# Patient Record
Sex: Female | Born: 1965 | Race: White | Hispanic: Yes | Marital: Single | Smoking: Former smoker
Health system: Southern US, Community
[De-identification: ages and names within clinical notes are randomized; demographics above are authoritative.]

## PROBLEM LIST (undated history)

## (undated) DIAGNOSIS — I10 Essential (primary) hypertension: Secondary | ICD-10-CM

## (undated) DIAGNOSIS — E7800 Pure hypercholesterolemia, unspecified: Secondary | ICD-10-CM

## (undated) DIAGNOSIS — O039 Complete or unspecified spontaneous abortion without complication: Secondary | ICD-10-CM

## (undated) DIAGNOSIS — L0291 Cutaneous abscess, unspecified: Secondary | ICD-10-CM

## (undated) DIAGNOSIS — E119 Type 2 diabetes mellitus without complications: Secondary | ICD-10-CM

## (undated) DIAGNOSIS — Z9109 Other allergy status, other than to drugs and biological substances: Secondary | ICD-10-CM

## (undated) HISTORY — PX: BREAST BIOPSY: SHX20

## (undated) HISTORY — PX: ABSCESS DRAINAGE: SHX1119

## (undated) HISTORY — DX: Complete or unspecified spontaneous abortion without complication: O03.9

---

## 2001-04-28 ENCOUNTER — Encounter: Admission: RE | Admit: 2001-04-28 | Discharge: 2001-04-28 | Payer: Self-pay | Admitting: Obstetrics & Gynecology

## 2003-07-24 ENCOUNTER — Encounter (INDEPENDENT_AMBULATORY_CARE_PROVIDER_SITE_OTHER): Payer: Self-pay | Admitting: Specialist

## 2003-07-24 ENCOUNTER — Encounter: Admission: RE | Admit: 2003-07-24 | Discharge: 2003-07-24 | Payer: Self-pay | Admitting: Obstetrics and Gynecology

## 2003-07-24 ENCOUNTER — Other Ambulatory Visit: Admission: RE | Admit: 2003-07-24 | Discharge: 2003-07-24 | Payer: Self-pay | Admitting: Obstetrics & Gynecology

## 2003-11-14 ENCOUNTER — Emergency Department (HOSPITAL_COMMUNITY): Admission: EM | Admit: 2003-11-14 | Discharge: 2003-11-14 | Payer: Self-pay | Admitting: Emergency Medicine

## 2003-11-27 ENCOUNTER — Emergency Department (HOSPITAL_COMMUNITY): Admission: EM | Admit: 2003-11-27 | Discharge: 2003-11-27 | Payer: Self-pay | Admitting: Emergency Medicine

## 2004-08-11 ENCOUNTER — Ambulatory Visit: Payer: Self-pay | Admitting: Internal Medicine

## 2004-08-12 ENCOUNTER — Ambulatory Visit: Payer: Self-pay | Admitting: *Deleted

## 2004-12-08 ENCOUNTER — Ambulatory Visit: Payer: Self-pay | Admitting: Internal Medicine

## 2004-12-17 ENCOUNTER — Ambulatory Visit: Payer: Self-pay | Admitting: Family Medicine

## 2005-01-27 ENCOUNTER — Ambulatory Visit (HOSPITAL_COMMUNITY): Admission: RE | Admit: 2005-01-27 | Discharge: 2005-01-27 | Payer: Self-pay | Admitting: General Surgery

## 2005-01-27 ENCOUNTER — Encounter (INDEPENDENT_AMBULATORY_CARE_PROVIDER_SITE_OTHER): Payer: Self-pay | Admitting: *Deleted

## 2005-02-10 ENCOUNTER — Ambulatory Visit: Payer: Self-pay | Admitting: Internal Medicine

## 2005-05-04 ENCOUNTER — Encounter: Admission: RE | Admit: 2005-05-04 | Discharge: 2005-05-04 | Payer: Self-pay | Admitting: General Surgery

## 2005-12-07 ENCOUNTER — Ambulatory Visit: Payer: Self-pay | Admitting: Family Medicine

## 2005-12-21 ENCOUNTER — Ambulatory Visit: Payer: Self-pay | Admitting: Family Medicine

## 2006-03-09 ENCOUNTER — Ambulatory Visit: Payer: Self-pay | Admitting: Internal Medicine

## 2006-05-11 ENCOUNTER — Ambulatory Visit: Payer: Self-pay | Admitting: Internal Medicine

## 2006-05-12 ENCOUNTER — Ambulatory Visit (HOSPITAL_COMMUNITY): Admission: RE | Admit: 2006-05-12 | Discharge: 2006-05-12 | Payer: Self-pay | Admitting: Internal Medicine

## 2006-05-18 ENCOUNTER — Ambulatory Visit: Payer: Self-pay | Admitting: Internal Medicine

## 2006-06-17 ENCOUNTER — Ambulatory Visit: Payer: Self-pay | Admitting: Obstetrics & Gynecology

## 2006-08-22 ENCOUNTER — Emergency Department (HOSPITAL_COMMUNITY): Admission: EM | Admit: 2006-08-22 | Discharge: 2006-08-22 | Payer: Self-pay | Admitting: *Deleted

## 2006-10-01 ENCOUNTER — Ambulatory Visit: Payer: Self-pay | Admitting: Internal Medicine

## 2006-10-29 ENCOUNTER — Ambulatory Visit: Payer: Self-pay | Admitting: Internal Medicine

## 2006-11-05 ENCOUNTER — Ambulatory Visit: Payer: Self-pay | Admitting: Gynecology

## 2006-11-25 ENCOUNTER — Ambulatory Visit: Payer: Self-pay | Admitting: Internal Medicine

## 2007-01-31 ENCOUNTER — Ambulatory Visit: Payer: Self-pay | Admitting: Internal Medicine

## 2007-02-02 ENCOUNTER — Ambulatory Visit: Payer: Self-pay | Admitting: Obstetrics and Gynecology

## 2007-02-23 ENCOUNTER — Encounter (INDEPENDENT_AMBULATORY_CARE_PROVIDER_SITE_OTHER): Payer: Self-pay | Admitting: *Deleted

## 2007-03-14 ENCOUNTER — Ambulatory Visit: Payer: Self-pay | Admitting: Internal Medicine

## 2007-03-15 ENCOUNTER — Ambulatory Visit (HOSPITAL_COMMUNITY): Admission: RE | Admit: 2007-03-15 | Discharge: 2007-03-15 | Payer: Self-pay | Admitting: Internal Medicine

## 2007-03-16 ENCOUNTER — Ambulatory Visit: Payer: Self-pay | Admitting: Internal Medicine

## 2007-04-18 ENCOUNTER — Inpatient Hospital Stay (HOSPITAL_COMMUNITY): Admission: AD | Admit: 2007-04-18 | Discharge: 2007-04-18 | Payer: Self-pay | Admitting: Obstetrics & Gynecology

## 2007-04-18 ENCOUNTER — Ambulatory Visit: Payer: Self-pay | Admitting: Internal Medicine

## 2007-05-17 ENCOUNTER — Ambulatory Visit (HOSPITAL_COMMUNITY): Admission: RE | Admit: 2007-05-17 | Discharge: 2007-05-17 | Payer: Self-pay | Admitting: Family Medicine

## 2007-06-08 ENCOUNTER — Ambulatory Visit: Payer: Self-pay | Admitting: Internal Medicine

## 2007-11-24 ENCOUNTER — Ambulatory Visit: Payer: Self-pay | Admitting: Internal Medicine

## 2008-02-08 ENCOUNTER — Ambulatory Visit: Payer: Self-pay | Admitting: Internal Medicine

## 2008-02-21 ENCOUNTER — Ambulatory Visit: Payer: Self-pay | Admitting: Internal Medicine

## 2008-06-29 ENCOUNTER — Ambulatory Visit (HOSPITAL_COMMUNITY): Admission: RE | Admit: 2008-06-29 | Discharge: 2008-06-29 | Payer: Self-pay | Admitting: Family Medicine

## 2008-07-04 ENCOUNTER — Encounter: Admission: RE | Admit: 2008-07-04 | Discharge: 2008-07-04 | Payer: Self-pay | Admitting: Family Medicine

## 2008-09-21 ENCOUNTER — Ambulatory Visit (HOSPITAL_COMMUNITY): Admission: RE | Admit: 2008-09-21 | Discharge: 2008-09-21 | Payer: Self-pay | Admitting: General Surgery

## 2008-09-21 ENCOUNTER — Encounter (INDEPENDENT_AMBULATORY_CARE_PROVIDER_SITE_OTHER): Payer: Self-pay | Admitting: General Surgery

## 2009-01-02 ENCOUNTER — Ambulatory Visit: Payer: Self-pay | Admitting: Internal Medicine

## 2009-01-04 ENCOUNTER — Ambulatory Visit: Payer: Self-pay | Admitting: Internal Medicine

## 2009-04-22 ENCOUNTER — Ambulatory Visit: Payer: Self-pay | Admitting: Internal Medicine

## 2009-05-21 ENCOUNTER — Emergency Department (HOSPITAL_COMMUNITY): Admission: EM | Admit: 2009-05-21 | Discharge: 2009-05-21 | Payer: Self-pay | Admitting: Emergency Medicine

## 2009-06-08 ENCOUNTER — Emergency Department (HOSPITAL_COMMUNITY): Admission: EM | Admit: 2009-06-08 | Discharge: 2009-06-08 | Payer: Self-pay | Admitting: Family Medicine

## 2009-06-21 ENCOUNTER — Inpatient Hospital Stay (HOSPITAL_COMMUNITY): Admission: AD | Admit: 2009-06-21 | Discharge: 2009-06-22 | Payer: Self-pay | Admitting: Obstetrics & Gynecology

## 2009-06-25 ENCOUNTER — Emergency Department (HOSPITAL_COMMUNITY): Admission: EM | Admit: 2009-06-25 | Discharge: 2009-06-25 | Payer: Self-pay | Admitting: Family Medicine

## 2009-07-02 ENCOUNTER — Inpatient Hospital Stay (HOSPITAL_COMMUNITY): Admission: AD | Admit: 2009-07-02 | Discharge: 2009-07-02 | Payer: Self-pay | Admitting: Obstetrics & Gynecology

## 2009-07-14 ENCOUNTER — Inpatient Hospital Stay (HOSPITAL_COMMUNITY): Admission: AD | Admit: 2009-07-14 | Discharge: 2009-07-14 | Payer: Self-pay | Admitting: Obstetrics and Gynecology

## 2009-07-14 ENCOUNTER — Ambulatory Visit: Payer: Self-pay | Admitting: Obstetrics and Gynecology

## 2009-07-25 ENCOUNTER — Ambulatory Visit: Payer: Self-pay | Admitting: Obstetrics and Gynecology

## 2009-07-25 LAB — CONVERTED CEMR LAB: hCG, Beta Chain, Quant, S: 2.1 milliintl units/mL

## 2009-08-06 ENCOUNTER — Ambulatory Visit: Payer: Self-pay | Admitting: Internal Medicine

## 2009-08-12 ENCOUNTER — Encounter: Admission: RE | Admit: 2009-08-12 | Discharge: 2009-08-12 | Payer: Self-pay | Admitting: Family Medicine

## 2009-09-03 ENCOUNTER — Emergency Department (HOSPITAL_COMMUNITY): Admission: EM | Admit: 2009-09-03 | Discharge: 2009-09-03 | Payer: Self-pay | Admitting: Family Medicine

## 2009-09-25 ENCOUNTER — Emergency Department (HOSPITAL_COMMUNITY): Admission: EM | Admit: 2009-09-25 | Discharge: 2009-09-25 | Payer: Self-pay | Admitting: Family Medicine

## 2009-12-08 ENCOUNTER — Ambulatory Visit: Payer: Self-pay | Admitting: Obstetrics and Gynecology

## 2009-12-08 ENCOUNTER — Inpatient Hospital Stay (HOSPITAL_COMMUNITY): Admission: AD | Admit: 2009-12-08 | Discharge: 2009-12-08 | Payer: Self-pay | Admitting: Family Medicine

## 2009-12-12 ENCOUNTER — Emergency Department (HOSPITAL_COMMUNITY): Admission: EM | Admit: 2009-12-12 | Discharge: 2009-12-12 | Payer: Self-pay | Admitting: Emergency Medicine

## 2009-12-26 ENCOUNTER — Encounter: Admission: RE | Admit: 2009-12-26 | Discharge: 2010-02-17 | Payer: Self-pay | Admitting: Orthopedic Surgery

## 2010-01-02 ENCOUNTER — Ambulatory Visit: Payer: Self-pay | Admitting: Internal Medicine

## 2010-03-18 ENCOUNTER — Ambulatory Visit: Payer: Self-pay | Admitting: Internal Medicine

## 2010-03-18 ENCOUNTER — Encounter (INDEPENDENT_AMBULATORY_CARE_PROVIDER_SITE_OTHER): Payer: Self-pay | Admitting: Internal Medicine

## 2010-03-18 LAB — CONVERTED CEMR LAB
ALT: 39 units/L — ABNORMAL HIGH (ref 0–35)
AST: 27 units/L (ref 0–37)
Albumin: 4.3 g/dL (ref 3.5–5.2)
Alkaline Phosphatase: 80 units/L (ref 39–117)
BUN: 10 mg/dL (ref 6–23)
Basophils Absolute: 0 10*3/uL (ref 0.0–0.1)
Basophils Relative: 0 % (ref 0–1)
CO2: 27 meq/L (ref 19–32)
Calcium: 9.3 mg/dL (ref 8.4–10.5)
Chloride: 102 meq/L (ref 96–112)
Creatinine, Ser: 0.57 mg/dL (ref 0.40–1.20)
Eosinophils Absolute: 0.2 10*3/uL (ref 0.0–0.7)
Eosinophils Relative: 3 % (ref 0–5)
Glucose, Bld: 115 mg/dL — ABNORMAL HIGH (ref 70–99)
HCT: 42.9 % (ref 36.0–46.0)
Hemoglobin: 13.6 g/dL (ref 12.0–15.0)
Lymphocytes Relative: 39 % (ref 12–46)
Lymphs Abs: 2.6 10*3/uL (ref 0.7–4.0)
MCHC: 31.7 g/dL (ref 30.0–36.0)
MCV: 91.9 fL (ref 78.0–100.0)
Monocytes Absolute: 0.5 10*3/uL (ref 0.1–1.0)
Monocytes Relative: 8 % (ref 3–12)
Neutro Abs: 3.3 10*3/uL (ref 1.7–7.7)
Neutrophils Relative %: 50 % (ref 43–77)
Platelets: 346 10*3/uL (ref 150–400)
Potassium: 4.3 meq/L (ref 3.5–5.3)
RBC: 4.67 M/uL (ref 3.87–5.11)
RDW: 13.5 % (ref 11.5–15.5)
Sodium: 141 meq/L (ref 135–145)
TSH: 1.865 microintl units/mL (ref 0.350–4.500)
Total Bilirubin: 0.3 mg/dL (ref 0.3–1.2)
Total Protein: 7.2 g/dL (ref 6.0–8.3)
WBC: 6.6 10*3/uL (ref 4.0–10.5)

## 2010-05-11 ENCOUNTER — Emergency Department (HOSPITAL_COMMUNITY)
Admission: EM | Admit: 2010-05-11 | Discharge: 2010-05-11 | Payer: Self-pay | Source: Home / Self Care | Admitting: Emergency Medicine

## 2010-05-19 ENCOUNTER — Emergency Department (HOSPITAL_COMMUNITY)
Admission: EM | Admit: 2010-05-19 | Discharge: 2010-05-19 | Payer: Self-pay | Source: Home / Self Care | Admitting: Emergency Medicine

## 2010-06-29 ENCOUNTER — Encounter: Payer: Self-pay | Admitting: Internal Medicine

## 2010-07-17 ENCOUNTER — Other Ambulatory Visit (HOSPITAL_COMMUNITY): Payer: Self-pay | Admitting: Family Medicine

## 2010-07-17 DIAGNOSIS — Z0000 Encounter for general adult medical examination without abnormal findings: Secondary | ICD-10-CM

## 2010-07-17 DIAGNOSIS — Z1231 Encounter for screening mammogram for malignant neoplasm of breast: Secondary | ICD-10-CM

## 2010-08-12 IMAGING — MG MM DIGITAL DIAGNOSTIC BILAT
4 series · 4 of 4 positions shown · non-contrast
Comparison: 06/29/2008

CLINICAL DATA: The patient underwent stereotactic core needle
biopsy at [facility].  She states that the results
were benign.

DIGITAL DIAGNOSTIC BILATERAL MAMMOGRAM WITH CAD

[R CC]
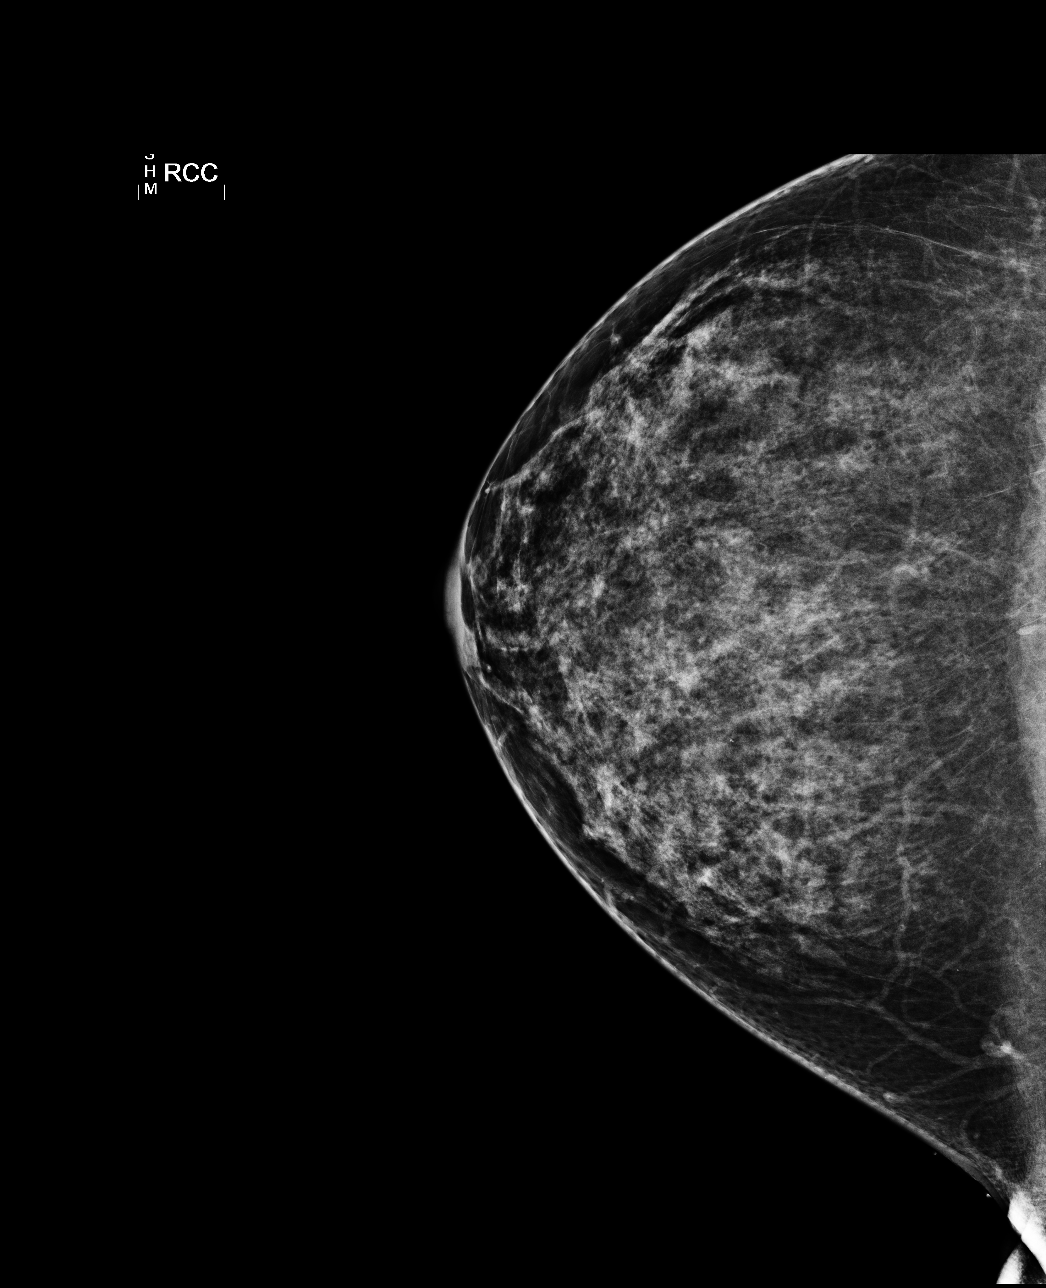

[L CC]
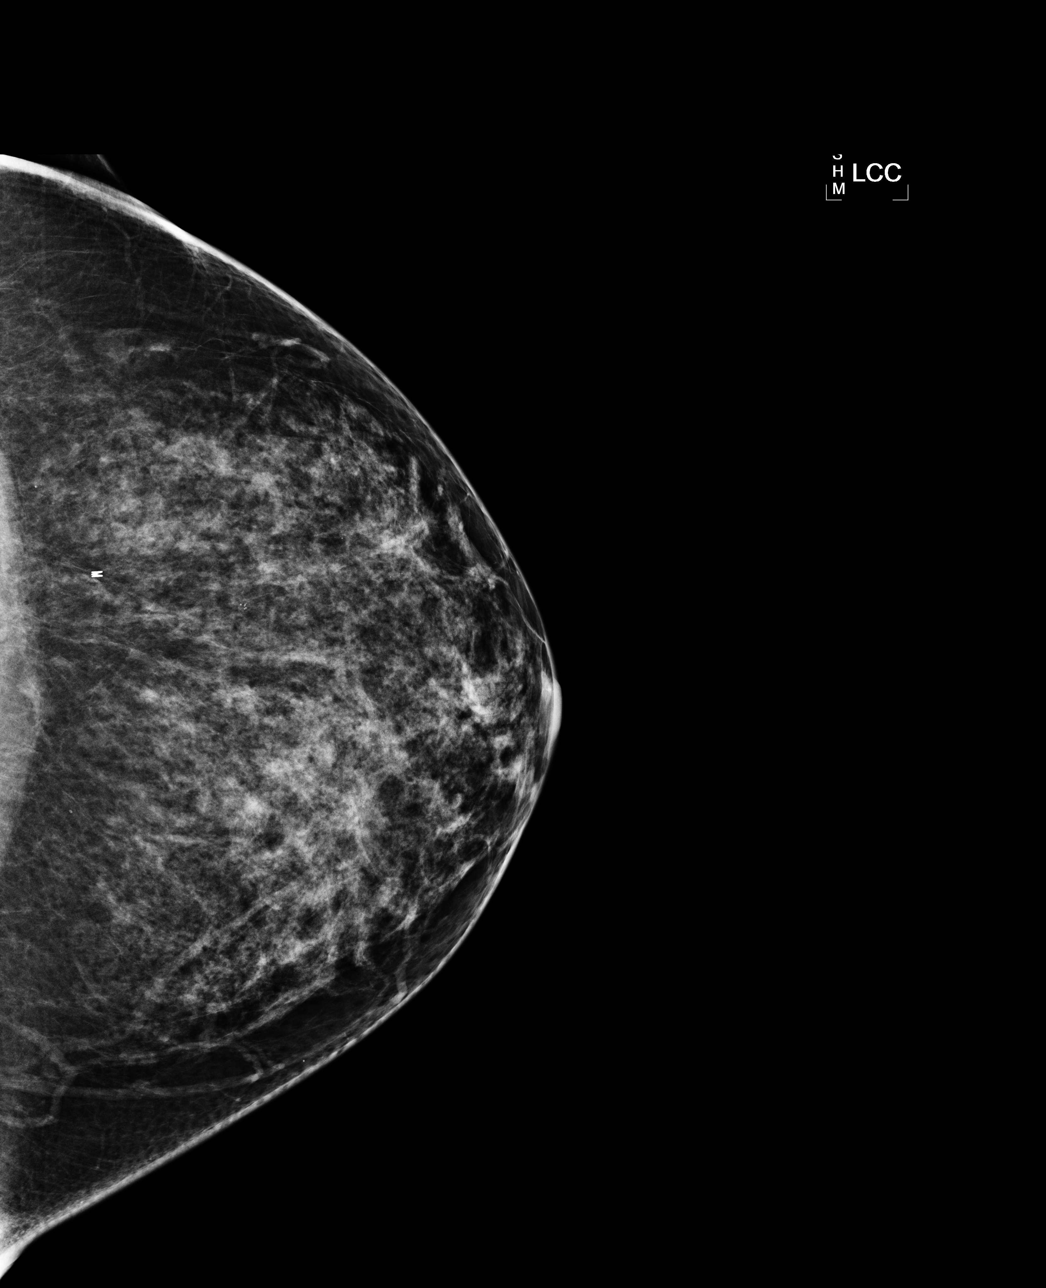

[L MLO]
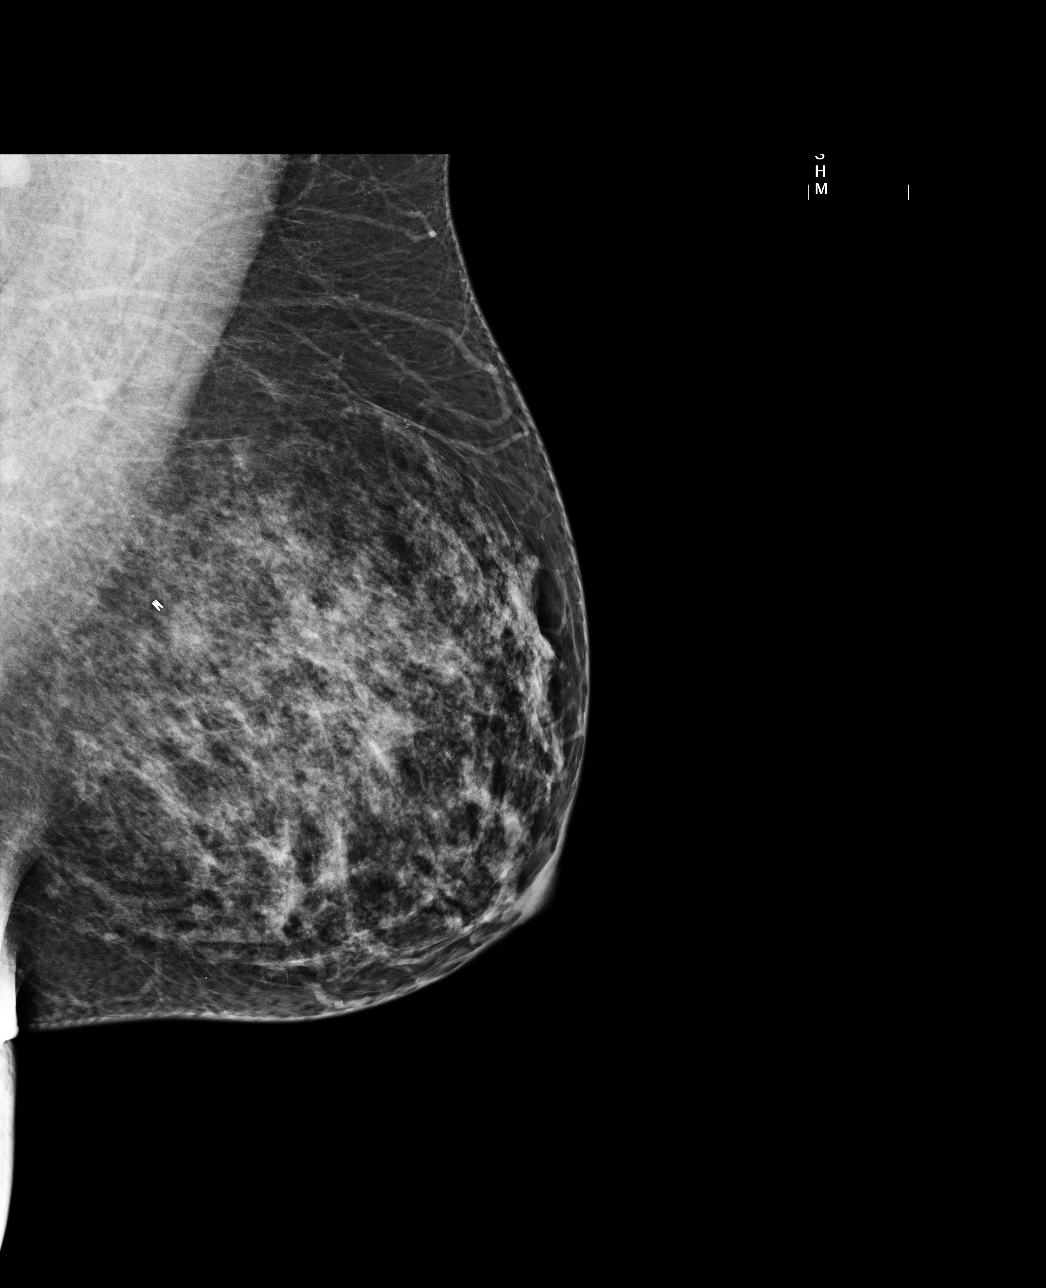

[R MLO]
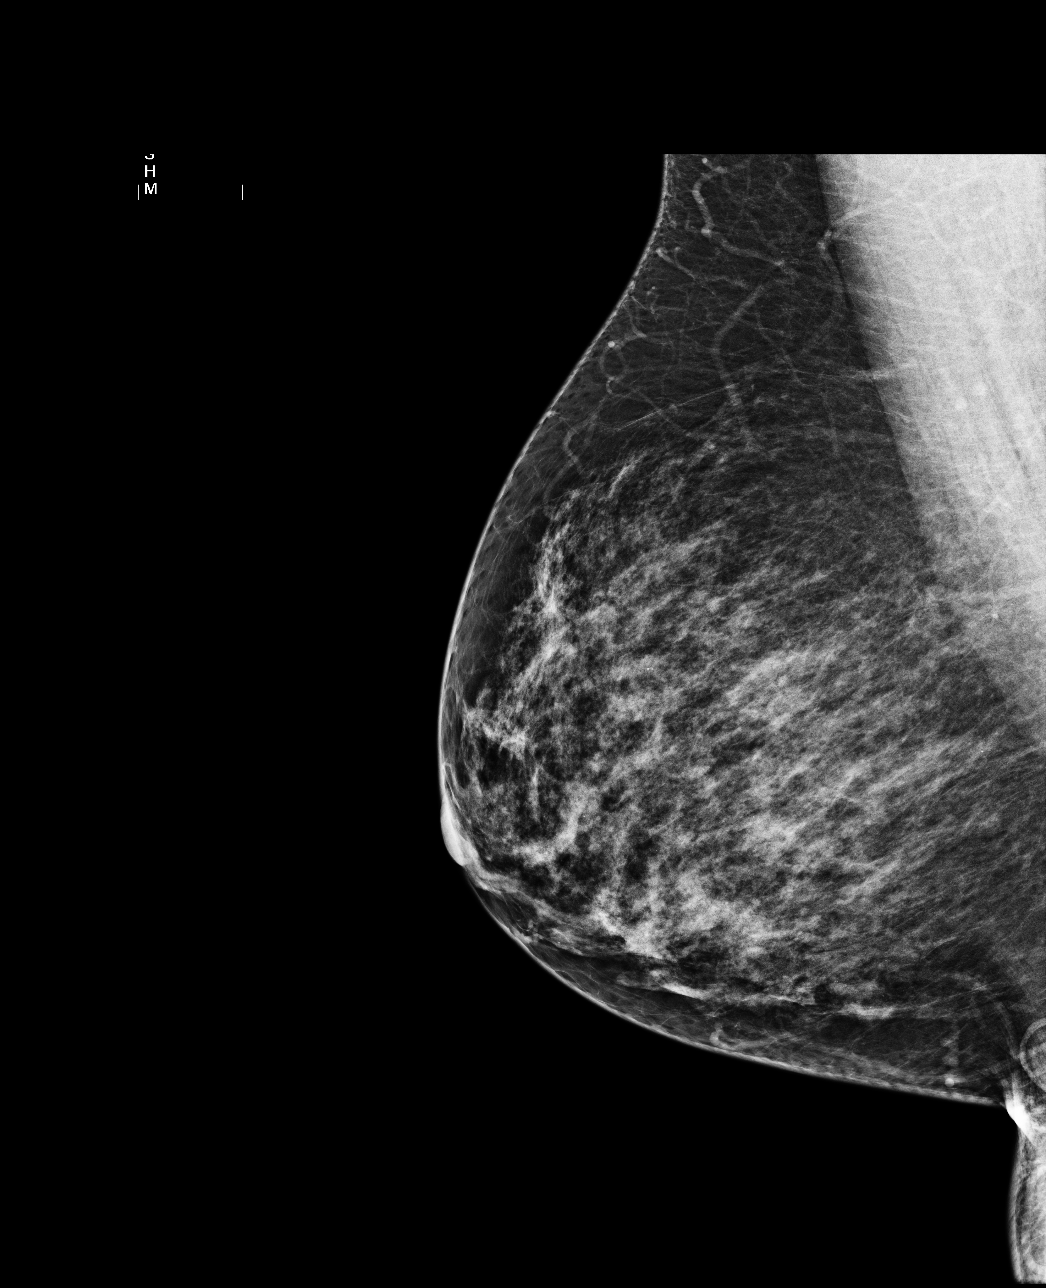

[4 of 4 positions shown; findings below may reference images not displayed]

FINDINGS: There are scattered fibroglandular densities.  There is
no dominant mass, architectural distortion or calcification to
suggest malignancy.  The clip marking the prior stereotactic biopsy
site in the outer portion of the right breast is seen.
Mammographic images were processed with CAD.
IMPRESSION: No mammographic evidence of malignancy.  Yearly screening
mammography is suggested.

BI-RADS CATEGORY 1:  Negative.

## 2010-08-18 ENCOUNTER — Ambulatory Visit (HOSPITAL_COMMUNITY): Admission: RE | Admit: 2010-08-18 | Payer: Self-pay | Source: Ambulatory Visit

## 2010-08-19 LAB — POCT PREGNANCY, URINE: Preg Test, Ur: NEGATIVE

## 2010-08-24 LAB — URINALYSIS, ROUTINE W REFLEX MICROSCOPIC
Bilirubin Urine: NEGATIVE
Glucose, UA: NEGATIVE mg/dL
Ketones, ur: 15 mg/dL — AB
Leukocytes, UA: NEGATIVE
Nitrite: NEGATIVE
Protein, ur: NEGATIVE mg/dL
Specific Gravity, Urine: >1.03 — ABNORMAL HIGH (ref 1.005–1.030)
Urobilinogen, UA: 0.2 mg/dL (ref 0.0–1.0)
pH: 5.5 (ref 5.0–8.0)

## 2010-08-24 LAB — COMPREHENSIVE METABOLIC PANEL
ALT: 25 U/L (ref 0–35)
AST: 22 U/L (ref 0–37)
Albumin: 3.8 g/dL (ref 3.5–5.2)
Alkaline Phosphatase: 69 U/L (ref 39–117)
BUN: 11 mg/dL (ref 6–23)
CO2: 25 mEq/L (ref 19–32)
Calcium: 9 mg/dL (ref 8.4–10.5)
Chloride: 102 mEq/L (ref 96–112)
Creatinine, Ser: 0.52 mg/dL (ref 0.4–1.2)
GFR calc Af Amer: >60 mL/min (ref >60)
GFR calc non Af Amer: >60 mL/min (ref >60)
Glucose, Bld: 122 mg/dL — ABNORMAL HIGH (ref 70–99)
Potassium: 3.4 mEq/L — ABNORMAL LOW (ref 3.5–5.1)
Sodium: 134 mEq/L — ABNORMAL LOW (ref 135–145)
Total Bilirubin: 0.4 mg/dL (ref 0.3–1.2)
Total Protein: 7 g/dL (ref 6.0–8.3)

## 2010-08-24 LAB — SAMPLE TO BLOOD BANK

## 2010-08-24 LAB — CBC
HCT: 39.6 % (ref 36.0–46.0)
HCT: 36.3 % (ref 36.0–46.0)
Hemoglobin: 13.4 g/dL (ref 12.0–15.0)
Hemoglobin: 12.1 g/dL (ref 12.0–15.0)
MCH: 30.1 pg (ref 26.0–34.0)
MCHC: 33.8 g/dL (ref 30.0–36.0)
MCHC: 33.4 g/dL (ref 30.0–36.0)
MCHC: 33.4 g/dL (ref 30.0–36.0)
MCV: 89.2 fL (ref 78.0–100.0)
MCV: 88.6 fL (ref 78.0–100.0)
Platelets: 303 10*3/uL (ref 150–400)
Platelets: 318 10*3/uL (ref 150–400)
RBC: 4.44 MIL/uL (ref 3.87–5.11)
RDW: 13.2 % (ref 11.5–15.5)
RDW: 12.6 % (ref 11.5–15.5)
RDW: 13 % (ref 11.5–15.5)
WBC: 8.7 10*3/uL (ref 4.0–10.5)

## 2010-08-24 LAB — BASIC METABOLIC PANEL
BUN: 9 mg/dL (ref 6–23)
Chloride: 101 mEq/L (ref 96–112)
Creatinine, Ser: 0.48 mg/dL (ref 0.4–1.2)
Glucose, Bld: 148 mg/dL — ABNORMAL HIGH (ref 70–99)

## 2010-08-24 LAB — SEDIMENTATION RATE: Sed Rate: 11 mm/hr (ref 0–22)

## 2010-08-24 LAB — WET PREP, GENITAL

## 2010-08-24 LAB — URINE MICROSCOPIC-ADD ON

## 2010-08-24 LAB — POCT PREGNANCY, URINE
Preg Test, Ur: NEGATIVE
Preg Test, Ur: POSITIVE

## 2010-08-24 LAB — GC/CHLAMYDIA PROBE AMP, GENITAL: GC Probe Amp, Genital: NEGATIVE

## 2010-08-26 LAB — POCT URINALYSIS DIP (DEVICE)
Glucose, UA: NEGATIVE mg/dL
Nitrite: NEGATIVE

## 2010-08-26 LAB — POCT PREGNANCY, URINE: Preg Test, Ur: NEGATIVE

## 2010-08-27 LAB — CBC
Platelets: 292 10*3/uL (ref 150–400)
RBC: 4.21 MIL/uL (ref 3.87–5.11)
WBC: 9.8 10*3/uL (ref 4.0–10.5)

## 2010-08-27 LAB — DIFFERENTIAL
Basophils Relative: 0 % (ref 0–1)
Lymphs Abs: 2.1 10*3/uL (ref 0.7–4.0)
Monocytes Relative: 6 % (ref 3–12)
Neutro Abs: 7 10*3/uL (ref 1.7–7.7)
Neutrophils Relative %: 71 % (ref 43–77)

## 2010-09-01 LAB — POCT I-STAT, CHEM 8
Chloride: 104 mEq/L (ref 96–112)
Creatinine, Ser: 0.4 mg/dL (ref 0.4–1.2)
Glucose, Bld: 107 mg/dL — ABNORMAL HIGH (ref 70–99)
HCT: 43 % (ref 36.0–46.0)
Hemoglobin: 14.6 g/dL (ref 12.0–15.0)
Potassium: 3.9 mEq/L (ref 3.5–5.1)
Sodium: 139 mEq/L (ref 135–145)

## 2010-09-17 LAB — CBC
HCT: 38.5 % (ref 36.0–46.0)
Hemoglobin: 13.3 g/dL (ref 12.0–15.0)
RBC: 4.42 MIL/uL (ref 3.87–5.11)
RDW: 13 % (ref 11.5–15.5)
WBC: 6.2 10*3/uL (ref 4.0–10.5)

## 2010-09-17 LAB — BASIC METABOLIC PANEL
Calcium: 9.2 mg/dL (ref 8.4–10.5)
GFR calc Af Amer: >60 mL/min (ref >60)
GFR calc non Af Amer: >60 mL/min (ref >60)
Glucose, Bld: 98 mg/dL (ref 70–99)
Potassium: 4 mEq/L (ref 3.5–5.1)
Sodium: 138 mEq/L (ref 135–145)

## 2010-10-02 ENCOUNTER — Other Ambulatory Visit (HOSPITAL_COMMUNITY): Payer: Self-pay | Admitting: Family Medicine

## 2010-10-02 DIAGNOSIS — Z1231 Encounter for screening mammogram for malignant neoplasm of breast: Secondary | ICD-10-CM

## 2010-10-06 ENCOUNTER — Ambulatory Visit (HOSPITAL_COMMUNITY)
Admission: RE | Admit: 2010-10-06 | Discharge: 2010-10-06 | Disposition: A | Discharge status: Home / Self Care | Payer: Self-pay | Source: Ambulatory Visit | Attending: Family Medicine | Admitting: Family Medicine

## 2010-10-06 DIAGNOSIS — Z1231 Encounter for screening mammogram for malignant neoplasm of breast: Secondary | ICD-10-CM | POA: Insufficient documentation

## 2010-10-21 NOTE — Op Note (Signed)
NAME:  Alexis Mooney, Alexis Mooney      ACCOUNT NO.:  [id number]   MEDICAL RECORD NO.:  [id number]          PATIENT TYPE:  AMB   LOCATION:  SDS                          FACILITY:  [facility]   PHYSICIAN:  Juanetta Gosling, MDDATE OF BIRTH:  09-29-1965   DATE OF PROCEDURE:  09/21/2008  DATE OF DISCHARGE:  09/21/2008                               OPERATIVE REPORT   PREOPERATIVE DIAGNOSIS:  Chronic left axillary hidradenitis.   POSTOPERATIVE DIAGNOSIS:  Chronic left axillary hidradenitis.   PROCEDURE:  Excision of left axillary hidradenitis.   SURGEON:  Troy Sine. Dwain Sarna, MD   ASSISTANT:  None.   ANESTHESIA:  General.   FINDINGS:  Chronic left axillary hidradenitis.   SPECIMENS:  Left axillary skin and subcutaneous tissue to pathology.   ESTIMATED BLOOD LOSS:  Minimal.   COMPLICATIONS:  None.   DRAINS:  Penrose drain to left axilla.   DISPOSITION:  To recovery room in stable condition.   SUPERVISING ANESTHESIOLOGIST:  Kaylyn Layer. Michelle Piper, MD   HISTORY OF PRESENT ILLNESS:  [name] is a 45 year old lady with  multiple episodes of hidradenitis in her left arm is now flaring every  week as worse with her periods.  She and I had several discussion about  treatments for hidradenitis and she has elected to undergo excision of  her chronic left axillary hidradenitis.  We also discussed her  postoperative course as well as her ability to work given that she is a  Designer, fashion/clothing, who works with her upper extremities.  We discussed the  risks, benefits, and indication of the operation prior to beginning.   PROCEDURE:  After informed consent was obtained, the patient was first  administered 1 g of intravenous vancomycin.  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  Sequential compression  devices were placed on lower extremities prior to operation.  She had  underwent general endotracheal  anesthesia without complication.  Her  left axilla was then prepped with ChloraPrep and appropriate time was  allowed to pass, this was then draped in the standard sterile surgical  fashion.  Surgical time-out was then performed.  An elliptical incision was then made in the area that her hidradenitis  was occurring.  This dissection was carried out down to the level of the  axillary fascia and the entire area of skin and subcutaneous tissue was  removed to normal-appearing tissue.  There were no other abnormal  appearing tissue present upon completion.  This area was irrigated  copiously.  Hemostasis was obtained with electrocautery.  I then closed  this area in several layers with 2-0 Vicryl sutures.  I placed a Penrose  drain in the deep space of this as well and brought this out through the  posterior portion of the incision.  The skin was then closed with 2-0  nylon sutures.  A dressing was placed over this.  She tolerated this  well, was extubated in the operating room and transferred to recovery  room in stable condition.      Juanetta Gosling, MD  Electronically Signed     MCW/MEDQ  D:  09/21/2008  T:  09/22/2008  Job:  [id number]

## 2010-10-21 NOTE — Group Therapy Note (Signed)
NAMEKARLIAH, KOWALCHUK NO.:  [id number]   MEDICAL RECORD NO.:  [id number]          PATIENT TYPE:  WOC   LOCATION:  [facility]                   FACILITY:  [facility]   PHYSICIAN:  Argentina Donovan, MD        DATE OF BIRTH:  [birth year]   DATE OF SERVICE:  02/02/2007                                  CLINIC NOTE   The patient is a 45 year old English speaking Hispanic female,  nulligravida who is concerned about getting pregnant.  She says she has  regular periods but was told in [place] and [place] and [place] and  [place] that she had hormone problems.  She is overweight at 228 lb at 6  feet, 4 inches and examination at this time was deferred.  We have told  her that there is a good chance that she is ovulating regularly and I  talked to her in detail about the temperature chart to see whether that  was correct.  She said she had had thyroid problems in the past.  We  gave her a slip to go to [facility] and get a TSH and instructed her  in the use of the temperature chart, to bring this back in 2 months and  we will go over it and see if there is any problem with that.  She asked  why she could not get pregnant if she is ovulating.  I said there are  many other reasons.  One thing is her partner, apparently, has never had  any children and so I told her that the next step would probably be to  get a sperm evaluation.   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           ______________________________  Argentina Donovan, MD     PR/MEDQ  D:  02/02/2007  T:  02/03/2007  Job:  [id number]

## 2010-10-24 NOTE — Group Therapy Note (Signed)
NAMEYESICA, Alexis Mooney NO.:  [id number]   MEDICAL RECORD NO.:  [id number]          PATIENT TYPE:  WOC   LOCATION:  [facility]                   FACILITY:  [facility]   PHYSICIAN:  Dorthula Perfect, MD     DATE OF BIRTH:  [birth year]   DATE OF SERVICE:                                  CLINIC NOTE   A 45 year old Hispanic female, nulligravida, here for evaluation of an  abscess in the left axillary area.  She has apparently been on birth  control pills for the past 15 years because of a history of irregular  menstrual periods.  Her last menstrual period started December 14 and  she did not resume the pill.  She states that she develops these areas  in the axillary area when she is not on birth control pills.  A year or  2 ago she had surgery in the right axilla to remove similar areas.  She  had a Pap smear 1 month ago.  She is undecided as to whether or not she  wants to become pregnant at this time, but does want to have another  child.  She understands that at age 53, her time is running out.   PHYSICAL EXAM:  Height 5 feet 4 inches, weight 219, blood pressure  113/79.  Examination of the right axillary area is negative.  She has a surgical  scar there.  In the left axillary area is a 2x1 cm very tender abscess  that is not flocculent at all.  Both breasts are normal.  A pelvic examination is not performed.   DIAGNOSES:  1. Abscess left axillary area.  2. History of irregular periods.   DISPOSITION:  1. Keflex 500 mg.  2. Warm wet compresses at least 3 times a day.  3. If this area is still a problem after 2 weeks goes by, she should      get back with a Development worker, international aid.  4. She will not resume the birth control pills.  If she does not      become pregnant in the next 4 to 6 months she is to return, and      will probably have to be stimulated to ovulate.           ______________________________  Dorthula Perfect, MD     ER/MEDQ  D:  06/17/2006  T:   06/17/2006  Job:  [id number]

## 2010-10-24 NOTE — Group Therapy Note (Signed)
Alexis Mooney, STOKES NO.:  [id number]   MEDICAL RECORD NO.:  [id number]                   PATIENT TYPE:  OUT   LOCATION:  [facility]                           FACILITY:  [facility]   PHYSICIAN:  Elsie Lincoln, MD                   DATE OF BIRTH:  1966/04/11   DATE OF SERVICE:  07/24/2003                                    CLINIC NOTE   REASON FOR VISIT:  A 45 year old G0 female referred from [facility] for  follow-up of a vulvar lesion.  The patient says almost every month before  her period she gets this red, swollen bump on her right inner labia majora.  It itches and burns and then goes away.  The patient was seen here in 2002  and was diagnosed with hypertrophy of the labia minora tissue.  The patient  is worried this is cancer; however, the patient was assured that cancer  usually does not wax and wane.  It sounds more infectious or autoimmune.   PAST MEDICAL HISTORY:  Denies.   PAST SURGICAL HISTORY:  Denies.   PAST GYNECOLOGICAL HISTORY:  G0.  No abnormal Pap smear.  On birth control  pills for irregular menses, questionable PCO.  No fibroid cysts or sexually  transmitted diseases.   ALLERGIES:  LATEX.   PHYSICAL EXAMINATION:  Labia majora on the right looks like a slight scar  from chronic infection, maybe a little bit papillomatosis.  No active  lesion.  No pain at the site, no erythema.  No other lesion noted on the  labia majora or labia minora.   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   1. Punch biopsy done of the right labia majora.  2. The patient to call when lesion appears and the patient understands to be     seen on Tuesday, Thursday, or Friday.  The receptionist is to please make     this appointment when the patient calls.  If the patient cannot get     appointment she can come to the [facility].  3. Return to clinic in 6 weeks.     Elsie Lincoln, MD    KL/MEDQ  D:  07/24/2003  T:  07/24/2003  Job:  [id number]

## 2010-10-24 NOTE — Op Note (Signed)
NAMECARLOYN, Alexis Mooney      ACCOUNT NO.:  [id number]   MEDICAL RECORD NO.:  [id number]          PATIENT TYPE:  OIB   LOCATION:  NA                           FACILITY:  [facility]   PHYSICIAN:  Cherylynn Ridges, M.D.    DATE OF BIRTH:  1965-09-02   DATE OF PROCEDURE:  01/27/2005  DATE OF DISCHARGE:                                 OPERATIVE REPORT   PREOPERATIVE DIAGNOSIS:  Right axillary hydradenitis.   POSTOPERATIVE DIAGNOSIS:  Right axillary hydradenitis.   PROCEDURE:  Excision of right axillary hydradenitis.   SURGEON:  Marta Lamas. Lindie Spruce, M.D.   ANESTHESIA:  General endotracheal.   ESTIMATED BLOOD LOSS:  Less than 20 cc.   COMPLICATIONS:  None.   CONDITION:  Stable.   SPECIMENS:  Right axillary tissue and skin.   INDICATIONS FOR PROCEDURE:  The patient is a 45 year old who has had  draining right axillary sinuses for several months who now is in remission  and comes in for an elective excision.   FINDINGS:  The patient had a small sinus tract posteriorly in the axilla  which did not go deep into the tissue, and there was minimal purulent  drainage.  She did receive preoperative antibiotics.  There was an anterior  area of the axilla which was reddened but not draining which was incised in  total without disruption of the cavity.   DESCRIPTION OF PROCEDURE:  The patient was taken to the operating room and  placed on the table in the supine position.  After an adequate general  anesthetic was administered, she was prepped and draped in the usual sterile  manner with a towel roll underneath the right shoulder, exposing the right  axilla.   We marked the area of excision using a marking pen, and then we subsequently  used a #10 blade to excise an oval incision measuring approximately 2.5 cm  wide x 13 to 14 cm long.  This was taken down into the subcutaneous tissue  with electrocautery being used primarily for hemostasis.  Also, a large vein  was ligated with 3-0 Vicryl  ties.  A suture ligature was also used in the  deep portion of the subcutaneous tissue without exposing the deep axillary  veins and arteries.   Once we excised down into the deep subcutaneous tissue, we irrigated with  saline and obtained adequate hemostasis, and then we closed in several  layers.  A deep subcutaneous tissue layer of 2-0 Vicryl was placed, and  then subsequently a more superficial subcutaneous tissue layer of 3-0 Vicryl  was placed.  The skin was then reapproximated using interrupted vertical  mattress stitches of 4-0 nylon.  Intervening Steri-Strips were applied, and  a sterile dressing was applied.  All counts were correct.      Cherylynn Ridges, M.D.  Electronically Signed     JOW/MEDQ  D:  01/27/2005  T:  01/27/2005  Job:  [id number]

## 2011-01-12 ENCOUNTER — Inpatient Hospital Stay (INDEPENDENT_AMBULATORY_CARE_PROVIDER_SITE_OTHER)
Admission: RE | Admit: 2011-01-12 | Discharge: 2011-01-12 | Disposition: A | Discharge status: Home / Self Care | Payer: Self-pay | Source: Ambulatory Visit | Attending: Emergency Medicine | Admitting: Emergency Medicine

## 2011-01-12 DIAGNOSIS — N939 Abnormal uterine and vaginal bleeding, unspecified: Secondary | ICD-10-CM

## 2011-01-12 DIAGNOSIS — L732 Hidradenitis suppurativa: Secondary | ICD-10-CM

## 2011-01-12 DIAGNOSIS — W19XXXA Unspecified fall, initial encounter: Secondary | ICD-10-CM

## 2011-03-17 LAB — WET PREP, GENITAL

## 2011-03-17 LAB — GC/CHLAMYDIA PROBE AMP, GENITAL: Chlamydia, DNA Probe: NEGATIVE

## 2011-07-12 ENCOUNTER — Emergency Department (HOSPITAL_COMMUNITY)
Admission: EM | Admit: 2011-07-12 | Discharge: 2011-07-12 | Disposition: A | Discharge status: Home / Self Care | Payer: Self-pay | Source: Home / Self Care | Attending: Family Medicine | Admitting: Family Medicine

## 2011-07-12 ENCOUNTER — Emergency Department (INDEPENDENT_AMBULATORY_CARE_PROVIDER_SITE_OTHER): Payer: Self-pay

## 2011-07-12 ENCOUNTER — Encounter (HOSPITAL_COMMUNITY): Payer: Self-pay | Admitting: *Deleted

## 2011-07-12 DIAGNOSIS — IMO0002 Reserved for concepts with insufficient information to code with codable children: Secondary | ICD-10-CM

## 2011-07-12 DIAGNOSIS — L02411 Cutaneous abscess of right axilla: Secondary | ICD-10-CM

## 2011-07-12 DIAGNOSIS — M1710 Unilateral primary osteoarthritis, unspecified knee: Secondary | ICD-10-CM

## 2011-07-12 HISTORY — DX: Cutaneous abscess, unspecified: L02.91

## 2011-07-12 HISTORY — DX: Other allergy status, other than to drugs and biological substances: Z91.09

## 2011-07-12 MED ORDER — SULFAMETHOXAZOLE-TRIMETHOPRIM 800-160 MG PO TABS: 1.0000 | ORAL_TABLET | Freq: Two times a day (BID) | ORAL | Status: AC

## 2011-07-12 MED ORDER — HYDROCODONE-ACETAMINOPHEN 5-325 MG PO TABS: ORAL_TABLET | ORAL | Status: AC

## 2011-07-12 NOTE — ED Notes (Signed)
Pt with c/o right knee pain onset 2 days - pt also with ? Abscess right axillary onset today

## 2011-07-12 NOTE — ED Provider Notes (Signed)
History     CSN: [id number]  Arrival date & time 07/12/11  1450   First MD Initiated Contact with Patient 07/12/11 1500      Chief Complaint  Patient presents with  . Knee Pain  . Abscess    (Consider location/radiation/quality/duration/timing/severity/associated sxs/prior treatment) Patient is a 46 y.o. female presenting with knee pain and abscess. The history is provided by the patient.  Knee Pain This is a new problem. The current episode started 2 days ago. The problem has not changed since onset.The symptoms are aggravated by walking and exertion. The symptoms are relieved by nothing.  Abscess  This is a new problem. The current episode started today. The problem has been unchanged. The abscess is present on the right arm. It is unknown what she was exposed to.    Past Medical History  Diagnosis Date  . Pollen allergies   . Abscess     Past Surgical History  Procedure Date  . Abscess drainage     History reviewed. No pertinent family history.  History  Substance Use Topics  . Smoking status: Never Smoker   . Smokeless tobacco: Not on file  . Alcohol Use: Yes    OB History    Grav Para Term Preterm Abortions TAB SAB Ect Mult Living                  Review of Systems  Constitutional: Negative.   HENT: Negative.   Eyes: Negative.   Respiratory: Negative.   Cardiovascular: Negative.   Gastrointestinal: Negative.   Genitourinary: Negative.   Musculoskeletal: Negative.   Skin: Negative.   Neurological: Negative.     Allergies  Review of patient's allergies indicates no known allergies.  Home Medications   Current Outpatient Rx  Name Route Sig Dispense Refill  . LORATADINE 10 MG PO TABS Oral Take 10 mg by mouth daily.    . TOPROL XL PO Oral Take by mouth.      BP 126/81  Pulse 89  Temp(Src) 98.1 F (36.7 C) (Oral)  Resp 18  SpO2 97%  LMP 07/05/2011  Physical Exam  Nursing note and vitals reviewed. Constitutional: She is oriented to  person, place, and time. She appears well-developed and well-nourished.  HENT:  Head: Normocephalic and atraumatic.  Eyes: EOM are normal.  Neck: Normal range of motion.  Pulmonary/Chest: Effort normal.  Musculoskeletal: Normal range of motion.       Right knee: She exhibits normal range of motion, no swelling, no effusion, no erythema and normal meniscus. tenderness found. Medial joint line and lateral joint line tenderness noted.       Knee: negative patellar grind, negative patellar facet tenderness, positve medial and lateral joint line tenderness, negative patellar tendon tenderness, negative Lachman's, negative McMurray's; no laxity or pain with varus or valgus stress   Neurological: She is alert and oriented to person, place, and time.  Skin: Skin is warm and dry. Lesion noted.     Psychiatric: Her behavior is normal.    ED Course  Procedures (including critical care time)  Labs Reviewed - No data to display No results found.   1. Arthritis of knee   2. Abscess of axilla, right       MDM  Xray reviewed by radiologist and myself; no acute findings; given rx for TMP-SMX for axillary abscess, advised warm compresses, return if no improvement; advised ibuprofen and/or acetaminophen for pain relief        Richardo Priest, MD 08/07/11 2130

## 2011-09-18 ENCOUNTER — Other Ambulatory Visit (HOSPITAL_COMMUNITY): Payer: Self-pay | Admitting: Physician Assistant

## 2011-09-18 DIAGNOSIS — Z1231 Encounter for screening mammogram for malignant neoplasm of breast: Secondary | ICD-10-CM

## 2011-10-19 ENCOUNTER — Ambulatory Visit (HOSPITAL_COMMUNITY)
Admission: RE | Admit: 2011-10-19 | Discharge: 2011-10-19 | Disposition: A | Discharge status: Home / Self Care | Payer: Self-pay | Source: Ambulatory Visit | Attending: Physician Assistant | Admitting: Physician Assistant

## 2011-10-19 DIAGNOSIS — Z1231 Encounter for screening mammogram for malignant neoplasm of breast: Secondary | ICD-10-CM

## 2012-08-19 ENCOUNTER — Other Ambulatory Visit (HOSPITAL_COMMUNITY): Payer: Self-pay | Admitting: Nurse Practitioner

## 2012-08-19 DIAGNOSIS — Z1231 Encounter for screening mammogram for malignant neoplasm of breast: Secondary | ICD-10-CM

## 2012-10-24 ENCOUNTER — Ambulatory Visit (HOSPITAL_COMMUNITY)
Admission: RE | Admit: 2012-10-24 | Discharge: 2012-10-24 | Disposition: A | Discharge status: Home / Self Care | Payer: Self-pay | Source: Ambulatory Visit | Attending: Nurse Practitioner | Admitting: Nurse Practitioner

## 2012-10-24 DIAGNOSIS — Z1231 Encounter for screening mammogram for malignant neoplasm of breast: Secondary | ICD-10-CM

## 2013-06-14 ENCOUNTER — Encounter (HOSPITAL_COMMUNITY): Payer: Self-pay | Admitting: Emergency Medicine

## 2013-06-14 ENCOUNTER — Emergency Department (HOSPITAL_COMMUNITY)
Admission: EM | Admit: 2013-06-14 | Discharge: 2013-06-14 | Disposition: A | Discharge status: Home / Self Care | Payer: No Typology Code available for payment source | Attending: Emergency Medicine | Admitting: Emergency Medicine

## 2013-06-14 DIAGNOSIS — S339XXA Sprain of unspecified parts of lumbar spine and pelvis, initial encounter: Secondary | ICD-10-CM | POA: Insufficient documentation

## 2013-06-14 DIAGNOSIS — S39012A Strain of muscle, fascia and tendon of lower back, initial encounter: Secondary | ICD-10-CM

## 2013-06-14 DIAGNOSIS — I10 Essential (primary) hypertension: Secondary | ICD-10-CM | POA: Insufficient documentation

## 2013-06-14 DIAGNOSIS — S335XXA Sprain of ligaments of lumbar spine, initial encounter: Principal | ICD-10-CM

## 2013-06-14 DIAGNOSIS — Z872 Personal history of diseases of the skin and subcutaneous tissue: Secondary | ICD-10-CM | POA: Insufficient documentation

## 2013-06-14 DIAGNOSIS — Y92410 Unspecified street and highway as the place of occurrence of the external cause: Secondary | ICD-10-CM | POA: Insufficient documentation

## 2013-06-14 DIAGNOSIS — Y9389 Activity, other specified: Secondary | ICD-10-CM | POA: Insufficient documentation

## 2013-06-14 DIAGNOSIS — Z79899 Other long term (current) drug therapy: Secondary | ICD-10-CM | POA: Insufficient documentation

## 2013-06-14 HISTORY — DX: Essential (primary) hypertension: I10

## 2013-06-14 MED ORDER — IBUPROFEN 200 MG PO TABS
600.0000 mg | ORAL_TABLET | Freq: Once | ORAL | Status: AC
  Administered 2013-06-14: 600 mg via ORAL
  Filled 2013-06-14: qty 3

## 2013-06-14 NOTE — Discharge Instructions (Signed)
°Emergency Department Resource Guide °1) Find a Doctor and Pay Out of Pocket °Although you won't have to find out who is covered by your insurance plan, it is a good idea to ask around and get recommendations. You will then need to call the office and see if the doctor you have chosen will accept you as a new patient and what types of options they offer for patients who are self-pay. Some doctors offer discounts or will set up payment plans for their patients who do not have insurance, but you will need to ask so you aren't surprised when you get to your appointment. ° °2) Contact Your Local Health Department °Not all health departments have doctors that can see patients for sick visits, but many do, so it is worth a call to see if yours does. If you don't know where your local health department is, you can check in your phone book. The CDC also has a tool to help you locate your state's health department, and many state websites also have listings of all of their local health departments. ° °3) Find a Walk-in Clinic °If your illness is not likely to be very severe or complicated, you may want to try a walk in clinic. These are popping up all over the country in pharmacies, drugstores, and shopping centers. They're usually staffed by nurse practitioners or physician assistants that have been trained to treat common illnesses and complaints. They're usually fairly quick and inexpensive. However, if you have serious medical issues or chronic medical problems, these are probably not your best option. ° °No Primary Care Doctor: °- Call [facility] at  832-8000 - they can help you locate a primary care doctor that  accepts your insurance, provides certain services, etc. °- Physician Referral Service- 1-800-533-3463 ° °Chronic Pain Problems: °Organization         Address  Phone   Notes  °[facility]  (336) 297-2271 Patients need to be referred by their primary care doctor.  ° °Medication  Assistance: °Organization         Address  Phone   Notes  °[place] [facility] [address] °[place], [place] [address] (336) 641-8030 --Must be a resident of [place] °-- Must have NO insurance coverage whatsoever (no Medicaid/ Medicare, etc.) °-- The pt. MUST have a primary care doctor that directs their care regularly and follows them in the community °  °[facility]  (866) 331-1348   °[employer]  (888) 892-1162   ° °Agencies that provide inexpensive medical care: °Organization         Address  Phone   Notes  °[facility]  (336) 832-8035   °[facility]    (336) 832-7272   °[facility] [address] °[place], [place] [address] (336) 832-4777   °[facility] [address], °[place] (336) 271-4999   °[facility]    (336) 373-0678   °[facility]    (336) 272-1050   °[facility] ° [address], [place] Phone:  (336) 832-4444, Fax:  (336) 832-4440 Hours of Operation:  9 am - 6 pm, M-F.  Also accepts Medicaid/Medicare and self-pay.  °[facility] ° [address],  Phone: (336) 832-3150, Fax: (336) 832-3151. Hours of Operation:  8:30 am - 5:30 pm, M-F.  Also accepts Medicaid and self-pay.  °[facility] [place] [address], [place] Phone: (336) 878-6027   °[facility] [address], [place], [place] (336)723-1848, Ext. 123 Mondays & Thursdays: 7-9 AM.  First 15 patients are seen on a first come, first serve basis. °  ° °Medicaid-accepting [place] Providers: ° °Organization         Address  Phone   Notes  °[facility] [address], [place] (336) 641-2100 Also accepts self-pay patients.  °[facility] [address], [place] ° (336) 856-9996   °[facility] [address], [place]  (336) 288-8857   °[facility] [address] [place] [address], [place] (336) 299-7000   °Veita Bland [address], [place]  ° (336) 373-1557 Only accepts [place] Access Medicaid patients after they have their name applied to their card.  ° °Self-Pay (no insurance) in [place]: ° °Organization         Address  Phone   Notes  °Sickle Cell Patients, [facility] [address], [place] (336) 832-1970   °[facility] [address], [place] (336) 832-4400   °[facility] [place] ° [address],  (336) 992-4800   °[facility]/Dr. Osei-Bonsu ° [address] [place] [address], [place] or [address], [place] (336) 841-8500 Phone number for both [place] and [place] locations is the same.  °[facility] [address], [place] (336) 299-0000   °[facility] [address] [place] [address], [place] or [address] (336) 852-7530 °(336) 878-2260   °[facility] [address], [place] (336) 350-1642, phone; (336) 294-5005, fax Sees patients 1st and 3rd Saturday of every month.  Must not qualify for public or private insurance (i.e. Medicaid, Medicare, [place] Health Choice, Veterans' Benefits) • Household income should be no more than 200% of the poverty level •The clinic cannot treat you if you are pregnant or think you are pregnant • Sexually transmitted diseases are not treated at the clinic.  ° ° °Dental Care: °Organization         Address  Phone  Notes  °[place] [facility] [address], [place] (336) 641-6152 Accepts children up to age 21 who are enrolled in Medicaid or [place] Health Choice; pregnant women with a Medicaid card; and children who have applied for Medicaid or [place] Health Choice, but were declined, whose parents can pay a reduced fee at time of service.  °[place]  [facility] [place]  [address], [place] (336) 641-7733 Accepts children up to age 21 who are enrolled in Medicaid or [place] Health Choice; pregnant women with a Medicaid card; and children who have applied for Medicaid or [place] Health Choice, but were declined, whose parents can pay a reduced fee at time of service.  °[facility] ° [address], [place] (336) 641-4533 Patients are seen by appointment only. Walk-ins are not accepted. [facility] will see patients 18 years of age and older. °Monday - Tuesday (8am-5pm) °Most Wednesdays (8:30-5pm) °$30 per visit, cash only  °[facility] ° [address], [place] (336) 641-4533 Patients are seen by appointment only. Walk-ins are not accepted. [facility] will see patients 18 years of age and older. °One   Wednesday Evening (Monthly: Volunteer Based).  $30 per visit, cash only  °[facility]  (919) 537-3737 for adults; Children under age 4, call Graduate Pediatric Dentistry at (919) [phone]. Children aged 4-14, please call (919) 537-3737 to request a pediatric application. ° Dental services are provided in all areas of dental care including fillings, crowns and bridges, complete and partial dentures, implants, gum treatment, root canals, and extractions. Preventive care is also provided. Treatment is provided to both adults and children. °Patients are selected via a lottery and there is often a waiting list. °  °[facility] [address], °[place] ° (336) 763-8833 [email or web address] °  °[facility] [address], [place], [place] (336)723-1848, Ext. 123 Second and Fourth Thursday of each month, opens at 6:30 AM; Clinic ends at 9 AM.  Patients are seen on a first-come first-served basis, and a limited number are seen during each clinic.  ° °[facility] ° [address], [place], [place] (336) 723-7904    Eligibility Requirements °You must have lived in [place], or [place] counties for at least the last three months. °  You cannot be eligible for state or federal sponsored healthcare insurance, including Veterans Administration, Medicaid, or Medicare. °  You generally cannot be eligible for healthcare insurance through your employer.  °  How to apply: °Eligibility screenings are held every Tuesday and Wednesday afternoon from 1:00 pm until 4:00 pm. You do not need an appointment for the interview!  °[facility] [address], [place], [place] 336-631-2330   °[facility] [facility]  336-342-8273   °[facility]  336-703-3100   °[facility]  336-570-6415   ° °Behavioral Health Resources in the Community: °Intensive Outpatient Programs °Organization         Address  Phone  Notes  °[facility] [address], [place], [place] 336-878-6098   °[facility] [address], [place], [place] 336-832-9800   °[facility] [address], [place], [place] ° 336-882-2125   °[facility] [address],  °[place], [place] 1-800-853-5163 or 336-641-4981   °Substance Abuse Resources °Organization         Address  Phone  Notes  °[facility]  336-882-2125   °[facility]  336-784-9470   °[facility]  336-285-9073   °[facility]  336-845-3988   °Residential & Outpatient Substance Abuse Program  1-800-659-3381   °Psychological Services °Organization         Address  Phone  Notes  °[facility]  336- 832-9600   °[facility]  336- 378-7881   °[facility] [address], [place] 1-800-853-5163 or 336-641-4981   ° °Mobile Crisis Teams °Organization         Address  Phone  Notes  °[facility], Mobile Crisis Care Unit  1-877-626-1772   °Assertive °[facility] ° [address]  [place], [place] 336-834-9664   °Sharon DeEsch [address] °[place] 336-554-5454   ° °Self-Help/Support Groups °Organization         Address  Phone             Notes  °[facility] [place] - variety of support groups  336- 373-1402 Call for more information  °Narcotics Anonymous (NA), [facility] [address], °[place]  2 meetings at this location  ° °  Residential Treatment Programs °Organization         Address  Phone  Notes  °[facility] [address],    °[place]  1-866-801-8205   °[facility] ° [name], [place], [place] 704-293-8524   °[facility] [address], [place] 336-845-3988 Admissions: 8am-3pm M-F  °[facility] [address]    °[place], [place] 336-841-1104   °[facility] [address], [place], [place] 336-379-7146   °[facility] [address]  °[place], [place] 336-285-9073   °[facility] Intensive Outpatient [address], [place], [place] 336-852-3033   °[facility] [address]  °[place], [place] 1-877-615-2722 or 336-784-9470   °[facility] [address] [place], [place] 336-227-7417 Accepts Medicaid  °[facility] [address]  °[place] 1-800-659-3381 Substance Abuse/Addiction Treatment  ° °[place] Behavioral Health Resources °Organization         Address  Phone  Notes  °[facility]  (888) 581-9988   °Julie Brannon, PhD [address] [place], [place]   (336) 349-5553 or (336) 951-0000   °[facility]   [address] °[place], [place] (336) 349-4454   °[facility] [address], [place], [place] (336) 342-8316 Insurance/Medicaid/sponsorship through [facility]  °[facility] [address]                                    [place], [place] (336) 342-8316 Therapy/tele-psych/case    °[facility] [address]  ° [place], [place] (336) 349-2233    °Dr. Arfeen  (336) 349-4544   °[facility] [place]  [employer] [place] [facility] 1) [address], [place] °2) [address], [place] °3)  [address], [place] (336) 349-3220 °(336) 342-7768 ° °(336) 342-8140   °[place] [facility] (336) 342-1394 or (336) 342-3537 (After Hours)    ° ° °
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
## 2013-07-06 ENCOUNTER — Encounter (HOSPITAL_COMMUNITY): Payer: Self-pay | Admitting: Emergency Medicine

## 2013-07-06 ENCOUNTER — Emergency Department (HOSPITAL_COMMUNITY)
Admission: EM | Admit: 2013-07-06 | Discharge: 2013-07-06 | Disposition: A | Discharge status: Home / Self Care | Payer: No Typology Code available for payment source | Attending: Emergency Medicine | Admitting: Emergency Medicine

## 2013-07-06 DIAGNOSIS — Z872 Personal history of diseases of the skin and subcutaneous tissue: Secondary | ICD-10-CM | POA: Insufficient documentation

## 2013-07-06 DIAGNOSIS — G8911 Acute pain due to trauma: Secondary | ICD-10-CM | POA: Insufficient documentation

## 2013-07-06 DIAGNOSIS — Z79899 Other long term (current) drug therapy: Secondary | ICD-10-CM | POA: Insufficient documentation

## 2013-07-06 DIAGNOSIS — I10 Essential (primary) hypertension: Secondary | ICD-10-CM | POA: Insufficient documentation

## 2013-07-06 DIAGNOSIS — M25569 Pain in unspecified knee: Secondary | ICD-10-CM | POA: Insufficient documentation

## 2013-07-06 NOTE — ED Notes (Signed)
MD Sheldon at bedside.  

## 2013-07-06 NOTE — ED Notes (Signed)
Pt advised to follow up with Chiropractor for follow up with MRI.  Pt had not test or labs performed during ED visit.  Pt wrongly sent to ED by Chiropractor.  Per DR. Bernette MayersSheldon pt should be flagged in the system for no charge visit.
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
## 2013-07-06 NOTE — Discharge Instructions (Signed)
Knee Pain Knee pain can be a result of an injury or other medical conditions. Treatment will depend on the cause of your pain. HOME CARE  Only take medicine as told by your doctor.  Keep a healthy weight. Being overweight can make the knee hurt more.  Stretch before exercising or playing sports.  If there is constant knee pain, change the way you exercise. Ask your doctor for advice.  Make sure shoes fit well. Choose the right shoe for the sport or activity.  Protect your knees. Wear kneepads if needed.  Rest when you are tired. GET HELP RIGHT AWAY IF:   Your knee pain does not stop.  Your knee pain does not get better.  Your knee joint feels hot to the touch.  You have a fever. MAKE SURE YOU:   Understand these instructions.  Will watch this condition.  Will get help right away if you are not doing well or get worse. Document Released: 08/21/2008 Document Revised: 08/17/2011 Document Reviewed: 08/21/2008 ExitCare Patient Information 2014 [employer].  

## 2013-07-19 ENCOUNTER — Other Ambulatory Visit: Payer: Self-pay | Admitting: *Deleted

## 2013-07-19 DIAGNOSIS — M25561 Pain in right knee: Secondary | ICD-10-CM

## 2013-07-24 ENCOUNTER — Ambulatory Visit
Admission: RE | Admit: 2013-07-24 | Discharge: 2013-07-24 | Disposition: A | Discharge status: Home / Self Care | Payer: No Typology Code available for payment source | Source: Ambulatory Visit | Attending: *Deleted | Admitting: *Deleted

## 2013-07-24 DIAGNOSIS — M25561 Pain in right knee: Secondary | ICD-10-CM

## 2013-11-08 ENCOUNTER — Other Ambulatory Visit (HOSPITAL_COMMUNITY): Payer: Self-pay | Admitting: Nurse Practitioner

## 2013-11-08 DIAGNOSIS — Z1231 Encounter for screening mammogram for malignant neoplasm of breast: Secondary | ICD-10-CM

## 2013-11-13 ENCOUNTER — Ambulatory Visit (HOSPITAL_COMMUNITY): Payer: No Typology Code available for payment source | Attending: Nurse Practitioner

## 2013-11-16 ENCOUNTER — Ambulatory Visit (HOSPITAL_COMMUNITY)
Admission: RE | Admit: 2013-11-16 | Discharge: 2013-11-16 | Disposition: A | Discharge status: Home / Self Care | Payer: No Typology Code available for payment source | Source: Ambulatory Visit | Attending: Nurse Practitioner | Admitting: Nurse Practitioner

## 2013-11-16 DIAGNOSIS — Z1231 Encounter for screening mammogram for malignant neoplasm of breast: Secondary | ICD-10-CM

## 2013-12-04 ENCOUNTER — Ambulatory Visit: Payer: No Typology Code available for payment source | Admitting: Physical Therapy

## 2013-12-12 ENCOUNTER — Ambulatory Visit: Payer: No Typology Code available for payment source | Attending: Orthopedic Surgery

## 2013-12-12 DIAGNOSIS — M25569 Pain in unspecified knee: Secondary | ICD-10-CM | POA: Insufficient documentation

## 2013-12-12 DIAGNOSIS — M25669 Stiffness of unspecified knee, not elsewhere classified: Secondary | ICD-10-CM | POA: Insufficient documentation

## 2013-12-21 ENCOUNTER — Ambulatory Visit: Payer: No Typology Code available for payment source

## 2013-12-21 DIAGNOSIS — M25569 Pain in unspecified knee: Secondary | ICD-10-CM | POA: Diagnosis not present

## 2013-12-25 ENCOUNTER — Encounter: Payer: No Typology Code available for payment source | Admitting: Physical Therapy

## 2013-12-26 ENCOUNTER — Ambulatory Visit: Payer: No Typology Code available for payment source

## 2013-12-26 ENCOUNTER — Encounter: Payer: No Typology Code available for payment source | Admitting: Physical Therapy

## 2014-01-02 ENCOUNTER — Encounter: Payer: No Typology Code available for payment source | Admitting: Physical Therapy

## 2014-01-04 ENCOUNTER — Ambulatory Visit: Payer: No Typology Code available for payment source | Admitting: Physical Therapy

## 2014-01-04 DIAGNOSIS — M25569 Pain in unspecified knee: Secondary | ICD-10-CM | POA: Diagnosis not present

## 2014-01-18 ENCOUNTER — Ambulatory Visit: Payer: No Typology Code available for payment source | Attending: Orthopedic Surgery

## 2014-01-18 DIAGNOSIS — M25669 Stiffness of unspecified knee, not elsewhere classified: Secondary | ICD-10-CM | POA: Diagnosis not present

## 2014-01-18 DIAGNOSIS — M25569 Pain in unspecified knee: Secondary | ICD-10-CM | POA: Insufficient documentation

## 2014-01-22 ENCOUNTER — Encounter: Payer: Self-pay | Admitting: Gynecology

## 2014-01-22 ENCOUNTER — Ambulatory Visit (INDEPENDENT_AMBULATORY_CARE_PROVIDER_SITE_OTHER): Payer: Self-pay | Admitting: Gynecology

## 2014-01-22 VITALS — BP 120/76 | Ht 61.0 in | Wt 243.0 lb

## 2014-01-22 DIAGNOSIS — Z3169 Encounter for other general counseling and advice on procreation: Secondary | ICD-10-CM

## 2014-01-22 DIAGNOSIS — I10 Essential (primary) hypertension: Secondary | ICD-10-CM | POA: Insufficient documentation

## 2014-01-22 DIAGNOSIS — N951 Menopausal and female climacteric states: Secondary | ICD-10-CM

## 2014-01-22 NOTE — Progress Notes (Signed)
   The patient is a 48 year old gravida 1 para 0 AB 1 who presented to the office today for consultation. She is a new patient. Patient is receiving her medical care as well as gynecological exams and Pap smear [facility]. Patient has been married for 5 years and 2 years ago had a first trimester miscarriage. Patient has not been using any form of hormone replacement therapy. She had stated that in her teens she did suffer from oligomenorrhea. She reports normal cycles every 30 days. Patient wanted to find out what her chances were to get pregnant and what her risk OR.  We went to a detail discussion on advanced maternal age if she were to conceive to include worsening of her hypertension, diabetes, premature delivery, preterm labor as well as fetal anomalies such as Down's syndrome.  After our discussion the patient has decided that she would use condoms during the time that she would be ovulating to minimize the above-mentioned risk. She will continue to follow up at the health department and we are available if she needs to see us for any of her gynecological needs. Once again we had discussed that fertility significantly begins decrease at 6935 and by 48 years of age her chances of conception is less than 10%.

## 2014-01-25 ENCOUNTER — Ambulatory Visit: Payer: No Typology Code available for payment source

## 2014-01-25 DIAGNOSIS — M25569 Pain in unspecified knee: Secondary | ICD-10-CM | POA: Diagnosis not present

## 2014-02-01 ENCOUNTER — Ambulatory Visit: Payer: No Typology Code available for payment source | Admitting: Physical Therapy

## 2014-02-01 DIAGNOSIS — M25569 Pain in unspecified knee: Secondary | ICD-10-CM | POA: Diagnosis not present

## 2014-04-09 ENCOUNTER — Encounter: Payer: Self-pay | Admitting: Gynecology

## 2014-09-24 ENCOUNTER — Ambulatory Visit: Payer: Self-pay | Attending: Family Medicine | Admitting: Family Medicine

## 2014-09-24 ENCOUNTER — Encounter: Payer: Self-pay | Admitting: Family Medicine

## 2014-09-24 VITALS — BP 122/81 | HR 88 | Temp 97.9°F | Resp 18 | Ht 63.0 in | Wt 240.6 lb

## 2014-09-24 DIAGNOSIS — I10 Essential (primary) hypertension: Secondary | ICD-10-CM | POA: Insufficient documentation

## 2014-09-24 DIAGNOSIS — E039 Hypothyroidism, unspecified: Secondary | ICD-10-CM | POA: Insufficient documentation

## 2014-09-24 DIAGNOSIS — Z87891 Personal history of nicotine dependence: Secondary | ICD-10-CM | POA: Insufficient documentation

## 2014-09-24 DIAGNOSIS — E030 Congenital hypothyroidism with diffuse goiter: Secondary | ICD-10-CM

## 2014-09-24 DIAGNOSIS — E669 Obesity, unspecified: Secondary | ICD-10-CM | POA: Insufficient documentation

## 2014-09-24 MED ORDER — LABETALOL HCL 100 MG PO TABS: 100.0000 mg | ORAL_TABLET | Freq: Two times a day (BID) | ORAL | Status: DC

## 2014-09-24 NOTE — Progress Notes (Signed)
Patient [facility], had high blood pressure of 146/103 with a pulse of 120. Patient explains she was out of medication for blood pressure. Paient was taking lisinopril 5mg , ran abut a week ago, was not taking med daily because she didn't have money to see doctor for refills. At health fair patient was given labetalol 100mg , 1 tab BID. Patient reports having hypothyroidism. Patient worried about high cholesterol.

## 2014-09-24 NOTE — Progress Notes (Signed)
Subjective:    Patient ID: Alexis Mooney, female    DOB: 12/10/1965, 49 y.o.   MRN: [id number]  HPI  This is a 49 year old female patient who comes in to establish care after [facility] and found to have elevated blood pressure of 146/103 and a pulse of 120. She was given Labetaolol 100 mg twice daily; she endorses a previous history of hypertension and had been on lisinopril previously until she ran out. She gives me a questionable history of hypothyroidism and states she has been on medications while back in her country from the age of 49 but ever since she came to the [place] no physician has placed him on any thyroid medications and she would like to have that checked.  [facility].   Past Medical History  Diagnosis Date  . Pollen allergies   . Abscess   . Hypertension   . Miscarriage     Past Surgical History  Procedure Laterality Date  . Abscess drainage      History   Social History  . Marital Status: Single    Spouse Name: N/A  . Number of Children: N/A  . Years of Education: N/A   Occupational History  . Not on file.   Social History Main Topics  . Smoking status: Former Smoker    Quit date: 09/24/1994  . Smokeless tobacco: Never Used  . Alcohol Use: Yes     Comment: rarely  . Drug Use: No  . Sexual Activity: Yes    Birth Control/ Protection: None   Other Topics Concern  . Not on file   Social History Narrative    Allergies  Allergen Reactions  . Ampicillin Hives    No current outpatient prescriptions on file prior to visit.   No current facility-administered medications on file prior to visit.     Review of Systems  Constitutional: Negative for activity change, appetite change and fatigue.  HENT: Negative for congestion, sinus pressure and sore throat.   Eyes: Negative for visual disturbance.  Respiratory: Negative for cough, chest tightness, shortness of  breath and wheezing.   Cardiovascular: Negative for chest pain and palpitations.  Gastrointestinal: Negative for abdominal pain, constipation and abdominal distention.  Endocrine: Negative for polydipsia.  Genitourinary: Negative for dysuria and frequency.  Musculoskeletal: Negative for back pain and arthralgias.  Skin: Negative for rash.  Neurological: Negative for tremors, light-headedness and numbness.  Hematological: Does not bruise/bleed easily.  Psychiatric/Behavioral: Negative for behavioral problems and agitation.         Objective: Filed Vitals:   09/24/14 1204  BP: 122/81  Pulse: 88  Temp: 97.9 F (36.6 C)  Resp: 18      Physical Exam  Constitutional: She is oriented to person, place, and time. She appears well-developed and well-nourished. No distress.  Obese  HENT:  Head: Normocephalic.  Right Ear: External ear normal.  Left Ear: External ear normal.  Nose: Nose normal.  Mouth/Throat: Oropharynx is clear and moist.  Eyes: Conjunctivae and EOM are normal. Pupils are equal, round, and reactive to light.  Neck: Normal range of motion. No JVD present.  Cardiovascular: Normal rate, regular rhythm, normal heart sounds and intact distal pulses.  Exam reveals no gallop.   No murmur heard. Pulmonary/Chest: Effort normal and breath sounds normal. No respiratory distress. She has no wheezes. She has no rales. She exhibits no tenderness.  Abdominal: Soft. Bowel sounds  are normal. She exhibits no distension and no mass. There is no tenderness.  Musculoskeletal: Normal range of motion. She exhibits no edema or tenderness.  Neurological: She is alert and oriented to person, place, and time. She has normal reflexes.  Skin: Skin is warm and dry. She is not diaphoretic.  Psychiatric: She has a normal mood and affect.            Assessment & Plan:  49 year old female patient with a history hypertension and questionable history of hypothyroidism here to establish  care.  Hypertension: Controlled at this time. I am refilling her labetalol as this would be beneficial to the tachycardia which she had while at the [facility]. Fasting labs ordered as well. She will return to establish care with a PCP at her next office visit and also have her blood pressure and reassessed.  Questionable history of hypothyroidism: I am sending off a thyroid panel and will follow-up on results and medications if needed.  Obesity: Last modifications emphasized, reduce portion sizes, increase physical activity as weight loss could positively impact hypertension.

## 2014-09-24 NOTE — Patient Instructions (Signed)
Hypothyroidism The thyroid is a large gland located in the lower front of your neck. The thyroid gland helps control metabolism. Metabolism is how your body handles food. It controls metabolism with the hormone thyroxine. When this gland is underactive (hypothyroid), it produces too little hormone.  CAUSES These include:   Absence or destruction of thyroid tissue.  Goiter due to iodine deficiency.  Goiter due to medications.  Congenital defects (since birth).  Problems with the pituitary. This causes a lack of TSH (thyroid stimulating hormone). This hormone tells the thyroid to turn out more hormone. SYMPTOMS  Lethargy (feeling as though you have no energy)  Cold intolerance  Weight gain (in spite of normal food intake)  Dry skin  Coarse hair  Menstrual irregularity (if severe, may lead to infertility)  Slowing of thought processes Cardiac problems are also caused by insufficient amounts of thyroid hormone. Hypothyroidism in the newborn is cretinism, and is an extreme form. It is important that this form be treated adequately and immediately or it will lead rapidly to retarded physical and mental development. DIAGNOSIS  To prove hypothyroidism, your caregiver may do blood tests and ultrasound tests. Sometimes the signs are hidden. It may be necessary for your caregiver to watch this illness with blood tests either before or after diagnosis and treatment. TREATMENT  Low levels of thyroid hormone are increased by using synthetic thyroid hormone. This is a safe, effective treatment. It usually takes about four weeks to gain the full effects of the medication. After you have the full effect of the medication, it will generally take another four weeks for problems to leave. Your caregiver may start you on low doses. If you have had heart problems the dose may be gradually increased. It is generally not an emergency to get rapidly to normal. HOME CARE INSTRUCTIONS   Take your  medications as your caregiver suggests. Let your caregiver know of any medications you are taking or start taking. Your caregiver will help you with dosage schedules.  As your condition improves, your dosage needs may increase. It will be necessary to have continuing blood tests as suggested by your caregiver.  Report all suspected medication side effects to your caregiver. SEEK MEDICAL CARE IF: Seek medical care if you develop:  Sweating.  Tremulousness (tremors).  Anxiety.  Rapid weight loss.  Heat intolerance.  Emotional swings.  Diarrhea.  Weakness. SEEK IMMEDIATE MEDICAL CARE IF:  You develop chest pain, an irregular heart beat (palpitations), or a rapid heart beat. MAKE SURE YOU:   Understand these instructions.  Will watch your condition.  Will get help right away if you are not doing well or get worse. Document Released: 05/25/2005 Document Revised: 08/17/2011 Document Reviewed: 01/13/2008 ExitCare Patient Information 2015 [employer]. This information is not intended to replace advice given to you by your health care provider. Make sure you discuss any questions you have with your health care provider. Hypertension Hypertension, commonly called high blood pressure, is when the force of blood pumping through your arteries is too strong. Your arteries are the blood vessels that carry blood from your heart throughout your body. A blood pressure reading consists of a higher number over a lower number, such as 110/72. The higher number (systolic) is the pressure inside your arteries when your heart pumps. The lower number (diastolic) is the pressure inside your arteries when your heart relaxes. Ideally you want your blood pressure below 120/80. Hypertension forces your heart to work harder to pump blood. Your arteries may   become narrow or stiff. Having hypertension puts you at risk for heart disease, stroke, and other problems.  RISK FACTORS Some risk factors for high  blood pressure are controllable. Others are not.  Risk factors you cannot control include:   Race. You may be at higher risk if you are African American.  Age. Risk increases with age.  Gender. Men are at higher risk than women before age 45 years. After age 65, women are at higher risk than men. Risk factors you can control include:  Not getting enough exercise or physical activity.  Being overweight.  Getting too much fat, sugar, calories, or salt in your diet.  Drinking too much alcohol. SIGNS AND SYMPTOMS Hypertension does not usually cause signs or symptoms. Extremely high blood pressure (hypertensive crisis) may cause headache, anxiety, shortness of breath, and nosebleed. DIAGNOSIS  To check if you have hypertension, your health care provider will measure your blood pressure while you are seated, with your arm held at the level of your heart. It should be measured at least twice using the same arm. Certain conditions can cause a difference in blood pressure between your right and left arms. A blood pressure reading that is higher than normal on one occasion does not mean that you need treatment. If one blood pressure reading is high, ask your health care provider about having it checked again. TREATMENT  Treating high blood pressure includes making lifestyle changes and possibly taking medicine. Living a healthy lifestyle can help lower high blood pressure. You may need to change some of your habits. Lifestyle changes may include:  Following the DASH diet. This diet is high in fruits, vegetables, and whole grains. It is low in salt, red meat, and added sugars.  Getting at least 2 hours of brisk physical activity every week.  Losing weight if necessary.  Not smoking.  Limiting alcoholic beverages.  Learning ways to reduce stress. If lifestyle changes are not enough to get your blood pressure under control, your health care provider may prescribe medicine. You may need to  take more than one. Work closely with your health care provider to understand the risks and benefits. HOME CARE INSTRUCTIONS  Have your blood pressure rechecked as directed by your health care provider.   Take medicines only as directed by your health care provider. Follow the directions carefully. Blood pressure medicines must be taken as prescribed. The medicine does not work as well when you skip doses. Skipping doses also puts you at risk for problems.   Do not smoke.   Monitor your blood pressure at home as directed by your health care provider. SEEK MEDICAL CARE IF:   You think you are having a reaction to medicines taken.  You have recurrent headaches or feel dizzy.  You have swelling in your ankles.  You have trouble with your vision. SEEK IMMEDIATE MEDICAL CARE IF:  You develop a severe headache or confusion.  You have unusual weakness, numbness, or feel faint.  You have severe chest or abdominal pain.  You vomit repeatedly.  You have trouble breathing. MAKE SURE YOU:   Understand these instructions.  Will watch your condition.  Will get help right away if you are not doing well or get worse. Document Released: 05/25/2005 Document Revised: 10/09/2013 Document Reviewed: 03/17/2013 ExitCare Patient Information 2015 [employer]. This information is not intended to replace advice given to you by your health care provider. Make sure you discuss any questions you have with your health care provider.  

## 2014-09-25 ENCOUNTER — Ambulatory Visit: Payer: No Typology Code available for payment source | Attending: Family Medicine

## 2014-09-25 DIAGNOSIS — I10 Essential (primary) hypertension: Secondary | ICD-10-CM

## 2014-09-25 DIAGNOSIS — E030 Congenital hypothyroidism with diffuse goiter: Secondary | ICD-10-CM

## 2014-09-25 LAB — COMPREHENSIVE METABOLIC PANEL
ALK PHOS: 64 U/L (ref 39–117)
ALT: 19 U/L (ref 0–35)
AST: 16 U/L (ref 0–37)
Albumin: 4 g/dL (ref 3.5–5.2)
BILIRUBIN TOTAL: 0.4 mg/dL (ref 0.2–1.2)
BUN: 10 mg/dL (ref 6–23)
CO2: 28 mEq/L (ref 19–32)
CREATININE: 0.52 mg/dL (ref 0.50–1.10)
Calcium: 9 mg/dL (ref 8.4–10.5)
Chloride: 100 mEq/L (ref 96–112)
GLUCOSE: 102 mg/dL — AB (ref 70–99)
Potassium: 4.6 mEq/L (ref 3.5–5.3)
SODIUM: 136 meq/L (ref 135–145)
TOTAL PROTEIN: 6.6 g/dL (ref 6.0–8.3)

## 2014-09-25 LAB — TSH: TSH: 1.972 u[IU]/mL (ref 0.350–4.500)

## 2014-09-25 LAB — LIPID PANEL
CHOL/HDL RATIO: 4.8 ratio
Cholesterol: 206 mg/dL — ABNORMAL HIGH (ref 0–200)
HDL: 43 mg/dL — AB (ref >=46)
LDL CALC: 107 mg/dL — AB (ref 0–99)
Triglycerides: 280 mg/dL — ABNORMAL HIGH (ref <150)
VLDL: 56 mg/dL — AB (ref 0–40)

## 2014-09-25 LAB — T4, FREE: Free T4: 1.09 ng/dL (ref 0.80–1.80)

## 2014-09-25 LAB — T3, FREE: T3, Free: 3.5 pg/mL (ref 2.3–4.2)

## 2014-09-27 ENCOUNTER — Telehealth: Payer: Self-pay

## 2014-09-27 NOTE — Telephone Encounter (Signed)
Patient returned called from nurse, please f/u

## 2014-09-27 NOTE — Telephone Encounter (Signed)
Pt returning nurse's call, pt is worried because she was told during visit that she would only receive call with results if results were not normal.  Please f/u with pt.

## 2014-09-27 NOTE — Telephone Encounter (Signed)
-----   Message from Enobong Amao, MD sent at 09/26/2014 10:47 PM EDT ----- Thyroid panel is normal and she does not need to be on thyroid medications; lipids are mildly elevated, lifestyle changes and low cholesterol diet recommended. 

## 2014-09-27 NOTE — Telephone Encounter (Signed)
Nurse called patient, reached voicemail. Left message for patient to call Samik Balkcom at [phone].   

## 2014-09-28 ENCOUNTER — Other Ambulatory Visit: Payer: No Typology Code available for payment source

## 2014-09-28 NOTE — Telephone Encounter (Signed)
-----   Message from Jaclyn ShaggyEnobong Amao, MD sent at 09/26/2014 10:47 PM EDT ----- Thyroid panel is normal and she does not need to be on thyroid medications; lipids are mildly elevated, lifestyle changes and low cholesterol diet recommended.

## 2014-09-28 NOTE — Telephone Encounter (Signed)
Nurse called patient. Patient aware of normal thyroid panel and understands she does not need thyroid medications. Patient also aware of mildly elevated lipids and to exercise and eat a low cholesterol diet. Patient voices understanding.

## 2014-10-15 ENCOUNTER — Other Ambulatory Visit: Payer: Self-pay | Admitting: Family Medicine

## 2014-10-15 DIAGNOSIS — Z1231 Encounter for screening mammogram for malignant neoplasm of breast: Secondary | ICD-10-CM

## 2014-10-19 ENCOUNTER — Ambulatory Visit: Payer: No Typology Code available for payment source | Attending: Family Medicine

## 2014-11-19 ENCOUNTER — Ambulatory Visit (HOSPITAL_COMMUNITY)
Admission: RE | Admit: 2014-11-19 | Discharge: 2014-11-19 | Disposition: A | Discharge status: Home / Self Care | Payer: No Typology Code available for payment source | Source: Ambulatory Visit | Attending: Family Medicine | Admitting: Family Medicine

## 2014-11-19 DIAGNOSIS — Z1231 Encounter for screening mammogram for malignant neoplasm of breast: Secondary | ICD-10-CM

## 2014-11-23 ENCOUNTER — Ambulatory Visit (HOSPITAL_COMMUNITY): Payer: No Typology Code available for payment source

## 2015-01-04 ENCOUNTER — Encounter: Payer: Self-pay | Admitting: Internal Medicine

## 2015-01-04 ENCOUNTER — Ambulatory Visit: Payer: Self-pay | Attending: Internal Medicine | Admitting: Internal Medicine

## 2015-01-04 VITALS — BP 130/88 | HR 79 | Temp 98.5°F | Resp 18 | Ht 65.0 in | Wt 241.0 lb

## 2015-01-04 DIAGNOSIS — L02219 Cutaneous abscess of trunk, unspecified: Secondary | ICD-10-CM

## 2015-01-04 DIAGNOSIS — L03319 Cellulitis of trunk, unspecified: Secondary | ICD-10-CM

## 2015-01-04 DIAGNOSIS — M6530 Trigger finger, unspecified finger: Secondary | ICD-10-CM

## 2015-01-04 DIAGNOSIS — L918 Other hypertrophic disorders of the skin: Secondary | ICD-10-CM

## 2015-01-04 DIAGNOSIS — R252 Cramp and spasm: Secondary | ICD-10-CM

## 2015-01-04 DIAGNOSIS — Z87891 Personal history of nicotine dependence: Secondary | ICD-10-CM | POA: Insufficient documentation

## 2015-01-04 DIAGNOSIS — I10 Essential (primary) hypertension: Secondary | ICD-10-CM

## 2015-01-04 LAB — HEMOGLOBIN A1C
Hgb A1c MFr Bld: 6.3 % — ABNORMAL HIGH (ref <5.7)
MEAN PLASMA GLUCOSE: 134 mg/dL — AB (ref <117)

## 2015-01-04 MED ORDER — LORATADINE 10 MG PO TABS: 10.0000 mg | ORAL_TABLET | Freq: Every day | ORAL | Status: DC

## 2015-01-04 MED ORDER — DOXYCYCLINE HYCLATE 100 MG PO TABS: 100.0000 mg | ORAL_TABLET | Freq: Two times a day (BID) | ORAL | Status: DC

## 2015-01-04 MED ORDER — LABETALOL HCL 100 MG PO TABS: 100.0000 mg | ORAL_TABLET | Freq: Two times a day (BID) | ORAL | Status: DC

## 2015-01-04 NOTE — Patient Instructions (Addendum)
Your cholesterol was elevated on the last visit. I really want you to take this time to exercise and try to lose weight. This is the first step in lowering your cholesterol. Next I would like you to decrease portion or avoid foods such as fried foods, butters, oils, breads, pastas, potatoes, and rice. We will recheck your cholesterol in 6 months to see if you have had any improvement. If not you may require medication management.   I will call you next week with blood work results.   Trigger Finger Trigger finger (digital tendinitis and stenosing tenosynovitis) is a common disorder that causes an often painful catching of the fingers or thumb. It occurs as a clicking, snapping, or locking of a finger in the palm of the hand. This is caused by a problem with the tendons that flex or bend the fingers sliding smoothly through their sheaths. The condition may occur in any finger or a couple fingers at the same time.  The finger may lock with the finger curled or suddenly straighten out with a snap. This is more common in patients with rheumatoid arthritis and diabetes. Left untreated, the condition may get worse to the point where the finger becomes locked in flexion, like making a fist, or less commonly locked with the finger straightened out. CAUSES   Inflammation and scarring that lead to swelling around the tendon sheath.  Repeated or forceful movements.  Rheumatoid arthritis, an autoimmune disease that affects joints.  Gout.  Diabetes mellitus. SIGNS AND SYMPTOMS  Soreness and swelling of your finger.  A painful clicking or snapping as you bend and straighten your finger. DIAGNOSIS  Your health care provider will do a physical exam of your finger to diagnose trigger finger. TREATMENT   Splinting for 6-8 weeks may be helpful.  Nonsteroidal anti-inflammatory medicines (NSAIDs) can help to relieve the pain and inflammation.  Cortisone injections, along with splinting, may speed up  recovery. Several injections may be required. Cortisone may give relief after one injection.  Surgery is another treatment that may be used if conservative treatments do not work. Surgery can be minor, without incisions (a cut does not have to be made), and can be done with a needle through the skin.  Other surgical choices involve an open procedure in which the surgeon opens the hand through a small incision and cuts the pulley so the tendon can again slide smoothly. Your hand will still work fine. HOME CARE INSTRUCTIONS  Apply ice to the injured area, twice per day:  Put ice in a plastic bag.  Place a towel between your skin and the bag.  Leave the ice on for 20 minutes, 3-4 times a day.  Rest your hand often. MAKE SURE YOU:   Understand these instructions.  Will watch your condition.  Will get help right away if you are not doing well or get worse. Document Released: 03/14/2004 Document Revised: 01/25/2013 Document Reviewed: 10/25/2012 [facility] Patient Information 2015 [place], [place]. This information is not intended to replace advice given to you by your health care provider. Make sure you discuss any questions you have with your health care provider.

## 2015-01-04 NOTE — Progress Notes (Signed)
[facility], started taking medication 2 days ago, started itching, swelling in your fingers and feet. Patient received medication for "big pimple" on back. Stopped taking medication on Wednesday. Pimple has been on back since Thursday. Patient describes pain as burning at times and aching, currently at level 6.   Medication for pimple is sulfamethoxaz-tmp ds fp. Can not add to medication list.   Patient would like physical. Patient understands she may need to schedule appointment for physical.   Patient reports not being able to move fingers without pain sometimes.   Patient experiencing muscle cramps in legs.  Patient thinks blood pressure medication is too high of dose. Patient has only been taking 1 per day instead of twice per day.

## 2015-01-04 NOTE — Progress Notes (Signed)
Patient ID: Alexis Mooney, female   DOB: 02-19-1966, 49 y.o.   MRN: [id number]  CC: HTN f/u  HPI: Alexis Mooney is a 49 y.o. female here today for a follow up visit.  Patient has past medical history of HTN. She reports that she was given Labetalol 100 mg BID but has only been taking the medication once per day because she believes that twice per day to too strong for here. She has not been checking her pressures at home. She is currently up to date on pap and mammogram. She states [facility] "[facility]" on her upper back and was given Bactrim but believes the medication caused her to itch and swell so she stopped medication 2 days ago. "Pimple" has been present for one week and is described as burning and aching. Pain is rated as a 6/10.   She also complains of painful fingers and leg cramps since she began taking Labetalol. She notes that she has these symptoms when she takes 2 tablets of Labetalol. She reports that is another reason why she is taking one pill per day. She notes that she uses her hands all day at work because she works at [employer]. She is requesting to be tested for rheumatoid arthritis.   Patient has No headache, No chest pain, No abdominal pain - No Nausea, No new weakness tingling or numbness, No Cough - SOB.  Allergies  Allergen Reactions  . Ampicillin Hives   Past Medical History  Diagnosis Date  . Pollen allergies   . Abscess   . Hypertension   . Miscarriage    Current Outpatient Prescriptions on File Prior to Visit  Medication Sig Dispense Refill  . labetalol (NORMODYNE) 100 MG tablet Take 1 tablet (100 mg total) by mouth 2 (two) times daily. 60 tablet 2   No current facility-administered medications on file prior to visit.   Family History  Problem Relation Age of Onset  . Hypertension Mother   . Diabetes Mother   . Hypertension Father   . Diabetes Father   . Cancer Sister 53    stomach    History   Social History  . Marital Status: Single    Spouse Name: N/A  . Number of Children: N/A  . Years of Education: N/A   Occupational History  . Not on file.   Social History Main Topics  . Smoking status: Former Smoker    Quit date: 09/24/1994  . Smokeless tobacco: Never Used  . Alcohol Use: Yes     Comment: rarely  . Drug Use: No  . Sexual Activity: Yes    Birth Control/ Protection: None   Other Topics Concern  . Not on file   Social History Narrative    Review of Systems: See HPI   Objective:   Filed Vitals:   01/04/15 1010  BP: 130/88  Pulse: 79  Temp: 98.5 F (36.9 C)  Resp: 18    Physical Exam  Constitutional: She is oriented to person, place, and time.  Cardiovascular: Normal rate, regular rhythm and normal heart sounds.   Pulmonary/Chest: Effort normal and breath sounds normal.  Musculoskeletal: She exhibits no edema.  No swelling of hand joints   Neurological: She is alert and oriented to person, place, and time.  Skin:        Lab Results  Component Value Date   WBC 6.6 03/18/2010   HGB 13.6 03/18/2010   HCT 42.9 03/18/2010  MCV 91.9 03/18/2010   PLT 346 03/18/2010   Lab Results  Component Value Date   CREATININE 0.52 09/25/2014   BUN 10 09/25/2014   NA 136 09/25/2014   K 4.6 09/25/2014   CL 100 09/25/2014   CO2 28 09/25/2014    No results found for: HGBA1C Lipid Panel     Component Value Date/Time   CHOL 206* 09/25/2014 0912   TRIG 280* 09/25/2014 0912   HDL 43* 09/25/2014 0912   CHOLHDL 4.8 09/25/2014 0912   VLDL 56* 09/25/2014 0912   LDLCALC 107* 09/25/2014 0912       Assessment and plan:   Marcha was seen today for establish care.  Diagnoses and all orders for this visit:  Cellulitis and abscess of trunk Orders: -     Begin doxycycline (VIBRA-TABS) 100 MG tablet; Take 1 tablet (100 mg total) by mouth 2 (two) times daily. I will not give Kelflex due to possible allergy.   Essential  hypertension Orders: -     Refill labetalol (NORMODYNE) 100 MG tablet; Take 1 tablet (100 mg total) by mouth 2 (two) times daily. I would like patient to check evening BP's to see if they are elevated above 140/90. If her pressure remains normal all day she can continue on once per day dosing  Muscle cramps Orders: -     Basic Metabolic Panel Will check a potassium level   Trigger finger of right hand Orders: -     Uric Acid -     ANA -     Rheumatoid factor  Morbid obesity Orders: -     Hemoglobin A1c Weight loss discussed at length and its complications to health.  Patient will loss 10 months by next visit in 3 months.  Diet and exercise discussed as well as calorie intake.  Skin tag  Skin tag of left eye lid. She will call if she would like to have dermatology referral placed    Return in about 3 months (around 04/06/2015) for Hypertension.     Ambrose Finland, NP-C [facility] [phone] 01/04/2015, 10:38 AM

## 2015-01-05 LAB — BASIC METABOLIC PANEL
BUN: 9 mg/dL (ref 7–25)
CO2: 28 mmol/L (ref 20–31)
Calcium: 9.1 mg/dL (ref 8.6–10.2)
Chloride: 102 mmol/L (ref 98–110)
Creat: 0.46 mg/dL — ABNORMAL LOW (ref 0.50–1.10)
Glucose, Bld: 94 mg/dL (ref 65–99)
Potassium: 4.7 mmol/L (ref 3.5–5.3)
Sodium: 140 mmol/L (ref 135–146)

## 2015-01-05 LAB — URIC ACID: Uric Acid, Serum: 5.5 mg/dL (ref 2.4–7.0)

## 2015-01-05 LAB — RHEUMATOID FACTOR: Rhuematoid fact SerPl-aCnc: <10 IU/mL (ref <=14)

## 2015-01-07 LAB — ANA: ANA: NEGATIVE

## 2015-01-22 ENCOUNTER — Telehealth: Payer: Self-pay

## 2015-01-22 NOTE — Telephone Encounter (Signed)
Please call back and se what she is referring to so that I can help her.

## 2015-01-22 NOTE — Telephone Encounter (Signed)
-----   Message from Ambrose Finland, NP sent at 01/09/2015  6:07 PM EDT ----- Normal labs except he is borderline diabetic. Please educate on diet, exercise, and weight loss.

## 2015-01-22 NOTE — Telephone Encounter (Signed)
Nurse called patient, patient verified date of birth. Patient aware of normal labs except being borderline diabetic. Patient aware of making diet changes and avoiding pasta, rice, potatoes and agrees to add exercise, 30 minutes, three times a week, and weight loss to decrease blood glucose level.  Patient voices understanding and has no further questions at this time. Patient is questioning paperwork for immigration health screen. Provider told patient she would talk to doctor about this. Nurse will contact provider.

## 2015-01-31 ENCOUNTER — Telehealth: Payer: Self-pay | Admitting: Internal Medicine

## 2015-01-31 NOTE — Telephone Encounter (Signed)
Patient is requesting a refill for labetalol (NORMODYNE) 100 MG tablet. She came in July but forgot to mention to the pcp that she needed a refill.

## 2015-02-04 ENCOUNTER — Telehealth: Payer: Self-pay

## 2015-02-04 DIAGNOSIS — I10 Essential (primary) hypertension: Secondary | ICD-10-CM

## 2015-02-04 MED ORDER — LABETALOL HCL 100 MG PO TABS: 100.0000 mg | ORAL_TABLET | Freq: Two times a day (BID) | ORAL | Status: DC

## 2015-02-04 NOTE — Telephone Encounter (Signed)
Patient called requesting a refill on her lab[facility]olol [facility]

## 2015-02-04 NOTE — Telephone Encounter (Signed)
Patient is requesting a refill for labetalol (NORMODYNE) 100 MG tablet. She came in July but forgot to mention to the pcp that she needed a refill.  °

## 2015-02-14 ENCOUNTER — Telehealth: Payer: Self-pay | Admitting: Internal Medicine

## 2015-02-14 NOTE — Telephone Encounter (Signed)
Patient called on regards of medication. Patient wants to know if the provider can prescribe a medication for the arthritis in her hands. Please f/u with patient.

## 2015-03-21 NOTE — Telephone Encounter (Signed)
Nurse called patient, patient verified date of birth. Patient has completed immigration documents and does not need anything at this time.

## 2015-03-21 NOTE — Telephone Encounter (Signed)
Nurse called patient, reached voicemail. Left message for patient to call Eithen Castiglia with [facility], at [phone].  

## 2015-05-13 ENCOUNTER — Telehealth: Payer: Self-pay

## 2015-05-13 NOTE — Telephone Encounter (Signed)
Returned patient phone call Patient not available Message left on voice mail to return our call 

## 2015-06-24 MED FILL — LABETALOL HCL 100 MG TABLET: 100 | 30 days supply | Qty: 60 | Fill #3

## 2015-09-23 ENCOUNTER — Telehealth (HOSPITAL_COMMUNITY): Payer: Self-pay | Admitting: *Deleted

## 2015-09-23 NOTE — Telephone Encounter (Signed)
Telephoned patient at home # and left message to return call to [facility]. Used interpreter Julie Sowell. 

## 2015-09-30 ENCOUNTER — Telehealth (HOSPITAL_COMMUNITY): Payer: Self-pay | Admitting: *Deleted

## 2015-09-30 NOTE — Telephone Encounter (Signed)
Telephoned patient at home # and left message to return call to [facility]. Used interpreter Julie Sowell. 

## 2015-10-07 ENCOUNTER — Other Ambulatory Visit (HOSPITAL_COMMUNITY): Payer: Self-pay | Admitting: *Deleted

## 2015-10-07 ENCOUNTER — Encounter (HOSPITAL_COMMUNITY): Payer: Self-pay | Admitting: *Deleted

## 2015-10-07 DIAGNOSIS — N644 Mastodynia: Secondary | ICD-10-CM

## 2015-10-24 ENCOUNTER — Ambulatory Visit
Admission: RE | Admit: 2015-10-24 | Discharge: 2015-10-24 | Disposition: A | Discharge status: Home / Self Care | Payer: No Typology Code available for payment source | Source: Ambulatory Visit | Attending: Obstetrics and Gynecology | Admitting: Obstetrics and Gynecology

## 2015-10-24 ENCOUNTER — Encounter (HOSPITAL_COMMUNITY): Payer: Self-pay

## 2015-10-24 ENCOUNTER — Ambulatory Visit (HOSPITAL_COMMUNITY)
Admission: RE | Admit: 2015-10-24 | Discharge: 2015-10-24 | Disposition: A | Discharge status: Home / Self Care | Payer: Self-pay | Source: Ambulatory Visit | Attending: Obstetrics and Gynecology | Admitting: Obstetrics and Gynecology

## 2015-10-24 VITALS — BP 112/70 | Temp 98.0°F | Ht 61.02 in | Wt 239.2 lb

## 2015-10-24 DIAGNOSIS — Z1239 Encounter for other screening for malignant neoplasm of breast: Secondary | ICD-10-CM

## 2015-10-24 DIAGNOSIS — N644 Mastodynia: Secondary | ICD-10-CM

## 2015-10-24 NOTE — Patient Instructions (Signed)
Educational materials on self breast awareness given. Explained to Alexis Mooney that she did not need a Pap smear today due to last Pap smear was 10/01/2014. Let her know [facility] will cover Pap smears every 3 years unless has a history of abnormal Pap smears. [facility]. Appointment scheduled for Thursday, Oct 24, 2015 at 0840. Alexis Mooney verbalized understanding.  Adine Heimann, Kathaleen Maserhristine Poll, RN 8:39 AM

## 2015-10-24 NOTE — Progress Notes (Signed)
Complaints of left lower breast pain x 1 month that comes and goes. Patient rates pain at a 4-5 out of 10.  Pap Smear:  Pap smear not completed today. [facility] and normal. Per patient has no history of an abnormal Pap smear. Last Pap smear result is in EPIC.  Physical exam: Breasts Breasts symmetrical. No skin abnormalities bilateral breasts. No nipple retraction bilateral breasts. No nipple discharge bilateral breasts. No lymphadenopathy. No lumps palpated bilateral breasts. Complaints of left outer and lower breast tenderness on exam. Complaints of right lower outer and center breast tenderness on exam. [facility]. Appointment scheduled for Thursday, Oct 24, 2015 at 0840.     Pelvic/Bimanual No Pap smear completed today since last Pap smear was 10/01/2014. Pap smear not indicated per [facility] guidelines.   Smoking History: Patient has never smoked.  Patient Navigation: Patient education provided. [facility].

## 2015-11-08 ENCOUNTER — Encounter (HOSPITAL_COMMUNITY): Payer: Self-pay | Admitting: *Deleted

## 2015-11-14 ENCOUNTER — Telehealth: Payer: Self-pay

## 2015-11-14 NOTE — Telephone Encounter (Signed)
Has been called twice by interpreter Delorise RoyalsJulie Sowell with no return call from patient.

## 2015-12-04 ENCOUNTER — Telehealth: Payer: Self-pay

## 2015-12-04 NOTE — Telephone Encounter (Signed)
Called and reached voice mail. Left message  [facility] and that need to be fasting. Address left on message.

## 2015-12-04 NOTE — Telephone Encounter (Signed)
Called to leave more information on voice mail about appointment

## 2015-12-05 ENCOUNTER — Ambulatory Visit: Payer: No Typology Code available for payment source

## 2015-12-05 ENCOUNTER — Other Ambulatory Visit: Payer: No Typology Code available for payment source

## 2015-12-05 ENCOUNTER — Telehealth: Payer: Self-pay

## 2015-12-05 NOTE — Telephone Encounter (Signed)
Called to see about missing appointment. Left message to return call.

## 2015-12-05 NOTE — Telephone Encounter (Signed)
Called per Alexis SenegalMarly Mooney to see what happened with patient this morning. Patient stated that called and left message yesterday that could not come for appointment because did not have it clear with work. Stated that would call back and make an appointment when had her schedule approved for missing work.

## 2015-12-09 ENCOUNTER — Telehealth: Payer: Self-pay

## 2015-12-09 NOTE — Telephone Encounter (Signed)
Patient called and wanted to reschedule her appointment. Stated would like Wednesday or Thursday of this week. Patient scheduled for Wednesday, July 5 at 8 AM. Directions given of location and fasting. Patient asked if could take medications. Informed the patient that it was all right.

## 2015-12-11 ENCOUNTER — Ambulatory Visit (HOSPITAL_BASED_OUTPATIENT_CLINIC_OR_DEPARTMENT_OTHER): Payer: Self-pay

## 2015-12-11 ENCOUNTER — Other Ambulatory Visit: Payer: Self-pay

## 2015-12-11 VITALS — BP 146/94 | HR 90 | Temp 98.6°F | Resp 14 | Ht 63.0 in | Wt 238.2 lb

## 2015-12-11 DIAGNOSIS — Z0000 Encounter for general adult medical examination without abnormal findings: Secondary | ICD-10-CM

## 2015-12-11 LAB — HEMOGLOBIN A1C
ESTIMATED AVERAGE GLUCOSE: 128 mg/dL
HEMOGLOBIN A1C: 6.1 % — AB (ref 4.8–5.6)

## 2015-12-11 NOTE — Progress Notes (Signed)
[facility] a [facility] and is currently a [facility] patient effective 10/24/2015.   Clinical Measurements: Patient is 5 ft. 3 inches, weight 238.2 lbs, and BMI 42.3.   Medical History: Patient has no history of high cholesterol or diabetes . States has been in pre diabetes range. Patient does have a history of hypertension and is taking labetalol. Per patient no diagnosed history of coronary heart disease, heart attack, heart failure, stroke/TIA, vascular disease or congenital heart defects.   Blood Pressure, Self-measurement: Patient states does not have equipment to check Blood pressure. Informed patient about HEART WISE Program.  Nutrition Assessment: Patient stated that eats fruit two times a week. Patient states she eats 2 servings of vegetables a day. Per patient does not eat 3 or more ounces of whole grains daily. Patient doesn't eat two or more servings of fish weekly. Patient states she does drink more than 36 ounces or 450 calories of beverages with added sugars weekly. Patient states she drinks juice and a rare soda.. Patient stated she does not  watch her salt intake.   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 Patient stated has probably been doing 600 minutes of moderate exercise a week and no vigorous exercise.  Smoking Status: Patient stated that quit smoking over 20 years ago and is not exposed to smoke.   Quality of Life Assessment: In assessing patient's physical quality of life she stated that out of the past 30 days that she has felt her health was good all days. Patient also stated that in the past 30 days that her mental health was not good including stress, depression and problems with emotions for 8 days. Patient did state that out of the past 30 days she felt her physical or mental health had  kept not her from doing her usual activities including self-care, work or recreation for all days.    Plan: Lab work will be done today including a lipid panel and Hgb A1C. Will call lab results when they are finished. Patient will receive [facility] [employer] Coaching for risk reduction initially and then as patient wants.

## 2015-12-11 NOTE — Patient Instructions (Signed)
Discussed health assessment with patient. Patient stated when goes to grocery store was going to make better food choice She will be called with results of lab work and we will then discussed any further follow up the patient needs. Patient verbalized understanding.

## 2015-12-12 ENCOUNTER — Telehealth: Payer: Self-pay

## 2015-12-12 LAB — LIPID PANEL
CHOL/HDL RATIO: 4.1 ratio (ref 0.0–4.4)
Cholesterol, Total: 198 mg/dL (ref 100–199)
HDL: 48 mg/dL (ref >39)
LDL Calculated: 118 mg/dL — ABNORMAL HIGH (ref 0–99)
Triglycerides: 162 mg/dL — ABNORMAL HIGH (ref 0–149)
VLDL CHOLESTEROL CAL: 32 mg/dL (ref 5–40)

## 2015-12-12 NOTE — Progress Notes (Signed)
FIRST HEALTH COACH: During initial screening on December 11, 2015, wellness health coaching was started.  Nutrition: Discussed with patient that normally should eat 2 servings of fruits and 2 to 21/2 of vegetables a day with a serving being 1/2 cup or small piece. Informed patient that two servings of fish were recommended per week, being ones with omega threes. Listed the fishes with omega three's for patient. Discussed that should have 5 to 7 ounces of grains a day. Explained to patient that should eat fruits and vegetables high in fiber but 3 of her 5 to 7 grains of fiber should be whole grain. Examples of whole grains were given. Informed about limiting or watching sugar intake. Need to read labels as well as not add refined sugars to drinks. Discussed decreasing salt intake by mainly adding some while cook and none at table.  Physical Activity:  Patient was Informed that minimal moderate activity is 150 minutes a week or 75 minutes of vigorous. Patient is doing her minimal moderate exercise. Discussed adding in some flexeibily and balance exercises.  PLAN: Will set goal when call lab results and do second health coaching. Will think about what goals may want to set.

## 2015-12-12 NOTE — Telephone Encounter (Addendum)
LAB RESULTS: Patient called to be informed about lab work from 12/11/15. I informed patient: cholesterol- 198, HDL- 48, LDL- 118, triglycerides - 425162,  HBG-A1C - 6.1, and BMI 42.3.  DOCTOR's APPOINTMENT: Told patient I am required to send her to doctor for her blood pressure even though on medicine. I told her it would be the same place that she went before and I would ask about bills.I had been trying to call for appointment but not been able to get it yet. Informed patient I will call her the appointment.  Blood Pressure: Blood pressure readings were 146/94 and 132/102. Discussed non changeable and changeable risk factors with patient. Discussed changeable risk factors specific to patient. These included overweight, salt intake and stress. Informed about the Heart Wise Program.  HEALTH COACHING: Did risk reduction counseling/Heach Coach concerning related to BMI/Weight, Triglycerides, and A1C. Patient and I discussed her eating and exercise. Discussed serving sizes and reminded her of her WISEWOMAN overview and normal serving servings and portions suppose to eat.  WISEWOMAN sheet has all food group minimal servings required for day for1600 calorie diet. Discussed if wanted could do Heart Wise program for hypertension. Discussed exercise. Discussed needed to decrease number of starches eat to 6 servings a day and to decrease amount of sugar or sugary products that take in.  NAVIGATION NEEDS ASSESSMENT AND CARE PLAN: Patient does not need additional social support or lack access to services.Patient understands why she has to be followed up for blood pressure. Only barrier may have been that patient is not aware of some services to assist the low income. Patient has recently started working and work schedule does interfere with ability for appointments.  PLAN: Patient will attend doctor's appointment. Will call patient back to give appointment and after appointment to see how went. Patient and I will discuss  more health coaching concerning patients exercise and weight loss goals

## 2015-12-13 ENCOUNTER — Telehealth: Payer: Self-pay

## 2015-12-13 NOTE — Telephone Encounter (Signed)
Interpreter Albertina SenegalMarly Adams called to inform patient of scheduled doctors appointment. Voice mail was reached and interpreter left message of when appointment was and that needed to call back for more information.

## 2015-12-18 ENCOUNTER — Telehealth: Payer: Self-pay

## 2015-12-18 NOTE — Telephone Encounter (Addendum)
Called per Albertina SenegalMarly Adams interpreter. Patient kept saying that would not be able to make appointment. Interpreter informed patient that could not wait until the same day to change appointment. Explained that needs to come by and pick up the office packet of information to take to office. Interpreter stated that patient seemed hurried.

## 2015-12-23 ENCOUNTER — Ambulatory Visit
Payer: No Typology Code available for payment source | Admitting: Student in an Organized Health Care Education/Training Program

## 2015-12-24 ENCOUNTER — Telehealth: Payer: Self-pay

## 2015-12-24 NOTE — Telephone Encounter (Signed)
Called to see why did not go to doctor's appointment and for appointment for health coaching. Interpreter Albertina SenegalMarly Adams reached voicemail and asked patient to return the call.

## 2016-01-23 ENCOUNTER — Telehealth: Payer: Self-pay

## 2016-01-23 NOTE — Telephone Encounter (Addendum)
Called per Delorise RoyalsJulie Sowell interpreter for [facility]. Reached voice mail and left message to return call.

## 2016-07-20 ENCOUNTER — Telehealth: Payer: Self-pay

## 2016-07-20 NOTE — Telephone Encounter (Signed)
Left Message regarding Wisewoman F/U with Interpretor Nira ConnJulia Sowell.

## 2016-08-10 NOTE — Telephone Encounter (Signed)
Alexis FanningJulie interpretor left message for [facility] #[facility]

## 2016-08-28 ENCOUNTER — Other Ambulatory Visit: Payer: Self-pay | Admitting: Obstetrics and Gynecology

## 2016-09-21 ENCOUNTER — Ambulatory Visit: Payer: No Typology Code available for payment source | Attending: Internal Medicine

## 2016-09-21 ENCOUNTER — Ambulatory Visit: Payer: No Typology Code available for payment source | Admitting: Family Medicine

## 2016-10-06 ENCOUNTER — Other Ambulatory Visit: Payer: Self-pay | Admitting: Obstetrics and Gynecology

## 2016-10-06 DIAGNOSIS — Z1231 Encounter for screening mammogram for malignant neoplasm of breast: Secondary | ICD-10-CM

## 2016-10-19 ENCOUNTER — Ambulatory Visit: Payer: Self-pay | Attending: Internal Medicine | Admitting: Internal Medicine

## 2016-10-19 ENCOUNTER — Encounter: Payer: Self-pay | Admitting: Internal Medicine

## 2016-10-19 VITALS — BP 138/90 | HR 105 | Temp 98.1°F | Ht 63.0 in | Wt 243.4 lb

## 2016-10-19 DIAGNOSIS — M25561 Pain in right knee: Secondary | ICD-10-CM | POA: Insufficient documentation

## 2016-10-19 DIAGNOSIS — I10 Essential (primary) hypertension: Secondary | ICD-10-CM | POA: Insufficient documentation

## 2016-10-19 DIAGNOSIS — G8929 Other chronic pain: Secondary | ICD-10-CM | POA: Insufficient documentation

## 2016-10-19 DIAGNOSIS — Z882 Allergy status to sulfonamides status: Secondary | ICD-10-CM | POA: Insufficient documentation

## 2016-10-19 DIAGNOSIS — M25562 Pain in left knee: Secondary | ICD-10-CM | POA: Insufficient documentation

## 2016-10-19 DIAGNOSIS — Z880 Allergy status to penicillin: Secondary | ICD-10-CM | POA: Insufficient documentation

## 2016-10-19 DIAGNOSIS — Z6841 Body Mass Index (BMI) 40.0 and over, adult: Secondary | ICD-10-CM | POA: Insufficient documentation

## 2016-10-19 MED ORDER — MELOXICAM 15 MG PO TABS: 15.0000 mg | ORAL_TABLET | Freq: Every day | ORAL | 1 refills | Status: DC

## 2016-10-19 MED ORDER — LABETALOL HCL 100 MG PO TABS: 50.0000 mg | ORAL_TABLET | Freq: Two times a day (BID) | ORAL | 2 refills | Status: DC

## 2016-10-19 MED FILL — MELOXICAM 15 MG TABLET: 15 | 30 days supply | Qty: 30 | Fill #0

## 2016-10-19 MED FILL — LABETALOL HCL 100 MG TABLET: 100 | 30 days supply | Qty: 30 | Fill #0
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
## 2016-10-19 NOTE — Patient Instructions (Addendum)
Follow up in 1 mth with Alfonso EllisStacy Hammer for BP check.  Check your blood pressure 2 times a week and bring in readings in 1 month.   Follow a Healthy Eating Plan - You can do it! Limit sugary drinks.  Avoid sodas, sweet tea, sport or energy drinks, or fruit drinks.  Drink water, lo-fat milk, or diet drinks. Limit snack foods.   Cut back on candy, cake, cookies, chips, ice cream.  These are a special treat, only in small amounts. Eat plenty of vegetables.  Especially dark green, red, and orange vegetables. Aim for at least 3 servings a day. More is better! Include fruit in your daily diet.  Whole fruit is much healthier than fruit juice! Limit "white" bread, "white" pasta, "white" rice.   Choose "100% whole grain" products, brown or wild rice. Avoid fatty meats. Try "Meatless Monday" and choose eggs or beans one day a week.  When eating meat, choose lean meats like chicken, Malaw[place]urkey, and fish.  Grill, broil, or bake meats instead of frying, and eat poultry without the skin. Eat less salt.  Avoid frozen pizzas, frozen dinners and salty foods.  Use seasonings other than salt in cooking.  This can help blood pressure and keep you from swelling Beer, wine and liquor have calories.  If you can safely drink alcohol, limit to 1 drink per day for women, 2 drinks for men   Try to walk for about 10 minutes three days a week then gradually increase to 20 minutes.

## 2016-10-20 LAB — COMPREHENSIVE METABOLIC PANEL
A/G RATIO: 1.7 (ref 1.2–2.2)
ALK PHOS: 78 IU/L (ref 39–117)
ALT: 33 IU/L — ABNORMAL HIGH (ref 0–32)
AST: 26 IU/L (ref 0–40)
Albumin: 4.5 g/dL (ref 3.5–5.5)
BILIRUBIN TOTAL: 0.3 mg/dL (ref 0.0–1.2)
BUN/Creatinine Ratio: 21 (ref 9–23)
BUN: 11 mg/dL (ref 6–24)
CHLORIDE: 102 mmol/L (ref 96–106)
CO2: 23 mmol/L (ref 18–29)
Calcium: 10.3 mg/dL — ABNORMAL HIGH (ref 8.7–10.2)
Creatinine, Ser: 0.52 mg/dL — ABNORMAL LOW (ref 0.57–1.00)
GFR calc Af Amer: 129 mL/min/{1.73_m2} (ref >59)
GFR calc non Af Amer: 112 mL/min/{1.73_m2} (ref >59)
GLOBULIN, TOTAL: 2.7 g/dL (ref 1.5–4.5)
Glucose: 101 mg/dL — ABNORMAL HIGH (ref 65–99)
POTASSIUM: 4.3 mmol/L (ref 3.5–5.2)
SODIUM: 143 mmol/L (ref 134–144)
Total Protein: 7.2 g/dL (ref 6.0–8.5)

## 2016-10-20 LAB — CBC
HEMATOCRIT: 43.8 % (ref 34.0–46.6)
Hemoglobin: 14.5 g/dL (ref 11.1–15.9)
MCH: 29.5 pg (ref 26.6–33.0)
MCHC: 33.1 g/dL (ref 31.5–35.7)
MCV: 89 fL (ref 79–97)
PLATELETS: 322 10*3/uL (ref 150–379)
RBC: 4.92 x10E6/uL (ref 3.77–5.28)
RDW: 13.9 % (ref 12.3–15.4)
WBC: 7 10*3/uL (ref 3.4–10.8)

## 2016-10-20 LAB — LIPID PANEL
CHOLESTEROL TOTAL: 232 mg/dL — AB (ref 100–199)
Chol/HDL Ratio: 5.4 ratio — ABNORMAL HIGH (ref 0.0–4.4)
HDL: 43 mg/dL (ref >39)
TRIGLYCERIDES: 451 mg/dL — AB (ref 0–149)

## 2016-10-20 LAB — TSH: TSH: 1.5 u[IU]/mL (ref 0.450–4.500)

## 2016-10-21 ENCOUNTER — Encounter: Payer: Self-pay | Admitting: Gynecology

## 2016-10-26 ENCOUNTER — Telehealth: Payer: Self-pay | Admitting: *Deleted

## 2016-10-26 NOTE — Telephone Encounter (Signed)
-----   Message from Marcine Matareborah B Johnson, MD sent at 10/20/2016  8:19 AM EDT ----- Let pt know that her thyroid level, kidney and liver function tests are  Normal.  Bblood count is normal (meaning no anemia).  Cholesterol level is elevated compared to last year but I would like to repeat on a future visit with her fasting.  On next visit, do not eat anything for 4-5 hours before coming to have repeat cholesterol check.

## 2016-10-26 NOTE — Telephone Encounter (Signed)
Medical Assistant used [employer] to contact patient.  Interpreter Name: Carlos Interpreter #: [id number] Patient was not available, [employer] left patient a voicemail. Voicemail states to give a call back to Nubia with [facility] at [phone].   

## 2016-10-27 ENCOUNTER — Ambulatory Visit
Admission: RE | Admit: 2016-10-27 | Discharge: 2016-10-27 | Disposition: A | Discharge status: Home / Self Care | Payer: No Typology Code available for payment source | Source: Ambulatory Visit | Attending: Obstetrics and Gynecology | Admitting: Obstetrics and Gynecology

## 2016-10-27 ENCOUNTER — Ambulatory Visit (HOSPITAL_COMMUNITY)
Admission: RE | Admit: 2016-10-27 | Discharge: 2016-10-27 | Disposition: A | Discharge status: Home / Self Care | Payer: Self-pay | Source: Ambulatory Visit | Attending: Obstetrics and Gynecology | Admitting: Obstetrics and Gynecology

## 2016-10-27 ENCOUNTER — Encounter (HOSPITAL_COMMUNITY): Payer: Self-pay | Admitting: *Deleted

## 2016-10-27 VITALS — BP 112/76 | Temp 98.2°F | Ht 61.02 in | Wt 244.8 lb

## 2016-10-27 DIAGNOSIS — Z1239 Encounter for other screening for malignant neoplasm of breast: Secondary | ICD-10-CM

## 2016-10-27 DIAGNOSIS — Z1231 Encounter for screening mammogram for malignant neoplasm of breast: Secondary | ICD-10-CM

## 2016-10-27 NOTE — Patient Instructions (Signed)
Explained breast self awareness with Alexis Mooney. Patient did not need a Pap smear today due to last Pap smear was in March 2018 per patient. Let her know [facility] will cover Pap smears every 3 years unless has a history of abnormal Pap smears. [facility]. Appointment scheduled for Tuesday, Oct 27, 2016 at 0910. Let patient know the Breast Center will follow up with her within the next couple weeks with results of mammogram by letter or phone. Alexis Mooney verbalized understanding.  Kenedee Molesky, Kathaleen Maserhristine Poll, RN 8:40 AM

## 2016-10-27 NOTE — Progress Notes (Signed)
No complaints today.   Pap Smear: Pap smear not completed today. [facility] and normal per patient. Per patient has no history of an abnormal Pap smear. Last Pap smear result is not in EPIC but previous result from 10/01/2014 is in EPIC.  Physical exam: Breasts Breasts symmetrical. No skin abnormalities bilateral breasts. No nipple retraction bilateral breasts. No nipple discharge bilateral breasts. No lymphadenopathy. No lumps palpated bilateral breasts. No complaints of pain or tenderness on exam. [facility] mamm[facility]. Appointment scheduled for Tuesday, Oct 27, 2016 at 0910.        Pelvic/Bimanual No Pap smear completed today since last Pap smear was in March 2018 per patient. Pap smear not indicated per BCCCP guidelines.   Smoking History: Patient has never smoked.  Patient Navigation: Patient education provided. Access to services provided for patient through [facility] program.   Colorectal Cancer Screening: Per patient has never had a colonoscopy completed. No complaints today. FIT Test given to patient to complete and return to [facility].

## 2016-10-28 ENCOUNTER — Telehealth: Payer: Self-pay | Admitting: Internal Medicine

## 2016-10-28 NOTE — Telephone Encounter (Signed)
Patient returned nurse' call regarding lab results.   Thank you.

## 2016-10-29 NOTE — Telephone Encounter (Signed)
Patient returned nurse call regarding results. Please f/u  °

## 2016-10-30 ENCOUNTER — Other Ambulatory Visit: Payer: Self-pay

## 2016-10-30 NOTE — Telephone Encounter (Signed)
Patient verified DOB Patient is aware of thyroid, kidney, liver function being normal Patient is also aware of blood count showing no anemia. Patient is aware of cholesterol medication being elevated from last year. Patient advised to limit saturated fat intake and to have a fasting repeat completed at the next visit. Patient expressed her understanding and had no further questions.

## 2016-11-03 ENCOUNTER — Encounter (HOSPITAL_COMMUNITY): Payer: Self-pay | Admitting: *Deleted

## 2016-11-06 LAB — FECAL OCCULT BLOOD, IMMUNOCHEMICAL: FECAL OCCULT BLD: NEGATIVE

## 2016-11-25 ENCOUNTER — Encounter (HOSPITAL_COMMUNITY): Payer: Self-pay | Admitting: *Deleted

## 2016-11-25 NOTE — Progress Notes (Signed)
Letter mailed to patient with negative Fit Test results.  

## 2016-12-21 MED FILL — ?MELOXICAM 15MG TABLET: 15 | 30 days supply | Qty: 30 | Fill #1

## 2016-12-21 MED FILL — LABETALOL HCL 100 MG TABLET: 100 | 30 days supply | Qty: 30 | Fill #1

## 2016-12-23 ENCOUNTER — Telehealth: Payer: Self-pay

## 2016-12-23 NOTE — Telephone Encounter (Signed)
error 

## 2016-12-28 ENCOUNTER — Encounter: Payer: Self-pay | Admitting: Internal Medicine

## 2016-12-28 ENCOUNTER — Ambulatory Visit: Payer: Self-pay | Attending: Internal Medicine | Admitting: Internal Medicine

## 2016-12-28 VITALS — BP 130/90 | HR 111 | Temp 98.1°F | Resp 16 | Wt 240.2 lb

## 2016-12-28 DIAGNOSIS — G8929 Other chronic pain: Secondary | ICD-10-CM | POA: Insufficient documentation

## 2016-12-28 DIAGNOSIS — E781 Pure hyperglyceridemia: Secondary | ICD-10-CM | POA: Insufficient documentation

## 2016-12-28 DIAGNOSIS — Z6841 Body Mass Index (BMI) 40.0 and over, adult: Secondary | ICD-10-CM | POA: Insufficient documentation

## 2016-12-28 DIAGNOSIS — M25562 Pain in left knee: Secondary | ICD-10-CM | POA: Insufficient documentation

## 2016-12-28 DIAGNOSIS — Z87891 Personal history of nicotine dependence: Secondary | ICD-10-CM | POA: Insufficient documentation

## 2016-12-28 DIAGNOSIS — I10 Essential (primary) hypertension: Secondary | ICD-10-CM | POA: Insufficient documentation

## 2016-12-28 DIAGNOSIS — Z5181 Encounter for therapeutic drug level monitoring: Secondary | ICD-10-CM | POA: Insufficient documentation

## 2016-12-28 DIAGNOSIS — R21 Rash and other nonspecific skin eruption: Secondary | ICD-10-CM | POA: Insufficient documentation

## 2016-12-28 DIAGNOSIS — Z880 Allergy status to penicillin: Secondary | ICD-10-CM | POA: Insufficient documentation

## 2016-12-28 DIAGNOSIS — Z882 Allergy status to sulfonamides status: Secondary | ICD-10-CM | POA: Insufficient documentation

## 2016-12-28 DIAGNOSIS — Z79899 Other long term (current) drug therapy: Secondary | ICD-10-CM | POA: Insufficient documentation

## 2016-12-28 DIAGNOSIS — M25561 Pain in right knee: Secondary | ICD-10-CM | POA: Insufficient documentation

## 2016-12-28 MED ORDER — LABETALOL HCL 100 MG PO TABS: 50.0000 mg | ORAL_TABLET | Freq: Two times a day (BID) | ORAL | 2 refills | Status: DC

## 2016-12-28 MED ORDER — TRAMADOL HCL 50 MG PO TABS: 50.0000 mg | ORAL_TABLET | Freq: Three times a day (TID) | ORAL | 2 refills | Status: DC | PRN

## 2016-12-28 MED ORDER — MELOXICAM 15 MG PO TABS: 15.0000 mg | ORAL_TABLET | Freq: Every day | ORAL | 1 refills | Status: DC

## 2016-12-28 MED ORDER — HYDROCORTISONE 2.5 % EX CREA: TOPICAL_CREAM | Freq: Every day | CUTANEOUS | 0 refills | Status: DC

## 2016-12-28 MED FILL — traMADol HCL 50 MG TABS: 50 | 13 days supply | Qty: 40 | Fill #0

## 2016-12-28 MED FILL — HYDROCORTISONE 2.5% CREAM: 2.5 | 15 days supply | Qty: 30 | Fill #0

## 2016-12-28 NOTE — Progress Notes (Signed)
Patient ID: Alexis Mooney, female    DOB: 10/02/1965  MRN: [id number]  CC: Knee Pain; Referral (dermatology); and Rash   Subjective: Alexis Mooney is a 51 y.o. female who presents for two-month follow-up visit Her concerns today include:  History of HTN, obesity, probable osteoarthritis of the knees, hyperlipidemia 1.  HTN: -out of Labetalol x 2 days. Just pick up med and took while in waiting area -avoids salty foods -No chest pains, shortness of breath, lower extremity edema.  2.  Obesity: She avoids buying foods like ice cream that she knows she has a weakness for  3. Rash around mouth x 1 mth. Was itching and burning initially for 1-2 days.  She had 2 events that she thinks may have caused or contributed. One was spraying perfume around the facial area. The other was accidentally wrapping her chin when she had bleach on her hand No make up.  3. Knees still bothersome LT knee "hard pain like a knife." RT knee pain not as strong + swelling in knees, "very hard pain with standing."  Hurts to lift legs to get into shower.  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   Patient Active Problem List   Diagnosis Date Noted  . Morbid obesity with BMI of 40.0-44.9, adult (HCC) 10/19/2016  . Perimenopause 01/22/2014  . Essential hypertension, benign 01/22/2014     Current Outpatient Prescriptions on File Prior to Visit  Medication Sig Dispense Refill  . loratadine (CLARITIN) 10 MG tablet Take 1 tablet (10 mg total) by mouth daily. 30 tablet 11   No current facility-administered medications on file prior to visit.     Allergies  Allergen Reactions  . Ampicillin Hives  . Bactrim [Sulfamethoxazole-Trimethoprim] Swelling    Social History   Social History  . Marital status: Single    Spouse name: N/A  . Number of children: N/A  .  Years of education: N/A   Occupational History  . Not on file.   Social History Main Topics  . Smoking status: Former Smoker    Quit date: 09/24/1994  . Smokeless tobacco: Never Used  . Alcohol use Yes     Comment: rarely  . Drug use: No  . Sexual activity: Yes    Birth control/ protection: None   Other Topics Concern  . Not on file   Social History Narrative  . No narrative on file    Family History  Problem Relation Age of Onset  . Cancer Sister 40       stomach  . Hypertension Mother   . Hypertension Father   . Breast cancer Maternal Aunt   . Breast cancer Cousin     Past Surgical History:  Procedure Laterality Date  . ABSCESS DRAINAGE    . BREAST BIOPSY Left    US Core bx, 2010?, pt not sure when    ROS: Review of Systems Negative except as stated above PHYSICAL EXAM: BP 130/90   Pulse (!) 111   Temp 98.1 F (36.7 C) (Oral)   Resp 16   Wt 240 lb 3.2 oz (109 kg)   SpO2 95%   BMI 45.35 kg/m   Repeat blood pressure 138/90 Physical Exam General appearance - alert, well appearing, and in no distress Mental status - alert, oriented to person, place, and time, normal mood, behavior, speech, dress, motor  activity, and thought processes Chest - clear to auscultation, no wheezes, rales or rhonchi, symmetric air entry Heart - normal rate, regular rhythm, normal S1, S2, no murmurs, rubs, clicks or gallops Musculoskeletal - knees: Large body habitus. Left knee -Mild tenderness on palpation of medial joint line and directly over the patella. Mild crepitus on passive range of motion.  Right knee- no point tenderness. Good range of motion  Extremities - no lower extremity edema  Skin - mild excoriation and hyperpigmentation below the lower lip onto the chin  ASSESSMENT AND PLAN: 1. Essential hypertension At goal. Continue DASH diet. Continue labetalol. - labetalol (NORMODYNE) 100 MG tablet; Take 0.5 tablets (50 mg total) by mouth 2 (two) times daily.  Dispense:  45 tablet; Refill: 2  2. Morbid obesity with BMI of 40.0-44.9, adult [facility]) -Encourage patient to continue to work on healthy eating habits. Avoid buying sweet treats. Discussed healthy snacks   3. Chronic pain of both knees -Advised to take Lopid every day We will give tramadol to use when necessary. Patient advised that this medication is a narcotic which carries the risk of dependence and addiction.  -Pain management agreement reviewed with patient and signed - meloxicam (MOBIC) 15 MG tablet; Take 1 tablet (15 mg total) by mouth daily.  Dispense: 90 tablet; Refill: 1 - traMADol (ULTRAM) 50 MG tablet; Take 1 tablet (50 mg total) by mouth every 8 (eight) hours as needed.  Dispense: 40 tablet; Refill: 2 - Ambulatory referral to Orthopedic Surgery - DG Knee Complete 4 Views Left; Future - DG Knee Complete 4 Views Right; Future  4. High triglycerides Will repeat lipid profile fasting. If triglycerides still elevated we will start gemfibrozil - Lipid panel; Future  5. Medication monitoring encounter - Drug Screen, Urine  6. Rash of face -Probably allergic or contact dermatitis. We will give a trial of hydrocortisone cream. Patient advised to use only for 3-4 days as continued use can cause thinning of the skin   Patient was given the opportunity to ask questions.  Patient verbalized understanding of the plan and was able to repeat key elements of the plan.   Orders Placed This Encounter  Procedures  . DG Knee Complete 4 Views Left  . DG Knee Complete 4 Views Right  . Lipid panel  . Drug Screen, Urine  . Ambulatory referral to Orthopedic Surgery     Requested Prescriptions   Signed Prescriptions Disp Refills  . meloxicam (MOBIC) 15 MG tablet 90 tablet 1    Sig: Take 1 tablet (15 mg total) by mouth daily.  . traMADol (ULTRAM) 50 MG tablet 40 tablet 2    Sig: Take 1 tablet (50 mg total) by mouth every 8 (eight) hours as needed.  . hydrocortisone 2.5 % cream 30 g 0    Sig: Apply  topically daily. For 4 days  . labetalol (NORMODYNE) 100 MG tablet 45 tablet 2    Sig: Take 0.5 tablets (50 mg total) by mouth 2 (two) times daily.    No Follow-up on file.  Jonah Blue, MD, FACP

## 2016-12-28 NOTE — Progress Notes (Signed)
Pt is having knee pain Pt states her pain makes her cry and it is hard for her to walk Pt states her pain is 10/10

## 2016-12-28 NOTE — Patient Instructions (Signed)
Take the Meloxicam daily. Take Tramadol as needed.

## 2016-12-29 DIAGNOSIS — Z5181 Encounter for therapeutic drug level monitoring: Secondary | ICD-10-CM | POA: Insufficient documentation

## 2016-12-29 DIAGNOSIS — E781 Pure hyperglyceridemia: Secondary | ICD-10-CM | POA: Insufficient documentation

## 2016-12-29 LAB — DRUG SCREEN, URINE
Amphetamines, Urine: NEGATIVE ng/mL
BARBITURATE SCREEN URINE: NEGATIVE ng/mL
BENZODIAZEPINE QUANT UR: NEGATIVE ng/mL
CANNABINOID QUANT UR: NEGATIVE ng/mL
COCAINE (METAB.): NEGATIVE ng/mL
OPIATE QUANT UR: NEGATIVE ng/mL
PCP Quant, Ur: NEGATIVE ng/mL

## 2016-12-29 NOTE — Assessment & Plan Note (Signed)
Pain Management agreement signed 12/28/2016

## 2017-01-01 ENCOUNTER — Ambulatory Visit: Payer: Self-pay | Attending: Internal Medicine

## 2017-01-01 DIAGNOSIS — E781 Pure hyperglyceridemia: Secondary | ICD-10-CM

## 2017-01-02 LAB — LIPID PANEL
CHOLESTEROL TOTAL: 209 mg/dL — AB (ref 100–199)
Chol/HDL Ratio: 4.4 ratio (ref 0.0–4.4)
HDL: 47 mg/dL (ref >39)
LDL CALC: 117 mg/dL — AB (ref 0–99)
Triglycerides: 226 mg/dL — ABNORMAL HIGH (ref 0–149)
VLDL CHOLESTEROL CAL: 45 mg/dL — AB (ref 5–40)

## 2017-01-04 ENCOUNTER — Telehealth: Payer: Self-pay | Admitting: Internal Medicine

## 2017-01-04 DIAGNOSIS — R21 Rash and other nonspecific skin eruption: Secondary | ICD-10-CM

## 2017-01-04 NOTE — Telephone Encounter (Signed)
Contacted pt to go over lab results pt didn't answer lvm informing pt of results and to inform her that I will forward Dr. Laural BenesJohnson this message to put the referral in. I informed pt that if she has any questions or concerns to please give us a call  If pt calls back please give results: Let pt know that her total cholesterol and triglyceride levels have improved. Continue to work on healthy eating habits and regular exercise

## 2017-01-04 NOTE — Telephone Encounter (Signed)
Pt called requesting lab results and states she needs dermatology referral states cream prescribed is not working for her. Please advise

## 2017-01-05 NOTE — Telephone Encounter (Signed)
Referral submitted for derm referral.

## 2017-01-06 ENCOUNTER — Telehealth: Payer: Self-pay | Admitting: Internal Medicine

## 2017-01-06 NOTE — Telephone Encounter (Signed)
Patient called back wanting to review lab results , pt is concerned and want to know exactly what level her triglycerides are ,and wanting more detailed results. Please f/up

## 2017-01-08 NOTE — Telephone Encounter (Signed)
Attempt to call back, no answer. Left message   On voicemail.

## 2017-01-11 ENCOUNTER — Ambulatory Visit (INDEPENDENT_AMBULATORY_CARE_PROVIDER_SITE_OTHER): Payer: Self-pay

## 2017-01-11 ENCOUNTER — Telehealth: Payer: Self-pay

## 2017-01-11 ENCOUNTER — Ambulatory Visit (INDEPENDENT_AMBULATORY_CARE_PROVIDER_SITE_OTHER): Payer: Self-pay | Admitting: Orthopaedic Surgery

## 2017-01-11 DIAGNOSIS — M25562 Pain in left knee: Secondary | ICD-10-CM

## 2017-01-11 DIAGNOSIS — M25561 Pain in right knee: Secondary | ICD-10-CM

## 2017-01-11 DIAGNOSIS — G8929 Other chronic pain: Secondary | ICD-10-CM

## 2017-01-11 MED ORDER — METHYLPREDNISOLONE ACETATE 40 MG/ML IJ SUSP
40.0000 mg | INTRAMUSCULAR | Status: AC | PRN
  Administered 2017-01-11: 40 mg via INTRA_ARTICULAR

## 2017-01-11 MED ORDER — LIDOCAINE HCL 1 % IJ SOLN
2.0000 mL | INTRAMUSCULAR | Status: AC | PRN
  Administered 2017-01-11: 2 mL

## 2017-01-11 MED ORDER — BUPIVACAINE HCL 0.5 % IJ SOLN
2.0000 mL | INTRAMUSCULAR | Status: AC | PRN
  Administered 2017-01-11: 2 mL via INTRA_ARTICULAR

## 2017-01-11 NOTE — Telephone Encounter (Signed)
Pt came into office requesting lab results, reviewed lab results with patient and printed copy for patient. Pt understood and did not have any additional questions.

## 2017-01-11 NOTE — Progress Notes (Signed)
Office Visit Note   Patient: Alexis Mooney           Date of Birth: August 06, 1965           MRN: [id number] Visit Date: 01/11/2017              Requested by: Marcine Matar, MD [address] [place], [place] [address] PCP: Marcine Matar, MD   Assessment & Plan: Visit Diagnoses:  1. Chronic pain of both knees     Plan: Overall impression is degenerative joint disease bilateral knees. Bilateral knees were injected with cortisone. Recommend physical therapy and weight loss. Patient is not interested in knee replacement at this time. Total face to face encounter time was greater than 45 minutes and over half of this time was spent in counseling and/or coordination of care.  Follow-Up Instructions: Return if symptoms worsen or fail to improve.   Orders:  Orders Placed This Encounter  Procedures  . XR KNEE 3 VIEW RIGHT  . XR KNEE 3 VIEW LEFT   No orders of the defined types were placed in this encounter.     Procedures: Large Joint Inj Date/Time: 01/11/2017 11:32 AM Performed by: Tarry Kos Authorized by: Tarry Kos   Consent Given by:  Patient Timeout: prior to procedure the correct patient, procedure, and site was verified   Indications:  Pain Location:  Knee Site:  R knee Prep: patient was prepped and draped in usual sterile fashion   Needle Size:  22 G Ultrasound Guidance: No   Fluoroscopic Guidance: No   Arthrogram: No   Patient tolerance:  Patient tolerated the procedure well with no immediate complications Large Joint Inj Date/Time: 01/11/2017 11:32 AM Performed by: Tarry Kos Authorized by: Tarry Kos   Consent Given by:  Patient Timeout: prior to procedure the correct patient, procedure, and site was verified   Indications:  Pain Location:  Knee Site:  L knee Prep: patient was prepped and draped in usual sterile fashion   Needle Size:  22 G Ultrasound Guidance: No   Fluoroscopic Guidance: No   Arthrogram: No   Medications:  2  mL lidocaine 1 %; 2 mL bupivacaine 0.5 %; 40 mg methylPREDNISolone acetate 40 MG/ML Patient tolerance:  Patient tolerated the procedure well with no immediate complications     Clinical Data: No additional findings.   Subjective: Chief Complaint  Patient presents with  . Right Knee - Pain  . Left Knee - Pain    Patient comes in today for bilateral knee pain worse on the left. She is had chronic pain for years now. Meloxicam does help. She has trouble with activity. She states she feels like something stabbing her. Denies any double or tingling. She has been out of work since October.    Review of Systems  Constitutional: Negative.   HENT: Negative.   Eyes: Negative.   Respiratory: Negative.   Cardiovascular: Negative.   Endocrine: Negative.   Musculoskeletal: Negative.   Neurological: Negative.   Hematological: Negative.   Psychiatric/Behavioral: Negative.   All other systems reviewed and are negative.    Objective: Vital Signs: There were no vitals taken for this visit.  Physical Exam  Constitutional: She is oriented to person, place, and time. She appears well-developed and well-nourished.  HENT:  Head: Normocephalic and atraumatic.  Eyes: EOM are normal.  Neck: Neck supple.  Pulmonary/Chest: Effort normal.  Abdominal: Soft.  Neurological: She is alert and oriented to person, place, and time.  Skin: Skin is warm. Capillary refill takes less than 2 seconds.  Psychiatric: She has a normal mood and affect. Her behavior is normal. Judgment and thought content normal.  Nursing note and vitals reviewed.   Ortho Exam Following the exam shows no joint effusion. Collaterals and cruciates are stable. Positive patellar crepitus. Specialty Comments:  No specialty comments available.  Imaging: No results found.   PMFS History: Patient Active Problem List   Diagnosis Date Noted  . High triglycerides 12/29/2016  . Medication monitoring encounter 12/29/2016  .  Morbid obesity with BMI of 40.0-44.9, adult (HCC) 10/19/2016  . Perimenopause 01/22/2014  . Essential hypertension 01/22/2014   Past Medical History:  Diagnosis Date  . Abscess   . Hypertension   . Miscarriage   . Pollen allergies     Family History  Problem Relation Age of Onset  . Cancer Sister 40       stomach  . Hypertension Mother   . Hypertension Father   . Breast cancer Maternal Aunt   . Breast cancer Cousin     Past Surgical History:  Procedure Laterality Date  . ABSCESS DRAINAGE    . BREAST BIOPSY Left    US Core bx, 2010?, pt not sure when   Social History   Occupational History  . Not on file.   Social History Main Topics  . Smoking status: Former Smoker    Quit date: 09/24/1994  . Smokeless tobacco: Never Used  . Alcohol use Yes     Comment: rarely  . Drug use: No  . Sexual activity: Yes    Birth control/ protection: None

## 2017-01-11 NOTE — Telephone Encounter (Signed)
Contacted pt to go over lab results pt didn't answer lvm asking pt to give me a call at her earliest convenience   If pt calls back please give results: total cholesterol and triglyceride levels have improved. Continue to work on healthy eating habits and regular exercise.

## 2017-01-15 ENCOUNTER — Telehealth: Payer: Self-pay | Admitting: Internal Medicine

## 2017-01-15 MED FILL — LABETALOL HCL 100 MG TABLET: 100 | 30 days supply | Qty: 30 | Fill #2

## 2017-01-15 MED FILL — traMADol HCL 50 MG TABS: 50 | 13 days supply | Qty: 40 | Fill #1

## 2017-01-15 MED FILL — ?MELOXICAM 15MG TABLET: 15 | 30 days supply | Qty: 30 | Fill #2

## 2017-01-15 NOTE — Telephone Encounter (Signed)
Will forward to pcp

## 2017-01-15 NOTE — Telephone Encounter (Signed)
Patient came into office requesting advice on hydrocortisone 2.5 % cream, pt would like to know if she is able to continue using cream she was advised to only use for 4 days but only noticed a little improvement . Please f/up

## 2017-01-18 ENCOUNTER — Ambulatory Visit: Payer: Self-pay | Admitting: Internal Medicine

## 2017-02-18 ENCOUNTER — Telehealth: Payer: Self-pay | Admitting: Internal Medicine

## 2017-02-18 DIAGNOSIS — H538 Other visual disturbances: Secondary | ICD-10-CM

## 2017-02-18 MED FILL — LABETALOL HCL 100 MG TABLET: 100 | 30 days supply | Qty: 30 | Fill #3

## 2017-02-18 MED FILL — traMADol HCL 50 MG TABS: 50 | 13 days supply | Qty: 40 | Fill #2

## 2017-02-18 MED FILL — ?MELOXICAM 15MG TABLET: 15 | 30 days supply | Qty: 30 | Fill #3

## 2017-02-18 NOTE — Telephone Encounter (Signed)
PT called to request to referral to Ophtalmology since she having problem see, please follo wup

## 2017-02-26 ENCOUNTER — Telehealth: Payer: Self-pay | Admitting: Internal Medicine

## 2017-02-26 DIAGNOSIS — I10 Essential (primary) hypertension: Secondary | ICD-10-CM

## 2017-02-26 DIAGNOSIS — G8929 Other chronic pain: Secondary | ICD-10-CM

## 2017-02-26 DIAGNOSIS — M25561 Pain in right knee: Principal | ICD-10-CM

## 2017-02-26 DIAGNOSIS — M25562 Pain in left knee: Principal | ICD-10-CM

## 2017-02-26 NOTE — Telephone Encounter (Signed)
Pt came into office requesting medication refill on  traMADol (ULTRAM) 50 MG tablet labetalol (NORMODYNE) 100 MG tablet meloxicam (MOBIC) 15 MG tablet  Please f/up

## 2017-02-28 MED ORDER — MELOXICAM 15 MG PO TABS: 15.0000 mg | ORAL_TABLET | Freq: Every day | ORAL | 6 refills | Status: DC

## 2017-03-02 ENCOUNTER — Other Ambulatory Visit: Payer: Self-pay | Admitting: Internal Medicine

## 2017-03-02 DIAGNOSIS — M25562 Pain in left knee: Principal | ICD-10-CM

## 2017-03-02 DIAGNOSIS — G8929 Other chronic pain: Secondary | ICD-10-CM

## 2017-03-02 DIAGNOSIS — M25561 Pain in right knee: Principal | ICD-10-CM

## 2017-03-02 MED ORDER — TRAMADOL HCL 50 MG PO TABS: 50.0000 mg | ORAL_TABLET | Freq: Three times a day (TID) | ORAL | 2 refills | Status: DC | PRN

## 2017-03-02 MED ORDER — LABETALOL HCL 100 MG PO TABS: 50.0000 mg | ORAL_TABLET | Freq: Two times a day (BID) | ORAL | 0 refills | Status: DC

## 2017-03-02 NOTE — Telephone Encounter (Signed)
Pt. Called requesting a refill on   labetalol (NORMODYNE) 100 MG tablet meloxicam (MOBIC) 15 MG tablet  Pt. Uses [facility] pharmacy. Please f/u

## 2017-03-02 NOTE — Telephone Encounter (Signed)
Labetalol refilled. Meloxicam was refilled 02/28/17.

## 2017-03-02 NOTE — Telephone Encounter (Signed)
Contacted pt and lvm informing pt that her tramadol rx ready for pickup at the front desk

## 2017-03-17 ENCOUNTER — Other Ambulatory Visit: Payer: Self-pay | Admitting: Internal Medicine

## 2017-03-17 ENCOUNTER — Telehealth: Payer: Self-pay | Admitting: Internal Medicine

## 2017-03-17 DIAGNOSIS — M25561 Pain in right knee: Principal | ICD-10-CM

## 2017-03-17 DIAGNOSIS — G8929 Other chronic pain: Secondary | ICD-10-CM

## 2017-03-17 DIAGNOSIS — M25562 Pain in left knee: Principal | ICD-10-CM

## 2017-03-17 MED FILL — LABETALOL HCL 100 MG TABLET: 100 | 30 days supply | Qty: 30 | Fill #0

## 2017-03-17 MED FILL — ?MELOXICAM 15MG TABLET: 15 | 30 days supply | Qty: 30 | Fill #0

## 2017-03-17 NOTE — Telephone Encounter (Signed)
error 

## 2017-03-19 ENCOUNTER — Ambulatory Visit: Payer: Self-pay | Attending: Internal Medicine | Admitting: Internal Medicine

## 2017-03-19 ENCOUNTER — Ambulatory Visit: Payer: Self-pay | Attending: Internal Medicine | Admitting: Licensed Clinical Social Worker

## 2017-03-19 ENCOUNTER — Encounter: Payer: Self-pay | Admitting: Internal Medicine

## 2017-03-19 VITALS — BP 128/99 | HR 77 | Temp 98.6°F | Resp 16 | Wt 233.8 lb

## 2017-03-19 DIAGNOSIS — L659 Nonscarring hair loss, unspecified: Secondary | ICD-10-CM | POA: Insufficient documentation

## 2017-03-19 DIAGNOSIS — F32A Depression, unspecified: Secondary | ICD-10-CM | POA: Insufficient documentation

## 2017-03-19 DIAGNOSIS — R1013 Epigastric pain: Secondary | ICD-10-CM | POA: Insufficient documentation

## 2017-03-19 DIAGNOSIS — F419 Anxiety disorder, unspecified: Secondary | ICD-10-CM | POA: Insufficient documentation

## 2017-03-19 DIAGNOSIS — R109 Unspecified abdominal pain: Secondary | ICD-10-CM | POA: Insufficient documentation

## 2017-03-19 DIAGNOSIS — Z87891 Personal history of nicotine dependence: Secondary | ICD-10-CM | POA: Insufficient documentation

## 2017-03-19 DIAGNOSIS — Z880 Allergy status to penicillin: Secondary | ICD-10-CM | POA: Insufficient documentation

## 2017-03-19 DIAGNOSIS — Z6841 Body Mass Index (BMI) 40.0 and over, adult: Secondary | ICD-10-CM | POA: Insufficient documentation

## 2017-03-19 DIAGNOSIS — F329 Major depressive disorder, single episode, unspecified: Secondary | ICD-10-CM | POA: Insufficient documentation

## 2017-03-19 DIAGNOSIS — M170 Bilateral primary osteoarthritis of knee: Secondary | ICD-10-CM | POA: Insufficient documentation

## 2017-03-19 DIAGNOSIS — E785 Hyperlipidemia, unspecified: Secondary | ICD-10-CM | POA: Insufficient documentation

## 2017-03-19 DIAGNOSIS — N951 Menopausal and female climacteric states: Secondary | ICD-10-CM

## 2017-03-19 DIAGNOSIS — Z79899 Other long term (current) drug therapy: Secondary | ICD-10-CM | POA: Insufficient documentation

## 2017-03-19 DIAGNOSIS — Z780 Asymptomatic menopausal state: Secondary | ICD-10-CM | POA: Insufficient documentation

## 2017-03-19 DIAGNOSIS — I10 Essential (primary) hypertension: Secondary | ICD-10-CM | POA: Insufficient documentation

## 2017-03-19 MED ORDER — SERTRALINE HCL 50 MG PO TABS: ORAL_TABLET | ORAL | 3 refills | Status: DC

## 2017-03-19 MED ORDER — OMEPRAZOLE 20 MG PO CPDR: 20.0000 mg | DELAYED_RELEASE_CAPSULE | Freq: Every day | ORAL | 3 refills | Status: DC

## 2017-03-19 MED FILL — OMEPRAZOLE DR 20 MG CAPSULE: 20 | 30 days supply | Qty: 30 | Fill #0

## 2017-03-19 MED FILL — traMADol HCL 50 MG TABS: 50 | 13 days supply | Qty: 40 | Fill #0

## 2017-03-19 MED FILL — SERTRALINE HCL 50 MG TABLET: 50 | 30 days supply | Qty: 30 | Fill #0
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
## 2017-03-19 NOTE — Patient Instructions (Signed)
Keep a food diary as discussed.   Use Pepto Bismol as needed for abdominal cramps and diarrhea.   Start Zoloft for depression/anxiety and hot flashes.

## 2017-03-19 NOTE — Progress Notes (Signed)
Patient ID: Alexis Mooney, female    DOB: 1966/03/05  MRN: [id number]  CC: Abdominal Pain   Subjective: Alexis Mooney is a 51 y.o. female who presents for chronic ds management. Last seen 12/2016. Her concerns today include:  History of HTN, morbid obesity, osteoarthritis of the knees, hyperlipidemia (TG)  1. "I worry and nervous because I feel my health is out of control." -thinks she going through menopause. Has hair loss when ever she takes a shower. + hot flashes frequently -no period since June. Prior to that she was skipping mths  2. Stomach has been upset x 1 mth -intermittent episodes of abdominal pains/cramps with soft to diarrhea stools. Does not associate with any particular types of foods. Does not eat greasy foods. Does not associate with dairy products.  -2 days ago she cramping, pain, vomiting and diarrhea after eating a meal. Yesterday she had similar episode but without vomiting  -she cooks only fresh foods and veggies; avoids pork has cut back on red meat. Also voids spicy and greasy foods -+bloating even when she has not eaten -lives with husband. He does not have similar symptoms No recent travel outside of [place].  -had sister who died of stomach CA and she is worried and nervous about that -Also lacks good support system. It is just herself and her spouse living here. States that other family and friends look them up only when they have money. Pt is tearful  Patient Active Problem List   Diagnosis Date Noted  . High triglycerides 12/29/2016  . Medication monitoring encounter 12/29/2016  . Morbid obesity with BMI of 40.0-44.9, adult (HCC) 10/19/2016  . Perimenopause 01/22/2014  . Essential hypertension 01/22/2014     Current Outpatient Prescriptions on File Prior to Visit  Medication Sig Dispense Refill  . hydrocortisone 2.5 % cream Apply topically daily. For 4 days 30 g 0  . labetalol (NORMODYNE) 100 MG tablet Take 0.5 tablets (50 mg total)  by mouth 2 (two) times daily. 45 tablet 0  . loratadine (CLARITIN) 10 MG tablet Take 1 tablet (10 mg total) by mouth daily. 30 tablet 11  . meloxicam (MOBIC) 15 MG tablet Take 1 tablet (15 mg total) by mouth daily. 30 tablet 6  . traMADol (ULTRAM) 50 MG tablet Take 1 tablet (50 mg total) by mouth every 8 (eight) hours as needed. 40 tablet 2   No current facility-administered medications on file prior to visit.     Allergies  Allergen Reactions  . Ampicillin Hives  . Bactrim [Sulfamethoxazole-Trimethoprim] Swelling    Social History   Social History  . Marital status: Single    Spouse name: N/A  . Number of children: N/A  . Years of education: N/A   Occupational History  . Not on file.   Social History Main Topics  . Smoking status: Former Smoker    Quit date: 09/24/1994  . Smokeless tobacco: Never Used  . Alcohol use Yes     Comment: rarely  . Drug use: No  . Sexual activity: Yes    Birth control/ protection: None   Other Topics Concern  . Not on file   Social History Narrative  . No narrative on file    Family History  Problem Relation Age of Onset  . Cancer Sister 40       stomach  . Hypertension Mother   . Hypertension Father   . Breast cancer Maternal Aunt   . Breast cancer Cousin     Past  Surgical History:  Procedure Laterality Date  . ABSCESS DRAINAGE    . BREAST BIOPSY Left    [place] Core bx, 2010?, pt not sure when    ROS: Review of Systems Negative except as stated above PHYSICAL EXAM: BP (!) 128/99   Pulse 77   Temp 98.6 F (37 C) (Oral)   Resp 16   Wt 233 lb 12.8 oz (106.1 kg)   SpO2 98%   BMI 44.14 kg/m   Wt Readings from Last 3 Encounters:  03/19/17 233 lb 12.8 oz (106.1 kg)  12/28/16 240 lb 3.2 oz (109 kg)  10/27/16 244 lb 12.8 oz (111 kg)   Physical Exam General appearance - middle-age Hispanic female in NAD. Patient is very tearful Mental status - alert, oriented to person, place, and time Mouth - mucous membranes moist,  pharynx normal without lesions Chest - clear to auscultation, no wheezes, rales or rhonchi, symmetric air entry Heart - normal rate, regular rhythm, normal S1, S2, no murmurs, rubs, clicks or gallops Abdomen -normal bowel sounds. Nondistended. Mild mid epigastric tenderness. No organomegaly. No guarding or rebound Extremities - peripheral pulses normal, no pedal edema, no clubbing or cyanosis Hair: thinning of hair temples and crown Depression screen Alexis Mooney 2/9 03/19/2017 12/28/2016 10/19/2016 01/04/2015  Decreased Interest 0  Down, Depressed, Hopeless 0  PHQ - 2 Score 0  Altered sleeping -  Tired, decreased energy -  Change in appetite 0 0 0 -  Feeling bad or failure about yourself  0 0 0 -  Trouble concentrating 3 3 0 -  Moving slowly or fidgety/restless 1 3 0 -  Suicidal thoughts 0 0 0 -  PHQ-9 Score -   GAD 7 : Generalized Anxiety Score 03/19/2017 12/28/2016 10/19/2016  Nervous, Anxious, on Edge (No Data) 3 2  Control/stop worrying (No Data) 1 2  Worry too much - different things (No Data) 3 2  Trouble relaxing (No Data) 2 2  Restless (No Data) 2 1  Easily annoyed or irritable (No Data) 3 1  Afraid - awful might happen (No Data) (No Data) 0  Total GAD 7 Score - - 10     Lab Results  Component Value Date   CHOL 209 (H) 01/01/2017   HDL 47 01/01/2017   LDLCALC 117 (H) 01/01/2017   TRIG 226 (H) 01/01/2017   CHOLHDL 4.4 01/01/2017    ASSESSMENT AND PLAN: 1. Perimenopausal symptoms -Discussed diagnosis of perimenopause and associated symptoms. We also need to make sure that some of her symptoms are not due to overactive thyroid (ie hair loss, wgh loss, diarrhea). Check TSH -discussed starting her on SSRI to help with dep/anxiety and hot flashes. Pt agreeable.  LCSW to see pt today - TSH - Follicle stimulating hormone - sertraline (ZOLOFT) 50 MG tablet; 1/2 tab daily x 2 weeks then 1 tab PO daily.  Dispense: 30 tablet; Refill: 3  2. Anxiety  and depression See #1 above - sertraline (ZOLOFT) 50 MG tablet; 1/2 tab daily x 2 weeks then 1 tab PO daily.  Dispense: 30 tablet; Refill: 3  3. Epigastric pain Advise keeping a food diary of things she ate before any future episode of abdominal cramps and diarrhea. Given bloating b/w meals, will check H.pylori and treat if pos.  -trail of Omeprazole in meal time Pepto Bismol OTC PRN - H. pylori breath test - omeprazole (PRILOSEC) 20 MG capsule;  Take 1 capsule (20 mg total) by mouth daily.  Dispense: 30 capsule; Refill: 3 - Comprehensive metabolic panel  4. Hair loss -due to being perimenopausal vs hyperthyroid -TSH  Patient was given the opportunity to ask questions.  Patient verbalized understanding of the plan and was able to repeat key elements of the plan.   Orders Placed This Encounter  Procedures  . H. pylori breath test  . TSH  . Follicle stimulating hormone  . Comprehensive metabolic panel     Requested Prescriptions   Signed Prescriptions Disp Refills  . omeprazole (PRILOSEC) 20 MG capsule 30 capsule 3    Sig: Take 1 capsule (20 mg total) by mouth daily.  . sertraline (ZOLOFT) 50 MG tablet 30 tablet 3    Sig: 1/2 tab daily x 2 weeks then 1 tab PO daily.    Return in about 1 month (around 04/19/2017).  Jonah Blue, MD, FACP

## 2017-03-21 LAB — COMPREHENSIVE METABOLIC PANEL
ALK PHOS: 77 IU/L (ref 39–117)
ALT: 22 IU/L (ref 0–32)
AST: 19 IU/L (ref 0–40)
Albumin/Globulin Ratio: 1.7 (ref 1.2–2.2)
Albumin: 4.5 g/dL (ref 3.5–5.5)
BUN/Creatinine Ratio: 15 (ref 9–23)
BUN: 7 mg/dL (ref 6–24)
Bilirubin Total: 0.4 mg/dL (ref 0.0–1.2)
CO2: 23 mmol/L (ref 20–29)
CREATININE: 0.48 mg/dL — AB (ref 0.57–1.00)
Calcium: 9.4 mg/dL (ref 8.7–10.2)
Chloride: 98 mmol/L (ref 96–106)
GFR calc Af Amer: 132 mL/min/{1.73_m2} (ref >59)
GFR calc non Af Amer: 115 mL/min/{1.73_m2} (ref >59)
GLOBULIN, TOTAL: 2.7 g/dL (ref 1.5–4.5)
Glucose: 99 mg/dL (ref 65–99)
POTASSIUM: 4.2 mmol/L (ref 3.5–5.2)
SODIUM: 139 mmol/L (ref 134–144)
Total Protein: 7.2 g/dL (ref 6.0–8.5)

## 2017-03-21 LAB — TSH: TSH: 2.31 u[IU]/mL (ref 0.450–4.500)

## 2017-03-21 LAB — FOLLICLE STIMULATING HORMONE: FSH: 29 m[IU]/mL

## 2017-03-23 LAB — H. PYLORI BREATH TEST: H pylori Breath Test: NEGATIVE

## 2017-03-24 ENCOUNTER — Telehealth: Payer: Self-pay | Admitting: Internal Medicine

## 2017-03-24 ENCOUNTER — Encounter (HOSPITAL_COMMUNITY): Payer: Self-pay

## 2017-03-24 ENCOUNTER — Telehealth: Payer: Self-pay

## 2017-03-24 NOTE — Telephone Encounter (Signed)
Will forward to pcp

## 2017-03-24 NOTE — Telephone Encounter (Signed)
Pt called back to review results, pt understood but states she still has cramping in her stomach and would like to know what would be the next step since test were normal. Please f/up

## 2017-03-24 NOTE — Telephone Encounter (Signed)
Contacted pt to go over results pt didn't answer lvm asking pt to give me a call at her earliest convenience  If pt calls back please give results: thyroid, kidney and liver function tests are normal and  H. Pylori is negative

## 2017-03-25 NOTE — Telephone Encounter (Signed)
Please schedule appointment with PCP or next available provider. Thanks

## 2017-03-26 NOTE — Telephone Encounter (Signed)
Alexis BosworthCarlos could you make an appointment

## 2017-03-29 ENCOUNTER — Ambulatory Visit: Payer: Self-pay | Attending: Internal Medicine | Admitting: Internal Medicine

## 2017-03-29 ENCOUNTER — Encounter: Payer: Self-pay | Admitting: Internal Medicine

## 2017-03-29 VITALS — BP 135/96 | HR 84 | Temp 98.2°F | Resp 16 | Wt 233.6 lb

## 2017-03-29 DIAGNOSIS — I10 Essential (primary) hypertension: Secondary | ICD-10-CM | POA: Insufficient documentation

## 2017-03-29 DIAGNOSIS — R6881 Early satiety: Secondary | ICD-10-CM | POA: Insufficient documentation

## 2017-03-29 DIAGNOSIS — Z882 Allergy status to sulfonamides status: Secondary | ICD-10-CM | POA: Insufficient documentation

## 2017-03-29 DIAGNOSIS — Z6841 Body Mass Index (BMI) 40.0 and over, adult: Secondary | ICD-10-CM | POA: Insufficient documentation

## 2017-03-29 DIAGNOSIS — F32A Depression, unspecified: Secondary | ICD-10-CM

## 2017-03-29 DIAGNOSIS — F329 Major depressive disorder, single episode, unspecified: Secondary | ICD-10-CM | POA: Insufficient documentation

## 2017-03-29 DIAGNOSIS — E785 Hyperlipidemia, unspecified: Secondary | ICD-10-CM | POA: Insufficient documentation

## 2017-03-29 DIAGNOSIS — Z880 Allergy status to penicillin: Secondary | ICD-10-CM | POA: Insufficient documentation

## 2017-03-29 DIAGNOSIS — F419 Anxiety disorder, unspecified: Secondary | ICD-10-CM | POA: Insufficient documentation

## 2017-03-29 DIAGNOSIS — E781 Pure hyperglyceridemia: Secondary | ICD-10-CM | POA: Insufficient documentation

## 2017-03-29 NOTE — Patient Instructions (Signed)
Please give pt an appointment with Ms. Boyd in 2 weeks.  Continue the Omeprazole and Zoloft.  We will refer you to gastroenterology.

## 2017-03-29 NOTE — Progress Notes (Signed)
Pt states her stomach pain is getting better Pt states her stomach feels heavy to where she can't eat  Pt has not taken medication

## 2017-03-29 NOTE — Progress Notes (Signed)
Patient ID: Alexis Mooney, female    DOB: 1966-01-30  MRN: [id number]  CC:  Follow-up of gastrointestinal issues  Subjective:  Alexis Mooney is a 51 y.o. female who presents for UC visit. Her concerns today include:  History of HTN, morbid obesity, osteoarthritis of the knees,hyperlipidemia (TG)  1. Last seen 10 days ago with complaints of abdominal pain/cramps with vomiting and diarrhea after meals.H. pylori test was negative, LFTs were normal .she was started on omeprazole. -Since then abdominal pain, diarrhea and vomiting have stopped -still has sensation of abdominal fullness "like heavy." Easy fullness,  Loss 7 lbs unintentionally since July. Had one episode of black stools associated with use of Pepto-Bismol several weeks ago prior to her last visit with me. -taking the Omeprazole  2. Anxiety/Depression: Tolerating the Zoloft. About to increase from 25 to the 50 mg tab -TSH was normal  3. Perimenopausal symptoms -Plan was to use Zoloft for both anxiety/depression and perimenopausal symptoms. FSH was in the perimenopausal range  Patient Active Problem List   Diagnosis Date Noted  . Anxiety and depression 03/19/2017  . Hair loss 03/19/2017  . High triglycerides 12/29/2016  . Medication monitoring encounter 12/29/2016  . Morbid obesity with BMI of 40.0-44.9, adult (HCC) 10/19/2016  . Perimenopause 01/22/2014  . Essential hypertension 01/22/2014     Current Outpatient Prescriptions on File Prior to Visit  Medication Sig Dispense Refill  . hydrocortisone 2.5 % cream Apply topically daily. For 4 days 30 g 0  . labetalol (NORMODYNE) 100 MG tablet Take 0.5 tablets (50 mg total) by mouth 2 (two) times daily. 45 tablet 0  . loratadine (CLARITIN) 10 MG tablet Take 1 tablet (10 mg total) by mouth daily. 30 tablet 11  . meloxicam (MOBIC) 15 MG tablet Take 1 tablet (15 mg total) by mouth daily. 30 tablet 6  . omeprazole (PRILOSEC) 20 MG capsule Take 1 capsule (20  mg total) by mouth daily. 30 capsule 3  . sertraline (ZOLOFT) 50 MG tablet 1/2 tab daily x 2 weeks then 1 tab PO daily. 30 tablet 3  . traMADol (ULTRAM) 50 MG tablet Take 1 tablet (50 mg total) by mouth every 8 (eight) hours as needed. 40 tablet 2   No current facility-administered medications on file prior to visit.     Allergies  Allergen Reactions  . Ampicillin Hives  . Bactrim [Sulfamethoxazole-Trimethoprim] Swelling     ROS: Review of Systems Negative except as stated above BP noted to be elevated. She has not taken labetalol as yet for the morning  PHYSICAL EXAM: BP (!) 135/96   Pulse 84   Temp 98.2 F (36.8 C) (Oral)   Resp 16   Wt 233 lb 9.6 oz (106 kg)   SpO2 97%   BMI 44.10 kg/m   Wt Readings from Last 3 Encounters:  03/29/17 233 lb 9.6 oz (106 kg)  03/19/17 233 lb 12.8 oz (106.1 kg)  12/28/16 240 lb 3.2 oz (109 kg)    Physical Exam  General appearance - alert, well appearing, and in no distress Mental status - alert, oriented to person, place, and time, normal mood, behavior, speech, dress, motor activity, and thought processes Eyes - pink conjunctiva Abdomen - soft, nontender, nondistended, no masses or organomegaly  ASSESSMENT AND PLAN: 1. Early satiety -This in combination with unintentional weight loss and one episode of black stools more than a month ago is concerning. She will continue omeprazole. GI referral. She plans to sign up again for [facility] -  CBC - Ambulatory referral to Gastroenterology  2. Anxiety and depression Tolerating Zoloft  3. Essential hypertension Not at goal. Patient has not taken labetalol as yet for the morning.  Patient was given the opportunity to ask questions.  Patient verbalized understanding of the plan and was able to repeat key elements of the plan.   Orders Placed This Encounter  Procedures  . CBC  . Ambulatory referral to Gastroenterology     Requested Prescriptions    No prescriptions  requested or ordered in this encounter    No future appointments.  Jonah Blueeborah Johnson, MD, FACP

## 2017-03-30 LAB — CBC
HEMATOCRIT: 42.1 % (ref 34.0–46.6)
Hemoglobin: 14.4 g/dL (ref 11.1–15.9)
MCH: 29.1 pg (ref 26.6–33.0)
MCHC: 34.2 g/dL (ref 31.5–35.7)
MCV: 85 fL (ref 79–97)
PLATELETS: 275 10*3/uL (ref 150–379)
RBC: 4.95 x10E6/uL (ref 3.77–5.28)
RDW: 14.5 % (ref 12.3–15.4)
WBC: 6.7 10*3/uL (ref 3.4–10.8)

## 2017-04-14 ENCOUNTER — Telehealth: Payer: Self-pay | Admitting: Internal Medicine

## 2017-04-14 NOTE — Telephone Encounter (Signed)
Pt called to speak with the financial dept. Since she has some concern, please follow up

## 2017-05-03 MED FILL — MELOXICAM 15 MG TABLET: 15 | 30 days supply | Qty: 30 | Fill #1

## 2017-05-03 MED FILL — traMADol HCL 50 MG TABS: 50 | 13 days supply | Qty: 40 | Fill #1

## 2017-05-03 MED FILL — LABETALOL HCL 100 MG TABLET: 100 | 15 days supply | Qty: 15 | Fill #1

## 2017-05-03 MED FILL — SERTRALINE HCL 50 MG TABLET: 50 | 30 days supply | Qty: 30 | Fill #1

## 2017-05-05 ENCOUNTER — Ambulatory Visit: Payer: Self-pay | Attending: Internal Medicine

## 2017-05-24 ENCOUNTER — Ambulatory Visit: Payer: Self-pay | Attending: Internal Medicine | Admitting: Physician Assistant

## 2017-05-24 VITALS — BP 133/95 | HR 76 | Temp 98.3°F | Wt 225.4 lb

## 2017-05-24 DIAGNOSIS — L02412 Cutaneous abscess of left axilla: Secondary | ICD-10-CM | POA: Insufficient documentation

## 2017-05-24 DIAGNOSIS — B351 Tinea unguium: Secondary | ICD-10-CM | POA: Insufficient documentation

## 2017-05-24 DIAGNOSIS — I10 Essential (primary) hypertension: Secondary | ICD-10-CM | POA: Insufficient documentation

## 2017-05-24 DIAGNOSIS — L02213 Cutaneous abscess of chest wall: Secondary | ICD-10-CM | POA: Insufficient documentation

## 2017-05-24 DIAGNOSIS — N760 Acute vaginitis: Secondary | ICD-10-CM | POA: Insufficient documentation

## 2017-05-24 DIAGNOSIS — Z79899 Other long term (current) drug therapy: Secondary | ICD-10-CM | POA: Insufficient documentation

## 2017-05-24 MED ORDER — TERBINAFINE HCL 250 MG PO TABS: 250.0000 mg | ORAL_TABLET | Freq: Every day | ORAL | 2 refills | Status: DC

## 2017-05-24 MED ORDER — DOXYCYCLINE HYCLATE 100 MG PO TABS: 100.0000 mg | ORAL_TABLET | Freq: Two times a day (BID) | ORAL | 0 refills | Status: DC

## 2017-05-24 MED ORDER — MUPIROCIN 2 % EX OINT: 1.0000 "application " | TOPICAL_OINTMENT | Freq: Two times a day (BID) | CUTANEOUS | 0 refills | Status: DC

## 2017-05-24 MED FILL — MUPIROCIN 2% OINTMENT: 2 | 12 days supply | Qty: 22 | Fill #0

## 2017-05-24 MED FILL — ?DOXYCYCLINE 100MG TABLET: 100 | 10 days supply | Qty: 20 | Fill #0

## 2017-05-24 MED FILL — TERBINAFINE HCL 250 MG TABS: 250 | 30 days supply | Qty: 30 | Fill #0

## 2017-05-24 NOTE — Progress Notes (Signed)
Pt has 3 abscess Chest Left axilla Vagina Pain 10/10 Sx's onset 2 weeks ago Nail fungus

## 2017-05-24 NOTE — Progress Notes (Signed)
Patient ID: Alexis Mooney, female   DOB: 05/04/1966, 51 y.o.   MRN: [id number]     Alexis Mooney, is a 51 y.o. female  [id number]  [id number]  DOB - 05/01/1966  Subjective:  Chief Complaint and HPI: Alexis Mooney is a 51 y.o. female here today for Abscess vagina. L arm, chest X 2-3 weeks that are worsening.  Areas were small and tender at first and now more painful.  No f/c.  No s/sx of systemic illness.  She has had these before and had one infected cyst surgically removed from her L axilla.   Also wants to have toenails looked at and thinks she has fungal infection.  CMP from October reviewed and LFTS were normal  ROS:   Constitutional:  No f/c, No night sweats, No unexplained weight loss. EENT:  No vision changes, No blurry vision, No hearing changes. No mouth, throat, or ear problems.  Respiratory: No cough, No SOB Cardiac: No CP, no palpitations GI:  No abd pain, No N/V/D. GU: No Urinary s/sx Musculoskeletal: No joint pain Neuro: No headache, no dizziness, no motor weakness.  Skin: No rash Endocrine:  No polydipsia. No polyuria.  Psych: Denies SI/HI  No problems updated.  ALLERGIES: Allergies  Allergen Reactions  . Ampicillin Hives  . Bactrim [Sulfamethoxazole-Trimethoprim] Swelling    PAST MEDICAL HISTORY: Past Medical History:  Diagnosis Date  . Abscess   . Hypertension   . Miscarriage   . Pollen allergies     MEDICATIONS AT HOME: Prior to Admission medications   Medication Sig Start Date End Date Taking? Authorizing Provider  labetalol (NORMODYNE) 100 MG tablet Take 0.5 tablets (50 mg total) by mouth 2 (two) times daily. 03/02/17  Yes Marcine MatarJohnson, Deborah B, MD  loratadine (CLARITIN) 10 MG tablet Take 1 tablet (10 mg total) by mouth daily. 01/04/15  Yes Ambrose FinlandKeck, Valerie A, NP  meloxicam (MOBIC) 15 MG tablet Take 1 tablet (15 mg total) by mouth daily. 02/28/17  Yes Marcine MatarJohnson, Deborah B, MD  omeprazole (PRILOSEC) 20 MG capsule Take 1  capsule (20 mg total) by mouth daily. 03/19/17  Yes Marcine MatarJohnson, Deborah B, MD  sertraline (ZOLOFT) 50 MG tablet 1/2 tab daily x 2 weeks then 1 tab PO daily. 03/19/17  Yes Marcine MatarJohnson, Deborah B, MD  traMADol (ULTRAM) 50 MG tablet Take 1 tablet (50 mg total) by mouth every 8 (eight) hours as needed. 03/02/17  Yes Marcine MatarJohnson, Deborah B, MD  doxycycline (VIBRA-TABS) 100 MG tablet Take 1 tablet (100 mg total) by mouth 2 (two) times daily. 05/24/17   Anders SimmondsMcClung, Shade Rivenbark M, PA-C  hydrocortisone 2.5 % cream Apply topically daily. For 4 days 12/28/16   Marcine MatarJohnson, Deborah B, MD  mupirocin ointment (BACTROBAN) 2 % Place 1 application into the nose 2 (two) times daily. 05/24/17   Anders SimmondsMcClung, Margues Filippini M, PA-C  terbinafine (LAMISIL) 250 MG tablet Take 1 tablet (250 mg total) by mouth daily. 05/24/17   Anders SimmondsMcClung, Tabia Landowski M, PA-C     Objective:  EXAM:   Vitals:   05/24/17 1040  BP: (!) 133/95  Pulse: 76  Temp: 98.3 F (36.8 C)  TempSrc: Oral  SpO2: 99%  Weight: 225 lb 6.4 oz (102.2 kg)    General appearance : A&OX3. NAD. Non-toxic-appearing HEENT: Atraumatic and Normocephalic.  PERRLA. EOM intact.   Neck: supple, no JVD. No cervical lymphadenopathy. No thyromegaly Chest/Lungs:  Breathing-non-labored, Good air entry bilaterally, breath sounds normal without rales, rhonchi, or wheezing  CVS: S1 S2 regular, no murmurs, gallops, rubs.  Mid-chest-firm and  indurated 2X3cm abscess with erythema, there is a central puncta present. L axilla-scarring present from previous cyst excision-<1cm soft and fluctuant and TTP abscess Vulva-R labia majora/minora area <1cm soft and TTP, fluctuant abscess w/o erythema.   Extremities: Bilateral Lower Ext shows no edema, both legs are warm to touch with = pulse throughout Neurology:  CN II-XII grossly intact, Non focal.   Psych:  TP linear. J/I WNL. Normal speech. Appropriate eye contact and affect.  Skin:  No Rash.  Nails of great toes with thickened nail and yellowish nails  Data Review Lab  Results  Component Value Date   HGBA1C 6.1 (H) 12/11/2015   HGBA1C 6.3 (H) 01/04/2015     Assessment & Plan   1. A.Abscess of chest wall B.R Labia and C. L axilla - doxycycline (VIBRA-TABS) 100 MG tablet; Take 1 tablet (100 mg total) by mouth 2 (two) times daily.  Dispense: 20 tablet; Refill: 0 - mupirocin ointment (BACTROBAN) 2 %; Place 1 application into the nose 2 (two) times daily.  Dispense: 22 g; Refill: 0 - Ambulatory referral to General Surgery  2. Toenail fungus Reviewed CMP 03/2017 and LFT were normal - terbinafine (LAMISIL) 250 MG tablet; Take 1 tablet (250 mg total) by mouth daily.  Dispense: 30 tablet; Refill: 2  3. Essential hypertension Controlled-continue current regimen   Patient have been counseled extensively about nutrition and exercise  Return in about 3 months (around 08/22/2017) for Dr Laural BenesJohnson; htn f/up.  The patient was given clear instructions to go to ER or return to medical center if symptoms don't improve, worsen or new problems develop. The patient verbalized understanding. The patient was told to call to get lab results if they haven't heard anything in the next week.     Georgian CoAngela Legacie Dillingham, PA-C [facility] [place], [place] [phone]   05/24/2017, 11:11 AM

## 2017-05-27 MED FILL — traMADol HCL 50 MG TABS: 50 | 13 days supply | Qty: 40 | Fill #2

## 2017-05-27 MED FILL — MELOXICAM 15 MG TABLET: 15 | 30 days supply | Qty: 30 | Fill #2

## 2017-05-27 MED FILL — LABETALOL HCL 100 MG TABLET: 100 | 15 days supply | Qty: 15 | Fill #4

## 2017-05-27 MED FILL — SERTRALINE HCL 50 MG TABLET: 50 | 30 days supply | Qty: 30 | Fill #2

## 2017-06-07 ENCOUNTER — Encounter: Payer: Self-pay | Admitting: Internal Medicine

## 2017-06-15 ENCOUNTER — Telehealth: Payer: Self-pay | Admitting: Internal Medicine

## 2017-06-15 NOTE — Telephone Encounter (Signed)
Her infection is not getting better The name of the medication is  -doxycycline (VIBRA-TABS) 100 MG tablet  She wants to know if she can get something stronger or receive other options Please follow up

## 2017-06-15 NOTE — Telephone Encounter (Signed)
Pt called in regards to her antibiotics,she wants something stronger. Please follow up

## 2017-06-16 NOTE — Telephone Encounter (Signed)
Will route to nurse °

## 2017-06-17 ENCOUNTER — Other Ambulatory Visit: Payer: Self-pay

## 2017-06-17 ENCOUNTER — Telehealth: Payer: Self-pay | Admitting: Internal Medicine

## 2017-06-17 DIAGNOSIS — L02213 Cutaneous abscess of chest wall: Secondary | ICD-10-CM

## 2017-06-17 MED ORDER — DOXYCYCLINE HYCLATE 100 MG PO TABS: 100.0000 mg | ORAL_TABLET | Freq: Two times a day (BID) | ORAL | 0 refills | Status: DC

## 2017-06-17 MED FILL — ?DOXYCYCLINE 100MG TABLET: 100 | 10 days supply | Qty: 20 | Fill #0

## 2017-06-17 NOTE — Telephone Encounter (Signed)
Pt. Called requesting to speak with her nurse regarding the Rx for doxycycline (VIBRA-TABS) 100 MG tablet Pt. States that the medication helped a little but is requesting a stronger medication. Per nurse Debby BudJaya she can refill the same medication until pt. Can be seen by the General Surgery. [facility].

## 2017-06-17 NOTE — Telephone Encounter (Signed)
Pt saw Georgian CoAngela Mcclung on 05-24-17. Per Marylene LandAngela we can refill antibiotic and check status on referral. Pt contacted the office again and [place] spoke with her I informed her if she can let pt know that we can refill antibiotic and I will contact Arna Mediciora and check on the status of her referral. [facility]. [facility]

## 2017-06-21 ENCOUNTER — Other Ambulatory Visit: Payer: Self-pay | Admitting: Internal Medicine

## 2017-06-21 DIAGNOSIS — M25562 Pain in left knee: Principal | ICD-10-CM

## 2017-06-21 DIAGNOSIS — M25561 Pain in right knee: Principal | ICD-10-CM

## 2017-06-21 DIAGNOSIS — G8929 Other chronic pain: Secondary | ICD-10-CM

## 2017-06-28 ENCOUNTER — Other Ambulatory Visit: Payer: Self-pay | Admitting: Internal Medicine

## 2017-06-28 DIAGNOSIS — G8929 Other chronic pain: Secondary | ICD-10-CM

## 2017-06-28 DIAGNOSIS — M25561 Pain in right knee: Principal | ICD-10-CM

## 2017-06-28 DIAGNOSIS — M25562 Pain in left knee: Principal | ICD-10-CM

## 2017-06-29 ENCOUNTER — Other Ambulatory Visit: Payer: Self-pay | Admitting: Internal Medicine

## 2017-06-29 DIAGNOSIS — I10 Essential (primary) hypertension: Secondary | ICD-10-CM

## 2017-06-29 NOTE — Telephone Encounter (Signed)
Pt requesting RF on Tramadol. [place] and is appropriate.  Last seen 03/2017.   RF written.

## 2017-06-29 NOTE — Telephone Encounter (Signed)
Pt is aware that rx is for pickup at the front

## 2017-07-05 ENCOUNTER — Telehealth: Payer: Self-pay | Admitting: Internal Medicine

## 2017-07-05 ENCOUNTER — Other Ambulatory Visit: Payer: Self-pay | Admitting: Internal Medicine

## 2017-07-05 DIAGNOSIS — M25561 Pain in right knee: Principal | ICD-10-CM

## 2017-07-05 DIAGNOSIS — M25562 Pain in left knee: Principal | ICD-10-CM

## 2017-07-05 DIAGNOSIS — G8929 Other chronic pain: Secondary | ICD-10-CM

## 2017-07-05 NOTE — Telephone Encounter (Signed)
Patient called asking why her tramadol has been denied. Please fu with the patient as soon as possible. She is upset.

## 2017-07-07 NOTE — Telephone Encounter (Signed)
Contacted pt and left a detailed vm that her tramadol rx has been up front since 06/29/2017 and if she has any questions or concerns to give me a call

## 2017-07-08 ENCOUNTER — Other Ambulatory Visit: Payer: Self-pay | Admitting: Internal Medicine

## 2017-07-08 DIAGNOSIS — G8929 Other chronic pain: Secondary | ICD-10-CM

## 2017-07-08 DIAGNOSIS — M25562 Pain in left knee: Principal | ICD-10-CM

## 2017-07-08 DIAGNOSIS — M25561 Pain in right knee: Principal | ICD-10-CM

## 2017-07-14 ENCOUNTER — Telehealth (INDEPENDENT_AMBULATORY_CARE_PROVIDER_SITE_OTHER): Payer: Self-pay | Admitting: Orthopaedic Surgery

## 2017-07-14 ENCOUNTER — Other Ambulatory Visit: Payer: Self-pay | Admitting: Internal Medicine

## 2017-07-14 DIAGNOSIS — M25562 Pain in left knee: Principal | ICD-10-CM

## 2017-07-14 DIAGNOSIS — G8929 Other chronic pain: Secondary | ICD-10-CM

## 2017-07-14 DIAGNOSIS — M25561 Pain in right knee: Principal | ICD-10-CM

## 2017-07-14 MED FILL — LABETALOL HCL 100 MG TABLET: 100 | 30 days supply | Qty: 30 | Fill #0

## 2017-07-14 MED FILL — SERTRALINE HCL 50 MG TABLET: 50 | 30 days supply | Qty: 30 | Fill #3

## 2017-07-14 MED FILL — MELOXICAM 15 MG TABLET: 15 | 30 days supply | Qty: 30 | Fill #3

## 2017-07-14 NOTE — Telephone Encounter (Signed)
Yes she can come in for another round of cortisone injections.  I don't think she has insurance therefore HA injections are not an option unless she can pay out of pocket.

## 2017-07-14 NOTE — Telephone Encounter (Signed)
Last cortisone injection was given  01/11/17. See message below.

## 2017-07-14 NOTE — Telephone Encounter (Signed)
Tried to call patient no answer. Will try again next time. If she calls please advise on message below.

## 2017-07-14 NOTE — Telephone Encounter (Signed)
Patient called asked if she can schedule an appointment to get injections in both knees? Patient asked if she can get a different injection this time. Patient asked if she can get a call back to discuss what injection is better for her.The number to contact patient is [phone]

## 2017-07-15 NOTE — Telephone Encounter (Signed)
Not sure how much a cortisone injection would be that is why I gave her insurance department Number and she said they cannot help her.   Do you have any idea ?

## 2017-07-15 NOTE — Telephone Encounter (Signed)
I have looked this up, if we just do the planned injections, and no office visit charge, the cost is $565.00.  Her portion/25% is $141.25.  That is what she will owe.  We just need to make sure Dr Roda ShuttersXu knows these are planned injections, so we wouldn't charge an OV.

## 2017-07-15 NOTE — Telephone Encounter (Signed)
She will call ins dept to see how much cortisone injections will be for bil knees.

## 2017-07-15 NOTE — Telephone Encounter (Signed)
She has [facility] Discount 75% Letter so she needs a range of what she would have to pay on her part. The insurance department number she was given could not help her, they told her to call back here. If you call back today she asks that you leave a voicemail. # [phone]

## 2017-07-15 NOTE — Telephone Encounter (Signed)
Tried to call patient no answer. Will try again later 

## 2017-07-15 NOTE — Telephone Encounter (Signed)
She will call us back to make appt to get knee injs

## 2017-07-16 MED FILL — traMADol HCL 50 MG TABS: 50 | 13 days supply | Qty: 40 | Fill #0

## 2017-07-16 NOTE — Telephone Encounter (Signed)
Will like to have BIL knee injections per Toniann FailWendy $141.25 for both knees

## 2017-07-16 NOTE — Telephone Encounter (Signed)
Patient will call back to schedule appt.

## 2017-08-20 ENCOUNTER — Other Ambulatory Visit: Payer: Self-pay | Admitting: Internal Medicine

## 2017-08-20 DIAGNOSIS — F329 Major depressive disorder, single episode, unspecified: Secondary | ICD-10-CM

## 2017-08-20 DIAGNOSIS — F32A Depression, unspecified: Secondary | ICD-10-CM

## 2017-08-20 DIAGNOSIS — F419 Anxiety disorder, unspecified: Secondary | ICD-10-CM

## 2017-08-20 DIAGNOSIS — N951 Menopausal and female climacteric states: Secondary | ICD-10-CM

## 2017-08-20 MED FILL — LABETALOL HCL 100 MG TABLET: 100 | 30 days supply | Qty: 30 | Fill #1

## 2017-08-20 MED FILL — traMADol HCL 50 MG TABS: 50 | 13 days supply | Qty: 40 | Fill #1

## 2017-08-20 MED FILL — MELOXICAM 15 MG TABLET: 15 | 30 days supply | Qty: 30 | Fill #4

## 2017-08-20 MED FILL — SERTRALINE HCL 50 MG TABLET: 50 | 30 days supply | Qty: 30 | Fill #0

## 2017-09-01 ENCOUNTER — Ambulatory Visit: Payer: Self-pay

## 2017-09-03 ENCOUNTER — Telehealth: Payer: Self-pay

## 2017-09-03 NOTE — Telephone Encounter (Signed)
Called patient to let her know that e had received a referral from her PCP and that I was calling for me to schedule an appointment if she was interested. Patient asked me where we were [place] and [place] [facility] [place] [facility] Patient then stated that she appreciated my call but was not able to get here due to being really far from her home. I recommended for her to continue doing her yearly mammograms and to follow up with her PCP, just in case she needed antibiotic. She stated that she would. Patient had no further questions.

## 2017-09-20 ENCOUNTER — Ambulatory Visit: Payer: Self-pay

## 2017-09-20 ENCOUNTER — Ambulatory Visit: Payer: Self-pay | Attending: Internal Medicine | Admitting: Internal Medicine

## 2017-09-20 ENCOUNTER — Encounter: Payer: Self-pay | Admitting: Internal Medicine

## 2017-09-20 VITALS — BP 141/101 | HR 88 | Temp 98.9°F | Resp 16 | Wt 230.8 lb

## 2017-09-20 DIAGNOSIS — F329 Major depressive disorder, single episode, unspecified: Secondary | ICD-10-CM | POA: Insufficient documentation

## 2017-09-20 DIAGNOSIS — Z9889 Other specified postprocedural states: Secondary | ICD-10-CM | POA: Insufficient documentation

## 2017-09-20 DIAGNOSIS — M170 Bilateral primary osteoarthritis of knee: Secondary | ICD-10-CM | POA: Insufficient documentation

## 2017-09-20 DIAGNOSIS — L723 Sebaceous cyst: Secondary | ICD-10-CM | POA: Insufficient documentation

## 2017-09-20 DIAGNOSIS — Z87891 Personal history of nicotine dependence: Secondary | ICD-10-CM | POA: Insufficient documentation

## 2017-09-20 DIAGNOSIS — Z79899 Other long term (current) drug therapy: Secondary | ICD-10-CM | POA: Insufficient documentation

## 2017-09-20 DIAGNOSIS — E781 Pure hyperglyceridemia: Secondary | ICD-10-CM | POA: Insufficient documentation

## 2017-09-20 DIAGNOSIS — L089 Local infection of the skin and subcutaneous tissue, unspecified: Secondary | ICD-10-CM

## 2017-09-20 DIAGNOSIS — Z111 Encounter for screening for respiratory tuberculosis: Secondary | ICD-10-CM | POA: Insufficient documentation

## 2017-09-20 DIAGNOSIS — Z880 Allergy status to penicillin: Secondary | ICD-10-CM | POA: Insufficient documentation

## 2017-09-20 DIAGNOSIS — Z6841 Body Mass Index (BMI) 40.0 and over, adult: Secondary | ICD-10-CM | POA: Insufficient documentation

## 2017-09-20 DIAGNOSIS — Z8249 Family history of ischemic heart disease and other diseases of the circulatory system: Secondary | ICD-10-CM | POA: Insufficient documentation

## 2017-09-20 DIAGNOSIS — F419 Anxiety disorder, unspecified: Secondary | ICD-10-CM | POA: Insufficient documentation

## 2017-09-20 DIAGNOSIS — I10 Essential (primary) hypertension: Secondary | ICD-10-CM | POA: Insufficient documentation

## 2017-09-20 DIAGNOSIS — Z882 Allergy status to sulfonamides status: Secondary | ICD-10-CM | POA: Insufficient documentation

## 2017-09-20 MED ORDER — DOXYCYCLINE HYCLATE 100 MG PO TABS: 100.0000 mg | ORAL_TABLET | Freq: Two times a day (BID) | ORAL | 0 refills | Status: DC

## 2017-09-20 MED ORDER — LABETALOL HCL 100 MG PO TABS: ORAL_TABLET | ORAL | 5 refills | Status: DC

## 2017-09-20 MED ORDER — SERTRALINE HCL 50 MG PO TABS: 50.0000 mg | ORAL_TABLET | Freq: Every day | ORAL | 3 refills | Status: DC

## 2017-09-20 MED ORDER — TRAMADOL HCL 50 MG PO TABS: 50.0000 mg | ORAL_TABLET | Freq: Three times a day (TID) | ORAL | 0 refills | Status: DC | PRN

## 2017-09-20 MED ORDER — MELOXICAM 15 MG PO TABS: 15.0000 mg | ORAL_TABLET | Freq: Every day | ORAL | 6 refills | Status: DC

## 2017-09-20 MED FILL — LABETALOL HCL 100 MG TABS: 100 | 30 days supply | Qty: 30 | Fill #0

## 2017-09-20 MED FILL — MELOXICAM 15 MG TABLET: 15 | 30 days supply | Qty: 30 | Fill #0

## 2017-09-20 MED FILL — SERTRALINE HCL 50 MG TABS: 50 | 30 days supply | Qty: 30 | Fill #0

## 2017-09-20 MED FILL — DOXYCYCLINE HYCLATE 100 MG: 100 | 7 days supply | Qty: 14 | Fill #0

## 2017-09-20 NOTE — Progress Notes (Signed)
Patient ID: Alexis Mooney, female    DOB: 02-Mar-1966  MRN: [id number]  CC: ezema and Hypertension   Subjective: Alexis Mooney is a 52 y.o. female who presents urgent care/same day appointment. Her concerns today include:  History of HTN, morbid obesity, osteoarthritis of the knees,hyperlipidemia (TG)  1.  Patient complains of recurrent boil between the breasts.  Had similar episode several months ago for which she was prescribed antibiotics.  At that time the lesion burst on its own and she was able to express pus.  However the swelling never completely resolved.  Over the past 2 weeks swelling started increasing again with pain and redness.    2.  HTN: Blood pressure noted to be elevated.  Did not take med as yet for the morning.  Requests refill on labetalol.  She is also requesting refill on Zoloft which she takes for depression, tramadol which she is on for osteoarthritis of the knees and her cholesterol medicine.  Requested PPD for nursing class. Patient Active Problem List   Diagnosis Date Noted  . Anxiety and depression 03/19/2017  . Hair loss 03/19/2017  . High triglycerides 12/29/2016  . Medication monitoring encounter 12/29/2016  . Morbid obesity with BMI of 40.0-44.9, adult (HCC) 10/19/2016  . Perimenopause 01/22/2014  . Essential hypertension 01/22/2014     Current Outpatient Medications on File Prior to Visit  Medication Sig Dispense Refill  . hydrocortisone 2.5 % cream Apply topically daily. For 4 days 30 g 0  . loratadine (CLARITIN) 10 MG tablet Take 1 tablet (10 mg total) by mouth daily. 30 tablet 11  . mupirocin ointment (BACTROBAN) 2 % Place 1 application into the nose 2 (two) times daily. 22 g 0  . omeprazole (PRILOSEC) 20 MG capsule Take 1 capsule (20 mg total) by mouth daily. 30 capsule 3  . terbinafine (LAMISIL) 250 MG tablet Take 1 tablet (250 mg total) by mouth daily. 30 tablet 2   No current facility-administered medications on file  prior to visit.     Allergies  Allergen Reactions  . Ampicillin Hives  . Bactrim [Sulfamethoxazole-Trimethoprim] Swelling    Social History   Socioeconomic History  . Marital status: Single    Spouse name: Not on file  . Number of children: Not on file  . Years of education: Not on file  . Highest education level: Not on file  Occupational History  . Not on file  Social Needs  . Financial resource strain: Not on file  . Food insecurity:    Worry: Not on file    Inability: Not on file  . Transportation needs:    Medical: Not on file    Non-medical: Not on file  Tobacco Use  . Smoking status: Former Smoker    Last attempt to quit: 09/24/1994    Years since quitting: 23.0  . Smokeless tobacco: Never Used  Substance and Sexual Activity  . Alcohol use: Yes    Comment: rarely  . Drug use: No  . Sexual activity: Yes    Birth control/protection: None  Lifestyle  . Physical activity:    Days per week: Not on file    Minutes per session: Not on file  . Stress: Not on file  Relationships  . Social connections:    Talks on phone: Not on file    Gets together: Not on file    Attends religious service: Not on file    Active member of club or organization: Not on file  Attends meetings of clubs or organizations: Not on file    Relationship status: Not on file  . Intimate partner violence:    Fear of current or ex partner: Not on file    Emotionally abused: Not on file    Physically abused: Not on file    Forced sexual activity: Not on file  Other Topics Concern  . Not on file  Social History Narrative  . Not on file    Family History  Problem Relation Age of Onset  . Cancer Sister 40       stomach  . Hypertension Mother   . Hypertension Father   . Breast cancer Maternal Aunt   . Breast cancer Cousin     Past Surgical History:  Procedure Laterality Date  . ABSCESS DRAINAGE    . BREAST BIOPSY Left    [place] Core bx, 2010?, pt not sure when    ROS: Review of  Systems Negative except as stated above PHYSICAL EXAM: BP (!) 141/101   Pulse 88   Temp 98.9 F (37.2 C) (Oral)   Resp 16   Wt 230 lb 12.8 oz (104.7 kg)   SpO2 95%   BMI 43.57 kg/m   Physical Exam  General appearance - alert, well appearing, and in no distress Mental status - alert, oriented to person, place, and time, normal mood, behavior, speech, dress, motor activity, and thought processes Skin -soft, raise, slightly tender mass with fluctuance over the sternum between the breasts.  No erythema noted.  Some fluctuance.   ASSESSMENT AND PLAN: 1. Infected sebaceous cyst -Current episodes. - doxycycline (VIBRA-TABS) 100 MG tablet; Take 1 tablet (100 mg total) by mouth 2 (two) times daily.  Dispense: 14 tablet; Refill: 0 -Referral to general surgery to have it removed completely  2. Encounter for PPD test - PPD  3. Anxiety and depression - sertraline (ZOLOFT) 50 MG tablet; Take 1 tablet (50 mg total) by mouth daily.  Dispense: 30 tablet; Refill: 3  4. Essential hypertension - labetalol (NORMODYNE) 100 MG tablet; TAKE 1/2 TABLET (50 MG TOTAL) BY MOUTH 2 (TWO) TIMES DAILY.  Dispense: 30 tablet; Refill: 5  5. Primary osteoarthritis of both knees Refill given on tramadol and meloxicam.  Did not have time today to update with the new [facility] controlled substance prescribing agreement, will do on next visit in 6 wks. - traMADol (ULTRAM) 50 MG tablet; Take 1 tablet (50 mg total) by mouth every 8 (eight) hours as needed.  Dispense: 40 tablet; Refill: 0 - meloxicam (MOBIC) 15 MG tablet; Take 1 tablet (15 mg total) by mouth daily.  Dispense: 30 tablet; Refill: 6   Patient was given the opportunity to ask questions.  Patient verbalized understanding of the plan and was able to repeat key elements of the plan.   Orders Placed This Encounter  Procedures  . PPD     Requested Prescriptions   Signed Prescriptions Disp Refills  . sertraline (ZOLOFT) 50 MG tablet 30 tablet 3    Sig:  Take 1 tablet (50 mg total) by mouth daily.  Marland Kitchen labetalol (NORMODYNE) 100 MG tablet 30 tablet 5    Sig: TAKE 1/2 TABLET (50 MG TOTAL) BY MOUTH 2 (TWO) TIMES DAILY.  . traMADol (ULTRAM) 50 MG tablet 40 tablet 0    Sig: Take 1 tablet (50 mg total) by mouth every 8 (eight) hours as needed.  . meloxicam (MOBIC) 15 MG tablet 30 tablet 6    Sig: Take 1 tablet (15 mg  total) by mouth daily.  Marland Kitchen. doxycycline (VIBRA-TABS) 100 MG tablet 14 tablet 0    Sig: Take 1 tablet (100 mg total) by mouth 2 (two) times daily.    Return in about 6 weeks (around 11/01/2017) for OA knees.  Jonah Blueeborah Jaime Dome, MD, FACP

## 2017-09-20 NOTE — Progress Notes (Signed)
Pt has a knot on right breast

## 2017-09-20 NOTE — Patient Instructions (Addendum)
Please make a nurse visit in 48-72 hours to have ppd read  Tuberculin purified protein derivative, PPD injection What is this medicine? TUBERCULIN PURIFIED PROTEIN DERIVATIVE, PPD is a test used to detect if you have a tuberculosis infection. It will not cause tuberculosis infection. This medicine may be used for other purposes; ask your health care provider or pharmacist if you have questions. COMMON BRAND NAME(S): Aplisol, Tubersol What should I tell my health care provider before I take this medicine? They need to know if you have any of these conditions: -diabetes -HIV or AIDS -immune system problems -infection (especially a virus infection such as chickenpox, cold sores, or herpes) -kidney disease -malnutrition -an unusual or allergic reaction to tuberculin purified protein derivative, PPD, other medicines, foods, dyes, or preservatives -pregnant or trying to get pregnant -breast-feeding How should I use this medicine? This medicine is for injection in the skin. It is given by a health care professional in a hospital or clinic setting. Talk to your pediatrician regarding the use of this medicine in children. While this drug may be prescribed in children, precautions do apply. Overdosage: If you think you have taken too much of this medicine contact a poison control center or emergency room at once. NOTE: This medicine is only for you. Do not share this medicine with others. What if I miss a dose? It is important not to miss your dose. Call your doctor or health care professional if you are unable to keep an appointment. What may interact with this medicine? -adalimumab -anakinra -etanercept -infliximab -live virus vaccines -medicines to treat cancer -steroid medicines like prednisone or cortisone This list may not describe all possible interactions. Give your health care provider a list of all the medicines, herbs, non-prescription drugs, or dietary supplements you use. Also  tell them if you smoke, drink alcohol, or use illegal drugs. Some items may interact with your medicine. What should I watch for while using this medicine? See your health care provider as directed. This medicine does not protect against tuberculosis. What side effects may I notice from receiving this medicine? Side effects that you should report to your doctor or health care professional as soon as possible: -allergic reactions like skin rash, itching or hives, swelling of the face, lips, or tongue -breathing problems Side effects that usually do not require medical attention (report to your doctor or health care professional if they continue or are bothersome): -bruising -pain, redness, or irritation at site where injected This list may not describe all possible side effects. Call your doctor for medical advice about side effects. You may report side effects to FDA at 1-800-FDA-1088. Where should I keep my medicine? This drug is given in a hospital or clinic and will not be stored at home. NOTE: This sheet is a summary. It may not cover all possible information. If you have questions about this medicine, talk to your doctor, pharmacist, or health care provider.  2018 Elsevier/Gold Standard (2015-06-27 11:42:34)

## 2017-09-22 ENCOUNTER — Ambulatory Visit (HOSPITAL_COMMUNITY)
Admission: RE | Admit: 2017-09-22 | Discharge: 2017-09-22 | Disposition: A | Discharge status: Home / Self Care | Payer: Self-pay | Source: Ambulatory Visit | Attending: Internal Medicine | Admitting: Internal Medicine

## 2017-09-22 ENCOUNTER — Ambulatory Visit: Payer: Self-pay | Attending: Internal Medicine

## 2017-09-22 DIAGNOSIS — R7611 Nonspecific reaction to tuberculin skin test without active tuberculosis: Secondary | ICD-10-CM | POA: Insufficient documentation

## 2017-09-22 DIAGNOSIS — Z111 Encounter for screening for respiratory tuberculosis: Secondary | ICD-10-CM

## 2017-09-22 LAB — TB SKIN TEST: TB Skin Test: POSITIVE

## 2017-09-22 MED FILL — traMADol HCL 50 MG TABS: 50 | 13 days supply | Qty: 40 | Fill #2

## 2017-09-22 NOTE — Progress Notes (Signed)
Patient here today to have PPD site read.   PPD read and results entered in EpicCare. Result: 9x11 mm induration. Interpretation: Positive  Particia LatherJay'A R Pollock, RMA

## 2017-09-27 ENCOUNTER — Telehealth: Payer: Self-pay

## 2017-09-27 NOTE — Telephone Encounter (Signed)
Called patient again and she stated that she was not able to come all the way to [place] since she lives in [place]. However, I told her to give us a call once she decides on scheduling an appointment with us. Patient understood and had no further questions. Alexis BloomKelley will notify her PCP of patient's decision.

## 2017-09-30 ENCOUNTER — Telehealth: Payer: Self-pay | Admitting: Internal Medicine

## 2017-09-30 NOTE — Telephone Encounter (Signed)
Pt. Called requesting X-ray results for TB. I gave patient her results and told her to [facility]. Patient requested a copy of the results. Printed results and put them at the front desk.

## 2017-10-01 ENCOUNTER — Telehealth: Payer: Self-pay | Admitting: Internal Medicine

## 2017-10-01 ENCOUNTER — Telehealth: Payer: Self-pay

## 2017-10-01 NOTE — Telephone Encounter (Signed)
Called patient to let her know that her physician had referred her again to our clinic and that she stated that she can't drive to [place] since she leaved in [place]. Patient stated that we were far for her. I told patient to give us a call once she decided to have her cyst removed. Patient had no further questions.  I will send a staff message to her provider to let her know that the patient had declined to schedule an appointment twice.

## 2017-10-01 NOTE — Telephone Encounter (Signed)
-----   Message from Adela PortsMaritza [place], [place] sent at 10/01/2017  3:31 PM EDT ----- Regarding: Referral Hi Dr. Laural BenesJohnson. I wanted to let you know that I had contacted your patient to come to our office the first time and second time of your referral. However, when I called her the two times, patient declined the referral since we are located in [place]. She stated that it was too far for her to drive here. I had explained to her that since she had [facility] care, we were the only ones to see her. Please let me know if we could help. Thank you.

## 2017-10-26 ENCOUNTER — Other Ambulatory Visit: Payer: Self-pay | Admitting: Internal Medicine

## 2017-10-26 DIAGNOSIS — M170 Bilateral primary osteoarthritis of knee: Secondary | ICD-10-CM

## 2017-10-26 MED FILL — MELOXICAM 15 MG TABLET: 15 | 30 days supply | Qty: 30 | Fill #1

## 2017-10-26 MED FILL — SERTRALINE HCL 50 MG TABS: 50 | 30 days supply | Qty: 30 | Fill #1

## 2017-10-26 MED FILL — LABETALOL HCL 100 MG TABLET: 100 | 30 days supply | Qty: 30 | Fill #1

## 2017-10-29 NOTE — Telephone Encounter (Signed)
Contacted pt and made aware rx is ready for pick up 

## 2017-10-29 NOTE — Telephone Encounter (Signed)
Pt requesting RF on Tramadol.  Pt with OA knees.  Has f/u appt with me next wk.  I RF given.

## 2017-11-02 ENCOUNTER — Ambulatory Visit: Payer: Self-pay | Admitting: Internal Medicine

## 2017-11-05 ENCOUNTER — Ambulatory Visit: Payer: Self-pay | Admitting: Internal Medicine

## 2017-11-29 ENCOUNTER — Other Ambulatory Visit: Payer: Self-pay | Admitting: Internal Medicine

## 2017-11-29 DIAGNOSIS — M170 Bilateral primary osteoarthritis of knee: Secondary | ICD-10-CM

## 2017-11-29 MED FILL — SERTRALINE HCL 50 MG TABS: 50 | 30 days supply | Qty: 30 | Fill #2

## 2017-11-29 MED FILL — MELOXICAM 15 MG TABLET: 15 | 30 days supply | Qty: 30 | Fill #2

## 2017-11-29 NOTE — Telephone Encounter (Signed)
Patient called regarding her Tramadol Rx. Patient did not come and pick up her on 10/29/17. Patient would like to know if the Rx is still valid or if she needs a new Rx. Please f/u

## 2017-11-30 ENCOUNTER — Telehealth: Payer: Self-pay | Admitting: Internal Medicine

## 2017-11-30 DIAGNOSIS — F419 Anxiety disorder, unspecified: Principal | ICD-10-CM

## 2017-11-30 DIAGNOSIS — F329 Major depressive disorder, single episode, unspecified: Secondary | ICD-10-CM

## 2017-11-30 MED ORDER — SERTRALINE HCL 50 MG PO TABS: 50.0000 mg | ORAL_TABLET | Freq: Every day | ORAL | 3 refills | Status: DC

## 2017-11-30 MED FILL — traMADol HCL 50 MG TABS: 50 | 13 days supply | Qty: 40 | Fill #0

## 2017-11-30 NOTE — Telephone Encounter (Signed)
Patient called asking for traMADol (ULTRAM) 50 MG tablet T) and  sertraline (ZOLOFT) 50 MG tablet [[id number]][[id number]]  To be refilled.

## 2017-11-30 NOTE — Telephone Encounter (Signed)
Contacted pt and made aware  

## 2017-12-01 NOTE — Telephone Encounter (Signed)
She picked the tramadol up yesterday. I called her and she wants to know why you only wrote it for one month normally you write it for two.

## 2018-01-14 ENCOUNTER — Telehealth (HOSPITAL_COMMUNITY): Payer: Self-pay

## 2018-01-14 ENCOUNTER — Other Ambulatory Visit: Payer: Self-pay | Admitting: Obstetrics and Gynecology

## 2018-01-14 DIAGNOSIS — Z1231 Encounter for screening mammogram for malignant neoplasm of breast: Secondary | ICD-10-CM

## 2018-01-14 NOTE — Telephone Encounter (Signed)
Tried to reach I left a message to call [facility] °

## 2018-01-18 ENCOUNTER — Other Ambulatory Visit: Payer: Self-pay | Admitting: Internal Medicine

## 2018-01-18 DIAGNOSIS — M170 Bilateral primary osteoarthritis of knee: Secondary | ICD-10-CM

## 2018-01-18 MED FILL — MELOXICAM 15 MG TABLET: 15 | 30 days supply | Qty: 30 | Fill #3

## 2018-01-18 MED FILL — SERTRALINE HCL 50 MG TABS: 50 | 30 days supply | Qty: 30 | Fill #3

## 2018-01-19 NOTE — Telephone Encounter (Signed)
Could you please schedule pt an appointment  

## 2018-01-19 NOTE — Telephone Encounter (Signed)
Left a message for the patient to call back.  

## 2018-02-03 ENCOUNTER — Ambulatory Visit: Payer: Self-pay | Attending: Internal Medicine | Admitting: Internal Medicine

## 2018-02-03 ENCOUNTER — Encounter: Payer: Self-pay | Admitting: Internal Medicine

## 2018-02-03 VITALS — BP 106/69 | HR 75 | Temp 98.5°F | Resp 16 | Wt 226.6 lb

## 2018-02-03 DIAGNOSIS — E785 Hyperlipidemia, unspecified: Secondary | ICD-10-CM | POA: Insufficient documentation

## 2018-02-03 DIAGNOSIS — Z8249 Family history of ischemic heart disease and other diseases of the circulatory system: Secondary | ICD-10-CM | POA: Insufficient documentation

## 2018-02-03 DIAGNOSIS — F32A Depression, unspecified: Secondary | ICD-10-CM

## 2018-02-03 DIAGNOSIS — Z803 Family history of malignant neoplasm of breast: Secondary | ICD-10-CM | POA: Insufficient documentation

## 2018-02-03 DIAGNOSIS — L089 Local infection of the skin and subcutaneous tissue, unspecified: Secondary | ICD-10-CM

## 2018-02-03 DIAGNOSIS — Z09 Encounter for follow-up examination after completed treatment for conditions other than malignant neoplasm: Secondary | ICD-10-CM | POA: Insufficient documentation

## 2018-02-03 DIAGNOSIS — F329 Major depressive disorder, single episode, unspecified: Secondary | ICD-10-CM | POA: Insufficient documentation

## 2018-02-03 DIAGNOSIS — Z87891 Personal history of nicotine dependence: Secondary | ICD-10-CM | POA: Insufficient documentation

## 2018-02-03 DIAGNOSIS — Z880 Allergy status to penicillin: Secondary | ICD-10-CM | POA: Insufficient documentation

## 2018-02-03 DIAGNOSIS — F419 Anxiety disorder, unspecified: Secondary | ICD-10-CM | POA: Insufficient documentation

## 2018-02-03 DIAGNOSIS — L723 Sebaceous cyst: Secondary | ICD-10-CM | POA: Insufficient documentation

## 2018-02-03 DIAGNOSIS — Z6841 Body Mass Index (BMI) 40.0 and over, adult: Secondary | ICD-10-CM | POA: Insufficient documentation

## 2018-02-03 DIAGNOSIS — Z9889 Other specified postprocedural states: Secondary | ICD-10-CM | POA: Insufficient documentation

## 2018-02-03 DIAGNOSIS — M170 Bilateral primary osteoarthritis of knee: Secondary | ICD-10-CM

## 2018-02-03 DIAGNOSIS — Z882 Allergy status to sulfonamides status: Secondary | ICD-10-CM | POA: Insufficient documentation

## 2018-02-03 DIAGNOSIS — Z79899 Other long term (current) drug therapy: Secondary | ICD-10-CM | POA: Insufficient documentation

## 2018-02-03 DIAGNOSIS — R413 Other amnesia: Secondary | ICD-10-CM

## 2018-02-03 DIAGNOSIS — Z800 Family history of malignant neoplasm of digestive organs: Secondary | ICD-10-CM | POA: Insufficient documentation

## 2018-02-03 DIAGNOSIS — Z791 Long term (current) use of non-steroidal anti-inflammatories (NSAID): Secondary | ICD-10-CM | POA: Insufficient documentation

## 2018-02-03 DIAGNOSIS — I10 Essential (primary) hypertension: Secondary | ICD-10-CM

## 2018-02-03 MED ORDER — MELOXICAM 15 MG PO TABS: 15.0000 mg | ORAL_TABLET | Freq: Every day | ORAL | 6 refills | Status: DC

## 2018-02-03 MED ORDER — CLINDAMYCIN HCL 300 MG PO CAPS: 300.0000 mg | ORAL_CAPSULE | Freq: Three times a day (TID) | ORAL | 0 refills | Status: DC

## 2018-02-03 MED ORDER — SERTRALINE HCL 100 MG PO TABS: 100.0000 mg | ORAL_TABLET | Freq: Every day | ORAL | 6 refills | Status: DC

## 2018-02-03 MED ORDER — LISINOPRIL-HYDROCHLOROTHIAZIDE 20-25 MG PO TABS
1.0000 | ORAL_TABLET | Freq: Every day | ORAL | 3 refills | Status: DC
Start: 2018-02-03 — End: 2018-11-29

## 2018-02-03 MED ORDER — TRAMADOL HCL 50 MG PO TABS: 50.0000 mg | ORAL_TABLET | Freq: Two times a day (BID) | ORAL | 2 refills | Status: DC | PRN

## 2018-02-03 MED FILL — traMADol HCL 50 MG TABS: 50 | 30 days supply | Qty: 60 | Fill #0

## 2018-02-03 MED FILL — CLINDAMYCIN HCL 300 MG CAP: 300 | 7 days supply | Qty: 21 | Fill #0

## 2018-02-03 MED FILL — LISINOPRIL-HCTZ 20-25 MG TA: 20-25 | 30 days supply | Qty: 30 | Fill #0

## 2018-02-03 NOTE — Patient Instructions (Signed)
Increase Zoloft to 100 mg daily. You have been referred to a general surgeon for the infected cyst.

## 2018-02-03 NOTE — Progress Notes (Signed)
Patient ID: Alexis Mooney, female    DOB: 11/25/1965  MRN: [id number]  CC: Follow-up   Subjective: Alexis Mooney is a 52 y.o. female who presents for chronic ds Her concerns today include:  History of HTN,morbidobesity, osteoarthritis of the knees,hyperlipidemia(TG)  HTN:  Seen at UC 1 mth ago.  BP found to be elevated.  Labetalol changed to Lis/HCTZ 20/25 mg daily.  Tolerating it okay. Limits salt in foods. No CP/SOB/LE edema  C/o increase forgetfulness.  She gave example of forgeting why she goes into the kitchen and sometimes she is driving and forgets where she was going -under a lot of stress as her husband unemployed and all the bills fall to her.  She has been working a lot of overtime to make ends meet.  She is tired a lot because of this.  To add to her stress, she is working on trying to get her immigration status straighten out.  Feels nervous and depressed.  She is on Zoloft 50 mg daily which she takes consistently. -completed high school No fhx of dementia  Sebaceous cyst b/w breast:  Referred to surgeon on last visit.  Did not go because the office was too far away -went to a walkin clinic 2 mths ago where it was I&D.  Given abx x 2 wks.  Went back for f/u 1 wks later because it did not completely resolve.  Told to f/u upon completion of abx if still not resolve. -cyst still comes and goes.   -started draining last night  OA knees: Requesting refill on meloxicam and tramadol.  She is on her legs a lot at work.  She takes the tramadol only when needed.  She denies any significant side effects from the tramadol.  Patient Active Problem List   Diagnosis Date Noted  . Anxiety and depression 03/19/2017  . Hair loss 03/19/2017  . High triglycerides 12/29/2016  . Medication monitoring encounter 12/29/2016  . Morbid obesity with BMI of 40.0-44.9, adult (HCC) 10/19/2016  . Perimenopause 01/22/2014  . Essential hypertension 01/22/2014     Current  Outpatient Medications on File Prior to Visit  Medication Sig Dispense Refill  . doxycycline (VIBRA-TABS) 100 MG tablet Take 1 tablet (100 mg total) by mouth 2 (two) times daily. (Patient not taking: Reported on 02/03/2018) 14 tablet 0  . hydrocortisone 2.5 % cream Apply topically daily. For 4 days 30 g 0  . labetalol (NORMODYNE) 100 MG tablet TAKE 1/2 TABLET (50 MG TOTAL) BY MOUTH 2 (TWO) TIMES DAILY. 30 tablet 5  . loratadine (CLARITIN) 10 MG tablet Take 1 tablet (10 mg total) by mouth daily. 30 tablet 11  . meloxicam (MOBIC) 15 MG tablet Take 1 tablet (15 mg total) by mouth daily. 30 tablet 6  . mupirocin ointment (BACTROBAN) 2 % Place 1 application into the nose 2 (two) times daily. 22 g 0  . omeprazole (PRILOSEC) 20 MG capsule Take 1 capsule (20 mg total) by mouth daily. 30 capsule 3  . sertraline (ZOLOFT) 50 MG tablet Take 1 tablet (50 mg total) by mouth daily. 30 tablet 3  . terbinafine (LAMISIL) 250 MG tablet Take 1 tablet (250 mg total) by mouth daily. 30 tablet 2  . traMADol (ULTRAM) 50 MG tablet TAKE 1 TABLET BY MOUTH EVERY 8 HOURS AS NEEDED 40 tablet 0   No current facility-administered medications on file prior to visit.     Allergies  Allergen Reactions  . Ampicillin Hives  . Bactrim [Sulfamethoxazole-Trimethoprim] Swelling  Social History   Socioeconomic History  . Marital status: Single    Spouse name: Not on file  . Number of children: Not on file  . Years of education: Not on file  . Highest education level: Not on file  Occupational History  . Not on file  Social Needs  . Financial resource strain: Not on file  . Food insecurity:    Worry: Not on file    Inability: Not on file  . Transportation needs:    Medical: Not on file    Non-medical: Not on file  Tobacco Use  . Smoking status: Former Smoker    Last attempt to quit: 09/24/1994    Years since quitting: 23.3  . Smokeless tobacco: Never Used  Substance and Sexual Activity  . Alcohol use: Yes     Comment: rarely  . Drug use: No  . Sexual activity: Yes    Birth control/protection: None  Lifestyle  . Physical activity:    Days per week: Not on file    Minutes per session: Not on file  . Stress: Not on file  Relationships  . Social connections:    Talks on phone: Not on file    Gets together: Not on file    Attends religious service: Not on file    Active member of club or organization: Not on file    Attends meetings of clubs or organizations: Not on file    Relationship status: Not on file  . Intimate partner violence:    Fear of current or ex partner: Not on file    Emotionally abused: Not on file    Physically abused: Not on file    Forced sexual activity: Not on file  Other Topics Concern  . Not on file  Social History Narrative  . Not on file    Family History  Problem Relation Age of Onset  . Cancer Sister 40       stomach  . Hypertension Mother   . Hypertension Father   . Breast cancer Maternal Aunt   . Breast cancer Cousin     Past Surgical History:  Procedure Laterality Date  . ABSCESS DRAINAGE    . BREAST BIOPSY Left    [place] Core bx, 2010?, pt not sure when    ROS: Review of Systems Negative except as stated above.  PHYSICAL EXAM: BP 106/69   Pulse 75   Temp 98.5 F (36.9 C) (Oral)   Resp 16   Wt 226 lb 9.6 oz (102.8 kg)   SpO2 98%   BMI 42.78 kg/m   Physical Exam  General appearance - alert, well appearing, and in no distress Mental status - normal mood, behavior, speech, dress, motor activity, and thought processes Neck - supple, no significant adenopathy Chest - clear to auscultation, no wheezes, rales or rhonchi, symmetric air entry Heart - normal rate, regular rhythm, normal S1, S2, no murmurs, rubs, clicks or gallops Musculoskeletal -mild enlargement of the knee joints.  Mild crepitus and discomfort on passive range of motion. Extremities -no lower extremity edema. Skin: 3 cm raised slightly erythematous area between the breasts.   Slight cottage cheese material expressed. MiniCog exam 5/5 (100% on 5-minute recall and clock drawing)   ASSESSMENT AND PLAN: 1. Essential hypertension At goal. - lisinopril-hydrochlorothiazide (PRINZIDE,ZESTORETIC) 20-25 MG tablet; Take 1 tablet by mouth daily.  Dispense: 90 tablet; Refill: 3  2. Anxiety and depression 3. Memory disorder -I think her memory issues are likely due to depression  anxiety which are made worse by current life stressors.  I recommend increasing the Zoloft to 100 mg daily.  She declines meeting with our LCSW today or referral to behavioral health.  She denies any suicidal ideation at this time. - sertraline (ZOLOFT) 100 MG tablet; Take 1 tablet (100 mg total) by mouth daily.  Dispense: 30 tablet; Refill: 6  4. Primary osteoarthritis of both knees Weight loss encouraged. She will apply for the orange card so that we can refer to orthopedics. Refill given on meloxicam and tramadol. Controlled substance prescribing agreement reviewed with patient and she signed it. NCC[place] reviewed - meloxicam (MOBIC) 15 MG tablet; Take 1 tablet (15 mg total) by mouth daily.  Dispense: 30 tablet; Refill: 6 - traMADol (ULTRAM) 50 MG tablet; Take 1 tablet (50 mg total) by mouth every 12 (twelve) hours as needed.  Dispense: 60 tablet; Refill: 2  5. Infected sebaceous cyst - clindamycin (CLEOCIN) 300 MG capsule; Take 1 capsule (300 mg total) by mouth 3 (three) times daily.  Dispense: 21 capsule; Refill: 0 - Ambulatory referral to General Surgery  Patient was given the opportunity to ask questions.  Patient verbalized understanding of the plan and was able to repeat key elements of the plan.   No orders of the defined types were placed in this encounter.    Requested Prescriptions    No prescriptions requested or ordered in this encounter    No follow-ups on file.  Jonah Blue, MD, FACP

## 2018-02-04 DIAGNOSIS — L089 Local infection of the skin and subcutaneous tissue, unspecified: Secondary | ICD-10-CM | POA: Insufficient documentation

## 2018-02-04 DIAGNOSIS — L723 Sebaceous cyst: Secondary | ICD-10-CM

## 2018-02-04 DIAGNOSIS — R413 Other amnesia: Secondary | ICD-10-CM | POA: Insufficient documentation

## 2018-03-08 ENCOUNTER — Ambulatory Visit
Admission: RE | Admit: 2018-03-08 | Discharge: 2018-03-08 | Disposition: A | Discharge status: Home / Self Care | Payer: No Typology Code available for payment source | Source: Ambulatory Visit | Attending: Obstetrics and Gynecology | Admitting: Obstetrics and Gynecology

## 2018-03-08 ENCOUNTER — Encounter (HOSPITAL_COMMUNITY): Payer: Self-pay

## 2018-03-08 ENCOUNTER — Ambulatory Visit (HOSPITAL_COMMUNITY)
Admission: RE | Admit: 2018-03-08 | Discharge: 2018-03-08 | Disposition: A | Discharge status: Home / Self Care | Payer: Self-pay | Source: Ambulatory Visit | Attending: Obstetrics and Gynecology | Admitting: Obstetrics and Gynecology

## 2018-03-08 ENCOUNTER — Other Ambulatory Visit: Payer: Self-pay | Admitting: Obstetrics and Gynecology

## 2018-03-08 ENCOUNTER — Ambulatory Visit: Payer: Self-pay

## 2018-03-08 VITALS — BP 118/74

## 2018-03-08 DIAGNOSIS — N611 Abscess of the breast and nipple: Secondary | ICD-10-CM

## 2018-03-08 DIAGNOSIS — N6312 Unspecified lump in the right breast, upper inner quadrant: Secondary | ICD-10-CM

## 2018-03-08 DIAGNOSIS — Z01419 Encounter for gynecological examination (general) (routine) without abnormal findings: Secondary | ICD-10-CM

## 2018-03-08 MED FILL — LISINOPRIL-HCTZ 20-25 MG TA: 20-25 | 30 days supply | Qty: 30 | Fill #1

## 2018-03-08 MED FILL — traMADol HCL 50 MG TABS: 50 | 30 days supply | Qty: 60 | Fill #1

## 2018-03-08 MED FILL — MELOXICAM 15 MG TABLET: 15 | 30 days supply | Qty: 30 | Fill #4

## 2018-03-08 NOTE — Addendum Note (Signed)
Encounter addended by: Lynnell Dike, LPN on: 54/0/9811 12:30 PM  Actions taken: Order list changed

## 2018-03-08 NOTE — Patient Instructions (Signed)
Explained breast self awareness with [name]. Let patient know [facility] will cover Pap smears and HPV typing every 5 years unless has a history of abnormal Pap smears. Referred patient to the [facility] for a diagnostic mammogram and right breast ultrasound. Appointment scheduled for Tuesday, March 08, 2018 at 1300. Patient aware of appointment and will be there. Let patient know will follow up with her within the next week with results of Pap smear and wet prep by phone. Alexis Mooney verbalized understanding.  [place]annock, Kathaleen Maser, RN 12:13 PM

## 2018-03-08 NOTE — Progress Notes (Signed)
Complained of healing left breast abscess x 4-5 months. Patient states she has been treated with antibiotics 2-3 times with relief then it comes back. Patient stated she has had it drained and pus comes out.  Pap Smear: Pap smear completed today. Last Pap smear was 10/01/2014 at [facility] and normal. Per patient has no history of an abnormal Pap smear. Last Pap smear result is in Epic.  Physical exam: Breasts Breasts symmetrical. No skin abnormalities left breast. Observed a healing breast abscess right breast 2 o'clock 14 cm from the nipple. No nipple retraction bilateral breasts. No nipple discharge bilateral breasts. No lymphadenopathy. No lumps palpated left breast. Palpated a lump within the right breast at 2 o'clock 14 cm from the nipple. No complaints of pain or tenderness on exam. Referred patient to the [facility] for a diagnostic mammogram and right breast ultrasound. Appointment scheduled for Tuesday, March 08, 2018 at 1300.        Pelvic/Bimanual   Ext Genitalia No lesions, no swelling and no discharge observed on external genitalia.         Vagina Vagina pink and normal texture. No lesions and thick white yeast appearing discharge observed in vagina. Wet prep completed.       Cervix Cervix is present. Cervix pink and of normal texture. Thick white yeast appearing discharge observed on cervix.    Uterus Uterus is present and palpable. Uterus in normal position and normal size.        Adnexae Bilateral ovaries present and palpable. No tenderness on palpation.         Rectovaginal No rectal exam completed today since patient had no rectal complaints. No skin abnormalities observed on exam.    Smoking History: Patient has never smoked.  Patient Navigation: Patient education provided. Access to services provided for patient through [facility] program. Spanish interpreter provided.   Colorectal Cancer Screening: Per patient has never  had a colonoscopy completed. FIT Test completed 10/30/2016 that was negative. No complaints today. FIT Test given to patient to complete and return to [facility].  Breast and Cervical Cancer Risk Assessment: Patient has a family history of three paternal aunts having breast cancer. Patient has no known genetic mutations or history of radiation treatment to the chest before age 20. Patient has no history of cervical dysplasia, immunocompromised, or DES exposure in-utero.  Risk Assessment    Risk Scores      03/08/2018   Last edited by: Lynnell Dike, LPN   5-year risk: 0.9 %   Lifetime risk: 7.4 %         Used Spanish interpreter Natale Lay from [employer].

## 2018-03-10 LAB — CYTOLOGY - PAP
Adequacy: ABSENT
Bacterial vaginitis: NEGATIVE
Candida vaginitis: NEGATIVE
Diagnosis: NEGATIVE
HPV: NOT DETECTED
Trichomonas: NEGATIVE

## 2018-03-14 ENCOUNTER — Other Ambulatory Visit: Payer: Self-pay

## 2018-03-15 LAB — FECAL OCCULT BLOOD, IMMUNOCHEMICAL: Fecal Occult Bld: NEGATIVE

## 2018-04-04 ENCOUNTER — Encounter (HOSPITAL_COMMUNITY): Payer: Self-pay | Admitting: *Deleted

## 2018-04-04 NOTE — Progress Notes (Signed)
Letter mailed to patient with negative Fit Test results.  

## 2018-04-05 ENCOUNTER — Ambulatory Visit: Payer: Self-pay | Admitting: Internal Medicine

## 2018-04-22 ENCOUNTER — Telehealth (HOSPITAL_COMMUNITY): Payer: Self-pay | Admitting: *Deleted

## 2018-04-22 NOTE — Telephone Encounter (Signed)
Patient called Spanish interpreter [name] due to the [facility] due to recurrent sebaceous cyst and no appointment had been scheduled.   [facility] and made follow-up appointment for patient on Monday, May 02, 2018 at 1330.   Called patient back with Spanish interpreter Alexis ConnJulia Mooney and gave appointment time, date, and location. Explained she will need to bring her pink [facility]CP card to her appointment. Patient verbalized understanding.

## 2018-04-27 MED FILL — LISINOPRIL-HCTZ 20-25 MG TA: 20-25 | 30 days supply | Qty: 30 | Fill #2

## 2018-04-27 MED FILL — MELOXICAM 15 MG TABLET: 15 | 30 days supply | Qty: 30 | Fill #5

## 2018-04-27 MED FILL — SERTRALINE HCL 50 MG TABS: 50 | 30 days supply | Qty: 30 | Fill #0

## 2018-04-27 MED FILL — traMADol HCL 50 MG TABS: 50 | 30 days supply | Qty: 60 | Fill #2

## 2018-06-10 ENCOUNTER — Other Ambulatory Visit: Payer: Self-pay | Admitting: Internal Medicine

## 2018-06-10 DIAGNOSIS — M170 Bilateral primary osteoarthritis of knee: Secondary | ICD-10-CM

## 2018-06-10 MED FILL — MELOXICAM 15 MG TABLET: 15 | 30 days supply | Qty: 30 | Fill #6

## 2018-06-10 MED FILL — LISINOPRIL-HCTZ 20-25 MG TA: 20-25 | 30 days supply | Qty: 30 | Fill #3

## 2018-06-11 NOTE — Telephone Encounter (Signed)
Pt last seen: 02/03/18 Next appt: n/a Last RX written on: 02/03/18 Date of original fill: 02/03/18 Date of refill(s): 03/08/18, 04/27/18   No other controlled substances were filled during this time, please refill if appropriate.

## 2018-06-13 NOTE — Telephone Encounter (Signed)
Schedule an appointment.

## 2018-06-13 NOTE — Telephone Encounter (Signed)
Called patient and notified of appointment needing to be schedule. Patient said she would call back to schedule an appointment.

## 2018-06-29 MED FILL — SERTRALINE HCL 50 MG TABS: 50 | 30 days supply | Qty: 30 | Fill #1

## 2018-07-05 ENCOUNTER — Other Ambulatory Visit: Payer: Self-pay

## 2018-07-05 ENCOUNTER — Ambulatory Visit: Payer: Self-pay | Attending: Internal Medicine | Admitting: Internal Medicine

## 2018-07-05 ENCOUNTER — Encounter (HOSPITAL_COMMUNITY): Payer: Self-pay | Admitting: *Deleted

## 2018-07-05 ENCOUNTER — Encounter: Payer: Self-pay | Admitting: Internal Medicine

## 2018-07-05 VITALS — BP 117/86 | HR 93 | Temp 98.9°F | Resp 16 | Wt 234.8 lb

## 2018-07-05 DIAGNOSIS — M170 Bilateral primary osteoarthritis of knee: Secondary | ICD-10-CM | POA: Insufficient documentation

## 2018-07-05 DIAGNOSIS — F419 Anxiety disorder, unspecified: Secondary | ICD-10-CM | POA: Insufficient documentation

## 2018-07-05 DIAGNOSIS — F329 Major depressive disorder, single episode, unspecified: Secondary | ICD-10-CM | POA: Insufficient documentation

## 2018-07-05 DIAGNOSIS — R079 Chest pain, unspecified: Secondary | ICD-10-CM | POA: Insufficient documentation

## 2018-07-05 DIAGNOSIS — Z791 Long term (current) use of non-steroidal anti-inflammatories (NSAID): Secondary | ICD-10-CM | POA: Insufficient documentation

## 2018-07-05 DIAGNOSIS — I10 Essential (primary) hypertension: Secondary | ICD-10-CM | POA: Insufficient documentation

## 2018-07-05 DIAGNOSIS — Z8249 Family history of ischemic heart disease and other diseases of the circulatory system: Secondary | ICD-10-CM | POA: Insufficient documentation

## 2018-07-05 DIAGNOSIS — Z803 Family history of malignant neoplasm of breast: Secondary | ICD-10-CM | POA: Insufficient documentation

## 2018-07-05 DIAGNOSIS — Z881 Allergy status to other antibiotic agents status: Secondary | ICD-10-CM | POA: Insufficient documentation

## 2018-07-05 DIAGNOSIS — Z79899 Other long term (current) drug therapy: Secondary | ICD-10-CM | POA: Insufficient documentation

## 2018-07-05 DIAGNOSIS — Z880 Allergy status to penicillin: Secondary | ICD-10-CM | POA: Insufficient documentation

## 2018-07-05 DIAGNOSIS — Z87891 Personal history of nicotine dependence: Secondary | ICD-10-CM | POA: Insufficient documentation

## 2018-07-05 DIAGNOSIS — Z800 Family history of malignant neoplasm of digestive organs: Secondary | ICD-10-CM | POA: Insufficient documentation

## 2018-07-05 DIAGNOSIS — Z6841 Body Mass Index (BMI) 40.0 and over, adult: Secondary | ICD-10-CM | POA: Insufficient documentation

## 2018-07-05 MED ORDER — TRAMADOL HCL 50 MG PO TABS: 50.0000 mg | ORAL_TABLET | Freq: Two times a day (BID) | ORAL | 0 refills | Status: DC | PRN

## 2018-07-05 NOTE — Progress Notes (Signed)
Letter mailed to patient with negative pap smear results. HPV was negative. Next pap smear due in five years. 

## 2018-07-05 NOTE — Progress Notes (Signed)
Patient ID: Alexis Mooney, female    DOB: 08/04/1965  MRN: [id number]  CC:  Chest pain, HTN, arthritis  Subjective: Alexis Mooney is a 53 y.o. female who presents for chronic disease management  Her concerns today include:  History of HTN,morbidobesity, osteoarthritis of the knees,hyperlipidemia(TG)  Patient was seen at the dentist office today and [facility].  She reports that after a long wait she became agitated.  When she was finally called, her blood pressure was noted to be elevated and she complained of chest pain.  Patient was advised to be seen in the emergency room but she declined.  She told them that she was coming to see her doctor today.  Chest pain was in the left upper chest and felt like it was radiating to the left arm.  No associated shortness of breath.  No numbness or tingling.  Denies any similar chest pains recently -She reports that the chest pain is just about gone.  She  feels a little discomfort when she presses on the left upper chest.  HTN: She reports compliance with lisinopril HCTZ.  She has noted a dry cough intermittently since being on this medication.  She limits salt in the foods.  OA: Requesting refill on meloxicam and tramadol.  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  Pain is worse with prolonged standing and walking.  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  No numbness or tingling in the hands. -uses Tramadol PRN with good results.  Did not come back for f/u visit in several mths because she owed $80  Obesity: She feels she does okay with her eating habits.  She avoids sweet drinks.  She eats a lot of rice. Patient Active Problem List   Diagnosis Date Noted  . Infected sebaceous cyst 02/04/2018  . Memory disorder 02/04/2018  . Anxiety and depression 03/19/2017  . Hair loss 03/19/2017  . High triglycerides 12/29/2016  .  Medication monitoring encounter 12/29/2016  . Morbid obesity with BMI of 40.0-44.9, adult (HCC) 10/19/2016  . Perimenopause 01/22/2014  . Essential hypertension 01/22/2014     Current Outpatient Medications on File Prior to Visit  Medication Sig Dispense Refill  . clindamycin (CLEOCIN) 300 MG capsule Take 1 capsule (300 mg total) by mouth 3 (three) times daily. (Patient not taking: Reported on 03/08/2018) 21 capsule 0  . hydrocortisone 2.5 % cream Apply topically daily. For 4 days (Patient not taking: Reported on 03/08/2018) 30 g 0  . lisinopril-hydrochlorothiazide (PRINZIDE,ZESTORETIC) 20-25 MG tablet Take 1 tablet by mouth daily. 90 tablet 3  . loratadine (CLARITIN) 10 MG tablet Take 1 tablet (10 mg total) by mouth daily. 30 tablet 11  . meloxicam (MOBIC) 15 MG tablet Take 1 tablet (15 mg total) by mouth daily. 30 tablet 6  . mupirocin ointment (BACTROBAN) 2 % Place 1 application into the nose 2 (two) times daily. (Patient not taking: Reported on 03/08/2018) 22 g 0  . omeprazole (PRILOSEC) 20 MG capsule Take 1 capsule (20 mg total) by mouth daily. (Patient not taking: Reported on 03/08/2018) 30 capsule 3  . sertraline (ZOLOFT) 100 MG tablet Take 1 tablet (100 mg total) by mouth daily. 30 tablet 6  . traMADol (ULTRAM) 50 MG tablet Take 1 tablet (50 mg total) by mouth every 12 (twelve) hours as needed. 60 tablet 2   No current  facility-administered medications on file prior to visit.     Allergies  Allergen Reactions  . Ampicillin Hives  . Bactrim [Sulfamethoxazole-Trimethoprim] Swelling    Social History   Socioeconomic History  . Marital status: Single    Spouse name: Not on file  . Number of children: Not on file  . Years of education: Not on file  . Highest education level: Not on file  Occupational History  . Not on file  Social Needs  . Financial resource strain: Not on file  . Food insecurity:    Worry: Not on file    Inability: Not on file  . Transportation needs:     Medical: Not on file    Non-medical: Not on file  Tobacco Use  . Smoking status: Former Smoker    Last attempt to quit: 09/24/1994    Years since quitting: 23.7  . Smokeless tobacco: Never Used  Substance and Sexual Activity  . Alcohol use: Yes    Comment: rarely  . Drug use: No  . Sexual activity: Yes    Birth control/protection: None  Lifestyle  . Physical activity:    Days per week: Not on file    Minutes per session: Not on file  . Stress: Not on file  Relationships  . Social connections:    Talks on phone: Not on file    Gets together: Not on file    Attends religious service: Not on file    Active member of club or organization: Not on file    Attends meetings of clubs or organizations: Not on file    Relationship status: Not on file  . Intimate partner violence:    Fear of current or ex partner: Not on file    Emotionally abused: Not on file    Physically abused: Not on file    Forced sexual activity: Not on file  Other Topics Concern  . Not on file  Social History Narrative  . Not on file    Family History  Problem Relation Age of Onset  . Cancer Sister 40       stomach  . Hypertension Mother   . Hypertension Father   . Breast cancer Maternal Aunt   . Breast cancer Cousin     Past Surgical History:  Procedure Laterality Date  . ABSCESS DRAINAGE    . BREAST BIOPSY Left    [place] Core bx, 2010?, pt not sure when    ROS: Negative except as above PHYSICAL EXAM: BP 117/86   Pulse 93   Temp 98.9 F (37.2 C) (Oral)   Resp 16   Wt 234 lb 12.8 oz (106.5 kg)   SpO2 97%   BMI 44.33 kg/m   Repeat BP 110/70 General appearance - well nourished obesed female in NAD Mental status - normal mood, behavior, speech, dress, motor activity, and thought processes Neck - supple, no cervical lymphadenopathy Chest -back to auscultation bilaterally.   Heart -regular rate rhythm.  No gallops or murmurs.  Mild reproducible tenderness in the left upper chest.     Extremities - peripheral pulses normal, no pedal edema, no clubbing or cyanosis  EKG was essentially normal.  No old EKG to compare.  ASSESSMENT AND PLAN: 1. Chest pain, unspecified type Pt presenting with CP after becoming emotional upset in dentist office.  BP elevated at the time.  BP here nl, pain with reproducible component and EKG nl.  Will not pursue further work up at this time.  Pt advise  to return here or be seen in ER if she has any further episodes.  2. Essential hypertension At goal.  We discussed changing Lisinopril/HCTZ  To Losartan and HCTZ given her c/o dry cough.  However, pt declines stating cough is not bothersome enough that she would want to change at this time  3. Primary osteoarthritis of both knees Wgh loss strongly encouraged.  Pt reports good relief and function on Tramadol and Mobic.  No significant S.E reported. Controlled Subst Rxn Agreement 01/2018 [place] reviewed.  Last Tramadol rxn written by me filled 04/2018. - traMADol (ULTRAM) 50 MG tablet; Take 1 tablet (50 mg total) by mouth every 12 (twelve) hours as needed.  Dispense: 60 tablet; Refill: 0 - traMADol (ULTRAM) 50 MG tablet; Take 1 tablet (50 mg total) by mouth every 12 (twelve) hours as needed. o fill on or after 08/04/2018  Dispense: 60 tablet; Refill: 0 - traMADol (ULTRAM) 50 MG tablet; Take 1 tablet (50 mg total) by mouth every 12 (twelve) hours as needed. Fill on or after 09/02/2018  Dispense: 60 tablet; Refill: 0  4. Morbid obesity (HCC) Discussed healthy eating habits.  Encouraged her to cut back on eating white carbs; told to stop sugar drinks.  Advise walking for 15 mins 3-4 x a wk and gradually increase to 30 mins.   Patient was given the opportunity to ask questions.  Patient verbalized understanding of the plan and was able to repeat key elements of the plan.   No orders of the defined types were placed in this encounter.    Requested Prescriptions    No prescriptions requested or  ordered in this encounter    No follow-ups on file.  Jonah Blueeborah Johnson, MD, FACP

## 2018-07-06 DIAGNOSIS — M170 Bilateral primary osteoarthritis of knee: Secondary | ICD-10-CM | POA: Insufficient documentation

## 2018-07-06 MED FILL — traMADol HCL 50 MG TABS: 50 | 30 days supply | Qty: 60 | Fill #0

## 2018-09-01 ENCOUNTER — Ambulatory Visit: Payer: No Typology Code available for payment source

## 2018-09-01 MED FILL — LISINOPRIL-HCTZ 20-25 MG TA: 20-25 | 30 days supply | Qty: 30 | Fill #4

## 2018-09-01 MED FILL — traMADol HCL 50 MG TABS: 50 | 30 days supply | Qty: 60 | Fill #0

## 2018-09-01 MED FILL — MELOXICAM 15 MG TABLET: 15 | 30 days supply | Qty: 30 | Fill #0

## 2018-09-15 ENCOUNTER — Ambulatory Visit: Payer: No Typology Code available for payment source

## 2018-11-24 MED FILL — MELOXICAM 15 MG TABLET: 15 | 30 days supply | Qty: 30 | Fill #1

## 2018-11-25 ENCOUNTER — Telehealth: Payer: Self-pay | Admitting: Internal Medicine

## 2018-11-25 MED FILL — traMADol HCL 50 MG TABS: 50 | 30 days supply | Qty: 60 | Fill #0

## 2018-11-25 NOTE — Telephone Encounter (Signed)
Patient states she feel coughing and shortness of breath when taking the lisinopril and would like to know if she can be prescribed something else.

## 2018-11-25 NOTE — Telephone Encounter (Signed)
Please call this pt she speaks spanish

## 2018-11-25 NOTE — Telephone Encounter (Signed)
Patients call taken.  Patient identified by name and date of birth.  Patient is complaining of reactions to lisinipril.  Patient complains of bloating and foul urine.  Patient endorses stomach pain and and diarrhea.  Patient has been ut of medication for a month symptoms have subsided.  Patient advised that information would be passed along to provider and return information would be forth coming.   Patient acknowledged understanding of advice.

## 2018-11-29 ENCOUNTER — Ambulatory Visit: Payer: Self-pay | Attending: Nurse Practitioner | Admitting: Nurse Practitioner

## 2018-11-29 ENCOUNTER — Encounter: Payer: Self-pay | Admitting: Nurse Practitioner

## 2018-11-29 ENCOUNTER — Other Ambulatory Visit: Payer: Self-pay

## 2018-11-29 DIAGNOSIS — Z0283 Encounter for blood-alcohol and blood-drug test: Secondary | ICD-10-CM

## 2018-11-29 DIAGNOSIS — I10 Essential (primary) hypertension: Secondary | ICD-10-CM

## 2018-11-29 DIAGNOSIS — N760 Acute vaginitis: Secondary | ICD-10-CM

## 2018-11-29 DIAGNOSIS — R7303 Prediabetes: Secondary | ICD-10-CM

## 2018-11-29 MED ORDER — AMLODIPINE BESYLATE 5 MG PO TABS: 5.0000 mg | ORAL_TABLET | Freq: Every day | ORAL | 1 refills | Status: DC

## 2018-11-29 MED FILL — ?AMLODIPINE BESYLATE 5MG TA: 5 | 30 days supply | Qty: 30 | Fill #0

## 2018-11-29 NOTE — Progress Notes (Signed)
Virtual Visit via Telephone Note Due to national recommendations of social distancing due to [place] 19, telehealth visit is felt to be most appropriate for this patient at this time.  I discussed the limitations, risks, security and privacy concerns of performing an evaluation and management service by telephone and the availability of in person appointments. I also discussed with the patient that there may be a patient responsible charge related to this service. The patient expressed understanding and agreed to proceed.    I connected with Alexis Mooney on 11/29/18  at   9:50 AM EDT  EDT by telephone and verified that I am speaking with the correct person using two identifiers.   Consent I discussed the limitations, risks, security and privacy concerns of performing an evaluation and management service by telephone and the availability of in person appointments. I also discussed with the patient that there may be a patient responsible charge related to this service. The patient expressed understanding and agreed to proceed.   Location of Patient: Private Residence   Location of Provider: [facility] [employer] [facility]    Persons participating in Telemedicine visit: Geryl Rankins FNP-BC [place]  ID# [id number] Spanish Interpreter   History of Present Illness: Telemedicine visit for: Medication Side effects and vaginitis. She endorses a history of right knee torn meniscus and is requesting tramadol. Will defer to PCP. UDS pending.   Endorse chronic dry cough when taking lisinopril-hctz. She stopped taking the medication and the cough went away. She restarted the medication and states the cough returned. She has not taken it for the past few months and now cough has subsided. She is requesting a new blood pressure medication. Will start on low dose amlodipine and have her return for BP recheck in a few weeks. Denies chest pain, shortness of breath,  palpitations, lightheadedness, dizziness, headaches or BLE edema.  BP Readings from Last 3 Encounters:  07/05/18 117/86  03/08/18 118/74  02/03/18 106/69     Vaginitis: Patient complains of an abnormal vaginal itching for several days. Vulvar symptoms include local irritation and vulvar itching.STI Risk: Very low risk of STD exposure. Discharge described as: normal and physiologicOther associated symptoms: none. She took OTC AZO for yeast but states no relief of symptoms.   Past Medical History:  Diagnosis Date  . Abscess   . Hypertension   . Miscarriage   . Pollen allergies     Past Surgical History:  Procedure Laterality Date  . ABSCESS DRAINAGE    . BREAST BIOPSY Left    [place] Core bx, 2010?, pt not sure when    Family History  Problem Relation Age of Onset  . Cancer Sister 40       stomach  . Hypertension Mother   . Hypertension Father   . Breast cancer Maternal Aunt   . Breast cancer Cousin     Social History   Socioeconomic History  . Marital status: Single    Spouse name: Not on file  . Number of children: Not on file  . Years of education: Not on file  . Highest education level: Not on file  Occupational History  . Not on file  Social Needs  . Financial resource strain: Not on file  . Food insecurity    Worry: Not on file    Inability: Not on file  . Transportation needs    Medical: Not on file    Non-medical: Not on file  Tobacco Use  . Smoking  status: Former Smoker    Quit date: 09/24/1994    Years since quitting: 24.1  . Smokeless tobacco: Never Used  Substance and Sexual Activity  . Alcohol use: Yes    Comment: rarely  . Drug use: No  . Sexual activity: Yes    Birth control/protection: None  Lifestyle  . Physical activity    Days per week: Not on file    Minutes per session: Not on file  . Stress: Not on file  Relationships  . Social Herbalist on phone: Not on file    Gets together: Not on file    Attends religious service: Not  on file    Active member of club or organization: Not on file    Attends meetings of clubs or organizations: Not on file    Relationship status: Not on file  Other Topics Concern  . Not on file  Social History Narrative  . Not on file     Observations/Objective: Awake, alert and oriented x 3   Review of Systems  Constitutional: Negative for fever, malaise/fatigue and weight loss.  HENT: Negative.  Negative for nosebleeds.   Eyes: Negative.  Negative for blurred vision, double vision and photophobia.  Respiratory: Negative.  Negative for cough and shortness of breath.   Cardiovascular: Negative.  Negative for chest pain, palpitations and leg swelling.  Gastrointestinal: Negative.  Negative for heartburn, nausea and vomiting.  Genitourinary:       SEE HPI  Musculoskeletal: Positive for joint pain (right knee). Negative for myalgias.  Neurological: Negative.  Negative for dizziness, focal weakness, seizures and headaches.  Psychiatric/Behavioral: Negative.  Negative for suicidal ideas.    Assessment and Plan: Shuntell was evaluated today for medication reaction.  Diagnoses and all orders for this visit:  Essential hypertension -     amLODipine (NORVASC) 5 MG tablet; Take 1 tablet (5 mg total) by mouth daily for 30 days. Continue all antihypertensives as prescribed.  Remember to bring in your blood pressure log with you for your follow up appointment.  DASH/Mediterranean Diets are healthier choices for HTN.    Acute vaginitis -     Cervicovaginal ancillary only; Future -     Urinalysis, Complete; Future  Prediabetes -     CBC; Future -     CMP14+EGFR; Future -     Lipid panel; Future -     Hemoglobin A1c; Future Lab Results  Component Value Date   HGBA1C 6.1 (H) 12/11/2015    Encounter for drug screening -     Drug Screen 12+Alcohol+CRT, Ur; Future     Follow Up Instructions Return for 2 weeks BP check; new med. Luke.     I discussed the assessment and treatment  plan with the patient. The patient was provided an opportunity to ask questions and all were answered. The patient agreed with the plan and demonstrated an understanding of the instructions.   The patient was advised to call back or seek an in-person evaluation if the symptoms worsen or if the condition fails to improve as anticipated.  I provided 24 minutes of non-face-to-face time during this encounter including median intraservice time, reviewing previous notes, labs, imaging, medications and explaining diagnosis and management.  Gildardo Pounds, FNP-BC

## 2018-11-30 ENCOUNTER — Ambulatory Visit: Payer: Self-pay | Attending: Internal Medicine

## 2018-11-30 ENCOUNTER — Other Ambulatory Visit: Payer: Self-pay

## 2018-11-30 DIAGNOSIS — N760 Acute vaginitis: Secondary | ICD-10-CM

## 2018-11-30 DIAGNOSIS — R7303 Prediabetes: Secondary | ICD-10-CM

## 2018-11-30 DIAGNOSIS — Z0283 Encounter for blood-alcohol and blood-drug test: Secondary | ICD-10-CM

## 2018-12-01 LAB — CMP14+EGFR
ALT: 40 IU/L — ABNORMAL HIGH (ref 0–32)
AST: 33 IU/L (ref 0–40)
Albumin/Globulin Ratio: 2.1 (ref 1.2–2.2)
Albumin: 4.6 g/dL (ref 3.8–4.9)
Alkaline Phosphatase: 82 IU/L (ref 39–117)
BUN/Creatinine Ratio: 21 (ref 9–23)
BUN: 11 mg/dL (ref 6–24)
Bilirubin Total: 0.5 mg/dL (ref 0.0–1.2)
CO2: 22 mmol/L (ref 20–29)
Calcium: 9.5 mg/dL (ref 8.7–10.2)
Chloride: 102 mmol/L (ref 96–106)
Creatinine, Ser: 0.53 mg/dL — ABNORMAL LOW (ref 0.57–1.00)
GFR calc Af Amer: 126 mL/min/{1.73_m2} (ref >59)
GFR calc non Af Amer: 110 mL/min/{1.73_m2} (ref >59)
Globulin, Total: 2.2 g/dL (ref 1.5–4.5)
Glucose: 210 mg/dL — ABNORMAL HIGH (ref 65–99)
Potassium: 4.3 mmol/L (ref 3.5–5.2)
Sodium: 139 mmol/L (ref 134–144)
Total Protein: 6.8 g/dL (ref 6.0–8.5)

## 2018-12-01 LAB — DRUG SCREEN 12+ALCOHOL+CRT, UR
Amphetamines, Urine: NEGATIVE ng/mL
BENZODIAZ UR QL: NEGATIVE ng/mL
Barbiturate: NEGATIVE ng/mL
Cannabinoids: NEGATIVE ng/mL
Cocaine (Metabolite): NEGATIVE ng/mL
Creatinine, Urine: 113.5 mg/dL (ref 20.0–300.0)
Ethanol, Urine: NEGATIVE %
Meperidine: NEGATIVE ng/mL
Methadone: NEGATIVE ng/mL
OPIATE SCREEN URINE: NEGATIVE ng/mL
Oxycodone/Oxymorphone, Urine: NEGATIVE ng/mL
Phencyclidine: NEGATIVE ng/mL
Propoxyphene: NEGATIVE ng/mL
Tramadol: NEGATIVE ng/mL

## 2018-12-01 LAB — CBC
Hematocrit: 42.6 % (ref 34.0–46.6)
Hemoglobin: 14.3 g/dL (ref 11.1–15.9)
MCH: 28.7 pg (ref 26.6–33.0)
MCHC: 33.6 g/dL (ref 31.5–35.7)
MCV: 86 fL (ref 79–97)
Platelets: 288 10*3/uL (ref 150–450)
RBC: 4.98 x10E6/uL (ref 3.77–5.28)
RDW: 13 % (ref 11.7–15.4)
WBC: 6 10*3/uL (ref 3.4–10.8)

## 2018-12-01 LAB — MICROSCOPIC EXAMINATION
Bacteria, UA: NONE SEEN
Casts: NONE SEEN /lpf

## 2018-12-01 LAB — URINALYSIS, COMPLETE
Bilirubin, UA: NEGATIVE
Glucose, UA: NEGATIVE
Ketones, UA: NEGATIVE
Leukocytes,UA: NEGATIVE
Nitrite, UA: NEGATIVE
Specific Gravity, UA: 1.023 (ref 1.005–1.030)
Urobilinogen, Ur: 0.2 mg/dL (ref 0.2–1.0)
pH, UA: 6.5 (ref 5.0–7.5)

## 2018-12-01 LAB — LIPID PANEL
Chol/HDL Ratio: 5.3 ratio — ABNORMAL HIGH (ref 0.0–4.4)
Cholesterol, Total: 231 mg/dL — ABNORMAL HIGH (ref 100–199)
HDL: 44 mg/dL (ref >39)
LDL Calculated: 133 mg/dL — ABNORMAL HIGH (ref 0–99)
Triglycerides: 270 mg/dL — ABNORMAL HIGH (ref 0–149)
VLDL Cholesterol Cal: 54 mg/dL — ABNORMAL HIGH (ref 5–40)

## 2018-12-01 LAB — HEMOGLOBIN A1C
Est. average glucose Bld gHb Est-mCnc: 209 mg/dL
Hgb A1c MFr Bld: 8.9 % — ABNORMAL HIGH (ref 4.8–5.6)

## 2018-12-06 ENCOUNTER — Other Ambulatory Visit: Payer: Self-pay | Admitting: Internal Medicine

## 2018-12-06 ENCOUNTER — Telehealth: Payer: Self-pay | Admitting: Internal Medicine

## 2018-12-06 DIAGNOSIS — M170 Bilateral primary osteoarthritis of knee: Secondary | ICD-10-CM

## 2018-12-06 NOTE — Telephone Encounter (Signed)
Pt would to know about lab results please follow up

## 2018-12-06 NOTE — Telephone Encounter (Signed)
Patients call returned.  Patient identified by name and date of birth.  Patient awaiting lab results due to continued illness.  Patient explained that PCP must sign off on results prior to results being released.   Patient acknowledged understanding of advice.

## 2018-12-06 NOTE — Telephone Encounter (Signed)
1) Medication(s) Requested (by name): meloxicam (MOBIC) 15 MG tablet [[id number]] traMADol (ULTRAM) 50 MG tablet [[id number]]    2) [facility] of Choice: [facility]  3) Special Requests:   Approved medications will be sent to the pharmacy, we will reach out if there is an issue.  Requests made after 3pm may not be addressed until the following business day!  If a patient is unsure of the name of the medication(s) please note and ask patient to call back when they are able to provide all info, do not send to responsible party until all information is available!

## 2018-12-07 ENCOUNTER — Ambulatory Visit: Payer: Self-pay | Attending: Internal Medicine

## 2018-12-07 ENCOUNTER — Other Ambulatory Visit: Payer: Self-pay

## 2018-12-08 ENCOUNTER — Other Ambulatory Visit: Payer: Self-pay | Admitting: Nurse Practitioner

## 2018-12-08 MED ORDER — METFORMIN HCL 500 MG PO TABS: ORAL_TABLET | ORAL | 3 refills | Status: DC

## 2018-12-08 MED ORDER — ATORVASTATIN CALCIUM 40 MG PO TABS: 40.0000 mg | ORAL_TABLET | Freq: Every day | ORAL | 3 refills | Status: DC

## 2018-12-08 MED ORDER — TRUE METRIX BLOOD GLUCOSE TEST VI STRP: ORAL_STRIP | 12 refills | Status: DC

## 2018-12-08 MED ORDER — TRUE METRIX METER W/DEVICE KIT: PACK | 0 refills | Status: DC

## 2018-12-08 MED ORDER — TRUEPLUS LANCETS 28G MISC: 3 refills | Status: DC

## 2018-12-08 MED ORDER — FLUCONAZOLE 150 MG PO TABS: 150.0000 mg | ORAL_TABLET | Freq: Once | ORAL | 1 refills | Status: AC

## 2018-12-08 NOTE — Telephone Encounter (Signed)
Patient called to get an update on their results. Please follow up.

## 2018-12-11 MED ORDER — MELOXICAM 15 MG PO TABS: 15.0000 mg | ORAL_TABLET | Freq: Every day | ORAL | 2 refills | Status: DC

## 2018-12-11 MED ORDER — TRAMADOL HCL 50 MG PO TABS: 50.0000 mg | ORAL_TABLET | Freq: Two times a day (BID) | ORAL | 0 refills | Status: DC | PRN

## 2018-12-12 ENCOUNTER — Other Ambulatory Visit: Payer: Self-pay | Admitting: Internal Medicine

## 2018-12-12 DIAGNOSIS — M170 Bilateral primary osteoarthritis of knee: Secondary | ICD-10-CM

## 2018-12-12 MED ORDER — TRAMADOL HCL 50 MG PO TABS: 50.0000 mg | ORAL_TABLET | Freq: Two times a day (BID) | ORAL | 0 refills | Status: DC | PRN

## 2018-12-12 MED FILL — TRUE METRIX TEST STRIP: 50 days supply | Qty: 100 | Fill #0

## 2018-12-12 MED FILL — TRUEplus LANCETS 28G MISC: 50 days supply | Qty: 100 | Fill #0

## 2018-12-12 MED FILL — !TRUE METRIX BLOOD GLUCOSE: 30 days supply | Qty: 1 | Fill #0

## 2018-12-13 NOTE — Telephone Encounter (Signed)
Nurse called the patient's home phone number but received no answer and message was left on the voicemail for the patient to call back.  Return phone number given. 

## 2018-12-15 ENCOUNTER — Telehealth: Payer: Self-pay | Admitting: Internal Medicine

## 2018-12-15 MED ORDER — ATORVASTATIN CALCIUM 40 MG PO TABS: 40.0000 mg | ORAL_TABLET | Freq: Every day | ORAL | 0 refills | Status: DC

## 2018-12-15 MED ORDER — METFORMIN HCL 500 MG PO TABS: ORAL_TABLET | ORAL | 0 refills | Status: DC

## 2018-12-15 MED FILL — ATORVASTATIN CALCIUM 40 MG: 40 | 30 days supply | Qty: 30 | Fill #0

## 2018-12-15 MED FILL — metFORMIN HCL 500 MG TABS: 500 | 22 days supply | Qty: 180 | Fill #0

## 2018-12-15 MED FILL — FLUCONAZOLE 150 MG TABS: 150 | 1 days supply | Qty: 1 | Fill #0

## 2018-12-15 NOTE — Progress Notes (Signed)
Patient was inform on lab results and medication sent the pharmacy.   Pt. Have an upcoming appt with the clinical pharmacist and PCP.  Pt. Said she will do a wet prep when she come for her appt. With [facility] for diabetes.

## 2018-12-15 NOTE — Telephone Encounter (Signed)
CMA spoke to patient with Alexis Mooney for translation.  Pt. Understood and is aware of medication sent to the pharmacy.

## 2018-12-15 NOTE — Telephone Encounter (Signed)
Pt would like all medications sent to [facility], please follow up -metFORMIN (GLUCOPHAGE) 500 MG tablet  -traMADol (ULTRAM) 50 MG tablet  -atorvastatin (LIPITOR) 40 MG tablet

## 2018-12-20 ENCOUNTER — Ambulatory Visit: Payer: Self-pay | Attending: Family Medicine | Admitting: Pharmacist

## 2018-12-20 ENCOUNTER — Other Ambulatory Visit: Payer: Self-pay

## 2018-12-20 ENCOUNTER — Ambulatory Visit: Payer: Self-pay

## 2018-12-20 ENCOUNTER — Encounter: Payer: Self-pay | Admitting: Pharmacist

## 2018-12-20 ENCOUNTER — Telehealth: Payer: Self-pay | Admitting: Internal Medicine

## 2018-12-20 VITALS — BP 126/83 | HR 85

## 2018-12-20 DIAGNOSIS — I10 Essential (primary) hypertension: Secondary | ICD-10-CM

## 2018-12-20 DIAGNOSIS — N760 Acute vaginitis: Secondary | ICD-10-CM

## 2018-12-20 NOTE — Progress Notes (Signed)
   S:    Patient arrives in good spirits. Presents to the clinic for BP check. Patient was referred and last seen by Zelda on 11/29/18 via telemedicine. She reported chronic dry cough with lisinopril-HCTZ with resolution upon discontinuation. Zelda stopped this and started amlodipine. Of note, pt was last seen by PCP on 07/05/18.  Patient reports adherence with medications.   She endorses rarely-occuring sensation of pain in her chest. Describes this as a sharp pain that lasts for a couple of seconds. This is not happening currently. She believes that this is her body getting used to her new BP medication. Denies dyspnea, orthopnea, chest pressure, BLE swelling, racing heart beat, or pain in her arms. No facial tingling or dizziness.   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; no exercise outside of work Family / Social history:  - HTN (mother,father) - Never smoker  - Rarely drinks alcohol    O:  Home BP readings: not chekcing  Last 3 Office BP readings: BP Readings from Last 3 Encounters:  12/20/18 126/83  07/05/18 117/86  03/08/18 118/74   BMET    Component Value Date/Time   NA 139 11/30/2018 0924   K 4.3 11/30/2018 0924   CL 102 11/30/2018 0924   CO2 22 11/30/2018 0924   GLUCOSE 210 (H) 11/30/2018 0924   GLUCOSE 94 01/04/2015 1107   BUN 11 11/30/2018 0924   CREATININE 0.53 (L) 11/30/2018 0924   CREATININE 0.46 (L) 01/04/2015 1107   CALCIUM 9.5 11/30/2018 0924   GFRNONAA 110 11/30/2018 0924   GFRAA 126 11/30/2018 0924    Renal function: CrCl cannot be calculated (Unknown ideal weight.).  Clinical ASCVD: No  The 10-year ASCVD risk score Mikey Bussing DC Jr., et al., 2013) is: 3%   Values used to calculate the score:     Age: 86 years     Sex:  Female     Is Non-Hispanic African American: No     Diabetic: No     Tobacco smoker: No     Systolic Blood Pressure: 098 mmHg     Is BP treated: Yes     HDL Cholesterol: 44 mg/dL     Total Cholesterol: 231 mg/dL   A/P: Hypertension longstanding currently controlled on current medications. BP Goal <130/80 mmHg. Patient is adherent with current medications. Pt counseled extensively to monitor her pain. She knows to contact us if this continues, worsens, or occurs more frequently.  -Continued amlodipine 5 mg daily. -Counseled on lifestyle modifications for blood pressure control including reduced dietary sodium, increased exercise, adequate sleep  Results reviewed and written information provided.   Total time in face-to-face counseling 15 minutes.   F/U Clinic Visit next month with PCP.  Benard Halsted, PharmD, [place] [phone]

## 2018-12-20 NOTE — Patient Instructions (Signed)

## 2018-12-20 NOTE — Telephone Encounter (Signed)
Patient came in wanting to get information on how she can apply for the [facility]

## 2018-12-20 NOTE — Telephone Encounter (Signed)
Pt was informed that then card is inside with the [place] letter

## 2018-12-21 LAB — CERVICOVAGINAL ANCILLARY ONLY
Bacterial vaginitis: NEGATIVE
Candida vaginitis: NEGATIVE
Chlamydia: NEGATIVE
Neisseria Gonorrhea: NEGATIVE
Trichomonas: NEGATIVE

## 2018-12-22 ENCOUNTER — Telehealth: Payer: Self-pay

## 2018-12-22 NOTE — Telephone Encounter (Signed)
CMA attempt to reach patient to inform on results.  No answer and left a VM for a call back.  

## 2018-12-22 NOTE — Telephone Encounter (Signed)
-----   Message from Gildardo Pounds, NP sent at 12/21/2018 10:29 PM EDT ----- Labs negative for any vaginal bacteria, chlamydia, gonorrhea, or trichomonas

## 2018-12-23 NOTE — Telephone Encounter (Signed)
Patient called back for their results. Patient states it is best to reach her in the morning please follow up.

## 2018-12-26 MED FILL — ?AMLODIPINE BESYLATE 5MG TA: 5 | 30 days supply | Qty: 30 | Fill #1

## 2018-12-26 MED FILL — traMADol HCL 50 MG TABS: 50 | 30 days supply | Qty: 60 | Fill #0

## 2018-12-26 MED FILL — MELOXICAM 15 MG TABLET: 15 | 30 days supply | Qty: 30 | Fill #2

## 2018-12-26 NOTE — Telephone Encounter (Signed)
Patient called back and spoke to [facility] inform on lab results.

## 2019-01-16 ENCOUNTER — Ambulatory Visit: Payer: No Typology Code available for payment source | Admitting: Internal Medicine

## 2019-01-25 ENCOUNTER — Other Ambulatory Visit: Payer: Self-pay | Admitting: Internal Medicine

## 2019-01-25 MED FILL — metFORMIN HCL 500 MG TABS: 500 | 22 days supply | Qty: 180 | Fill #1

## 2019-01-25 MED FILL — ATORVASTATIN CALCIUM 40 MG: 40 | 30 days supply | Qty: 30 | Fill #1

## 2019-01-25 MED FILL — MELOXICAM 15 MG TABLET: 15 | 30 days supply | Qty: 30 | Fill #3

## 2019-01-25 MED FILL — traMADol HCL 50 MG TABS: 50 | 30 days supply | Qty: 60 | Fill #0

## 2019-01-25 NOTE — Telephone Encounter (Signed)
Please contact pt and schedule a televisit

## 2019-01-25 NOTE — Telephone Encounter (Signed)
Called pt lvm for pt to schedule appt with Alexis Mooney late September 2020. No refill allowed again until pt is seen.

## 2019-01-30 ENCOUNTER — Other Ambulatory Visit: Payer: Self-pay | Admitting: Pharmacist

## 2019-01-30 DIAGNOSIS — I10 Essential (primary) hypertension: Secondary | ICD-10-CM

## 2019-01-30 MED ORDER — AMLODIPINE BESYLATE 5 MG PO TABS: 5.0000 mg | ORAL_TABLET | Freq: Every day | ORAL | 2 refills | Status: DC

## 2019-01-30 MED FILL — AMLODIPINE BESYLATE 5 MG TA: 5 | 30 days supply | Qty: 30 | Fill #0
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
## 2019-01-31 ENCOUNTER — Ambulatory Visit: Payer: Self-pay | Attending: Critical Care Medicine | Admitting: Critical Care Medicine

## 2019-01-31 ENCOUNTER — Encounter: Payer: Self-pay | Admitting: Critical Care Medicine

## 2019-01-31 ENCOUNTER — Other Ambulatory Visit: Payer: Self-pay

## 2019-01-31 DIAGNOSIS — R300 Dysuria: Secondary | ICD-10-CM

## 2019-01-31 DIAGNOSIS — R350 Frequency of micturition: Secondary | ICD-10-CM | POA: Insufficient documentation

## 2019-01-31 DIAGNOSIS — Z23 Encounter for immunization: Secondary | ICD-10-CM

## 2019-01-31 DIAGNOSIS — R1013 Epigastric pain: Secondary | ICD-10-CM | POA: Insufficient documentation

## 2019-01-31 DIAGNOSIS — M170 Bilateral primary osteoarthritis of knee: Secondary | ICD-10-CM

## 2019-01-31 MED ORDER — MELOXICAM 15 MG PO TABS: 15.0000 mg | ORAL_TABLET | Freq: Every day | ORAL | 2 refills | Status: DC

## 2019-01-31 MED ORDER — TRUE METRIX BLOOD GLUCOSE TEST VI STRP: ORAL_STRIP | 12 refills | Status: DC

## 2019-01-31 MED ORDER — TRUEPLUS LANCETS 28G MISC: 3 refills | Status: DC

## 2019-01-31 MED ORDER — OMEPRAZOLE 20 MG PO CPDR: 20.0000 mg | DELAYED_RELEASE_CAPSULE | Freq: Every day | ORAL | 3 refills | Status: DC

## 2019-01-31 MED FILL — OMEPRAZOLE 20 MG CAP: 20 | 30 days supply | Qty: 30 | Fill #0

## 2019-01-31 MED FILL — TRUE METRIX TEST STRIP: 50 days supply | Qty: 100 | Fill #0

## 2019-01-31 MED FILL — TRUEplus LANCETS 28G MISC: 50 days supply | Qty: 100 | Fill #0

## 2019-01-31 NOTE — Assessment & Plan Note (Addendum)
Primary bilateral osteoarthritis of both knees and the patient wishes orthopedic evaluation  We will refill Mobic however will not refill tramadol per patient's request at this time  Will refer to orthopedics for further evaluation of the knees

## 2019-01-31 NOTE — Assessment & Plan Note (Signed)
Epigastric pain with concern about potential gastric cancer  Will refer to gastroenterology for evaluation as the patient may need upper endoscopy for screening  Will begin omeprazole 20 mg daily

## 2019-01-31 NOTE — Patient Instructions (Addendum)
A referral to gastroenterology and orthopedics will be made  We are checking her urine for a bladder infection we will call you with results  Refills on meloxicam and omeprazole along with your glucose testing strips and lancets were sent to our pharmacy please pick this up  Take the meloxicam omeprazole daily for your knee pain and her gastritis symptoms   A flu and tetanus vaccine was given  Return to see Dr Wynetta Emery in two months

## 2019-01-31 NOTE — Assessment & Plan Note (Signed)
Will send a urinalysis and also urine culture for evaluation

## 2019-02-01 LAB — URINALYSIS, COMPLETE
Bilirubin, UA: NEGATIVE
Glucose, UA: NEGATIVE
Ketones, UA: NEGATIVE
Nitrite, UA: NEGATIVE
RBC, UA: NEGATIVE
Specific Gravity, UA: 1.023 (ref 1.005–1.030)
Urobilinogen, Ur: 0.2 mg/dL (ref 0.2–1.0)
pH, UA: 5 (ref 5.0–7.5)

## 2019-02-01 LAB — MICROSCOPIC EXAMINATION
Bacteria, UA: NONE SEEN
Casts: NONE SEEN /lpf
RBC: NONE SEEN /hpf (ref 0–2)

## 2019-02-02 ENCOUNTER — Telehealth: Payer: Self-pay | Admitting: Critical Care Medicine

## 2019-02-02 MED ORDER — CIPROFLOXACIN HCL 500 MG PO TABS: 500.0000 mg | ORAL_TABLET | Freq: Two times a day (BID) | ORAL | 0 refills | Status: DC

## 2019-02-02 MED FILL — CIPROFLOXACIN HCL 500 MG TA: 500 | 3 days supply | Qty: 6 | Fill #0

## 2019-02-02 NOTE — Telephone Encounter (Signed)
LMOM

## 2019-02-02 NOTE — Telephone Encounter (Signed)
Urine culture and UA suggest UTI.  Ecoli growing  I called the patient with the result and will order Cipro Rx to our pharmacy.

## 2019-02-03 LAB — URINE CULTURE

## 2019-02-03 NOTE — Telephone Encounter (Signed)
noted 

## 2019-02-06 ENCOUNTER — Telehealth: Payer: Self-pay | Admitting: Orthopaedic Surgery

## 2019-02-06 NOTE — Telephone Encounter (Signed)
Patient called advised she fell 02/04/2019 but, is feeling better. Patient  advised she is still having some pain. Patient asked what is the cost of the cortisone injection and the gel injection. Patient want to know if she can afford it or not.  Patient asked does she still need to keep her appointment on Friday. The number to contact patient is [phone]

## 2019-02-06 NOTE — Telephone Encounter (Signed)
Do you know cost of gel injection for patient?

## 2019-02-07 ENCOUNTER — Ambulatory Visit: Payer: Self-pay | Attending: Internal Medicine | Admitting: Pharmacist

## 2019-02-07 ENCOUNTER — Other Ambulatory Visit: Payer: Self-pay

## 2019-02-07 DIAGNOSIS — Z79899 Other long term (current) drug therapy: Secondary | ICD-10-CM

## 2019-02-07 NOTE — Telephone Encounter (Signed)
Patient came by the office today and I provided her a list of the gel injections with the cost for each one.  Advised patient that I would check to see if the [facility] financial Assistance will cover the gel injections.  Per Wendy May, it does not cover gel injections, but it will cover for a cortisone injection and I could apply for [employer] patient assistance if patient would like to have gel injections for both knees.  Called and left patient a VM advising her of the message above and to give me a call back with her decision.

## 2019-02-07 NOTE — Progress Notes (Signed)
Patient was educated on the use of the True Metrix blood glucose meter. Reviewed necessary supplies and operation of the meter. Also reviewed goal blood glucose levels. Patient was able to demonstrate use. All questions and concerns were addressed.   

## 2019-02-08 NOTE — Addendum Note (Signed)
Addended by: Emilio Aspen A on: 02/08/2019 03:04 PM   Modules accepted: Orders

## 2019-02-10 ENCOUNTER — Ambulatory Visit (INDEPENDENT_AMBULATORY_CARE_PROVIDER_SITE_OTHER): Payer: Self-pay

## 2019-02-10 ENCOUNTER — Ambulatory Visit: Payer: Self-pay

## 2019-02-10 ENCOUNTER — Ambulatory Visit (INDEPENDENT_AMBULATORY_CARE_PROVIDER_SITE_OTHER): Payer: No Typology Code available for payment source | Admitting: Orthopaedic Surgery

## 2019-02-10 ENCOUNTER — Encounter: Payer: Self-pay | Admitting: Orthopaedic Surgery

## 2019-02-10 VITALS — Ht 61.0 in | Wt 234.0 lb

## 2019-02-10 DIAGNOSIS — M65341 Trigger finger, right ring finger: Secondary | ICD-10-CM

## 2019-02-10 DIAGNOSIS — M170 Bilateral primary osteoarthritis of knee: Secondary | ICD-10-CM

## 2019-02-10 MED ORDER — LIDOCAINE HCL 1 % IJ SOLN
2.0000 mL | INTRAMUSCULAR | Status: AC | PRN
  Administered 2019-02-10: 2 mL

## 2019-02-10 MED ORDER — BUPIVACAINE HCL 0.25 % IJ SOLN
2.0000 mL | INTRAMUSCULAR | Status: AC | PRN
  Administered 2019-02-10: 2 mL via INTRA_ARTICULAR

## 2019-02-10 MED ORDER — METHYLPREDNISOLONE ACETATE 40 MG/ML IJ SUSP
40.0000 mg | INTRAMUSCULAR | Status: AC | PRN
  Administered 2019-02-10: 40 mg via INTRA_ARTICULAR

## 2019-02-10 MED ORDER — BUPIVACAINE HCL 0.25 % IJ SOLN
0.3300 mL | INTRAMUSCULAR | Status: AC | PRN
  Administered 2019-02-10: 10:00:00 .33 mL

## 2019-02-10 MED ORDER — LIDOCAINE HCL 1 % IJ SOLN
1.0000 mL | INTRAMUSCULAR | Status: AC | PRN
  Administered 2019-02-10: 10:00:00 1 mL

## 2019-02-10 MED ORDER — METHYLPREDNISOLONE ACETATE 40 MG/ML IJ SUSP
13.3300 mg | INTRAMUSCULAR | Status: AC | PRN
  Administered 2019-02-10: 10:00:00 13.33 mg

## 2019-02-10 NOTE — Progress Notes (Signed)
Office Visit Note   Patient: Alexis Mooney           Date of Birth: 04/18/1966           MRN: [id number] Visit Date: 02/10/2019              Requested by: Marcine MatarJohnson, Deborah B, MD [address] [place],  [place] [id number] PCP: Marcine MatarJohnson, Deborah B, MD   Assessment & Plan: Visit Diagnoses:  1. Primary osteoarthritis of both knees   2. Trigger ring finger of right hand     Plan: Impression is end-stage degenerative joint disease both knees and right ring trigger finger.  We will inject the left, more symptomatic knee today as well as the right trigger finger.  She will follow-up with us in 2 weeks time for right knee cortisone injection.  We also discussed viscosupplementation injection at today's visit  Follow-Up Instructions: Return in about 2 weeks (around 02/24/2019) for right knee cortisone injection.   Orders:  Orders Placed This Encounter  Procedures  . Large Joint Inj: L knee  . Hand/UE Inj: R ring A1  . XR KNEE 3 VIEW LEFT  . XR KNEE 3 VIEW RIGHT  . XR Finger Ring Right   No orders of the defined types were placed in this encounter.     Procedures: Large Joint Inj: L knee on 02/10/2019 9:45 AM Indications: pain Details: 22 G needle, anterolateral approach Medications: 2 mL lidocaine 1 %; 2 mL bupivacaine 0.25 %; 40 mg methylPREDNISolone acetate 40 MG/ML  Hand/UE Inj: R ring A1 for trigger finger on 02/10/2019 9:45 AM Indications: pain Details: 25 G needle Medications: 0.33 mL bupivacaine 0.25 %; 1 mL lidocaine 1 %; 13.33 mg methylPREDNISolone acetate 40 MG/ML      Clinical Data: No additional findings.   Subjective: Chief Complaint  Patient presents with  . Left Knee - Pain  . Right Knee - Pain  . Right Hand - Pain    HPI patient is a pleasant 53 year old female who presents our clinic today with bilateral knee pain left greater than right as well as right hand ring trigger finger.  In regards to her knees, she has had pain to both knees for the  past few years.  She was seen in our office approximately 2 years ago were both knees were injected with cortisone.  She notes mild improvement of symptoms following these injections.  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  Pain she has to the entire aspect of both knees.  She describes this as a constant ache worse when working.  She has been taking meloxicam with mild relief of symptoms.  In regards to her right hand ring finger, she has noticed pain and triggering over the past 3 months.  No known injury or change in activity.  She is right-handed and does a lot with activity with her hands while at work.  She denies any numbness, tingling or burning.  Review of Systems as detailed in HPI.  All others reviewed and are negative.   Objective: Vital Signs: Ht 5\' 1"  (1.549 m)   Wt 234 lb (106.1 kg)   BMI 44.21 kg/m   Physical Exam well-developed well-nourished female in no acute distress.  Alert and oriented x3.  Ortho Exam examination of both knees reveals range of motion from 0 to 115 degrees.  Marked tenderness medial and lateral  joint line left knee.  No joint line tenderness to the right knee.  Marked patellofemoral crepitus right greater than left.  She is stable valgus varus stress.  She is neurovascular intact distally.  Specialty Comments:  No specialty comments available.  Imaging: Xr Finger Ring Right  Result Date: 02/10/2019 Marked lateral and patellofemoral changes with tricompartmental spurring  Xr Knee 3 View Left  Result Date: 02/10/2019 Marked medial and patellofemoral changes with tricompartmental spurring  Xr Knee 3 View Right  Result Date: 02/10/2019 Slight degenerative changes throughout.    PMFS History: Patient Active Problem List   Diagnosis Date Noted  . Frequency of urination 01/31/2019  . Dysuria 01/31/2019  . Epigastric pain 01/31/2019  . Primary osteoarthritis of both  knees 07/06/2018  . Memory disorder 02/04/2018  . Anxiety and depression 03/19/2017  . Hair loss 03/19/2017  . High triglycerides 12/29/2016  . Morbid obesity with BMI of 40.0-44.9, adult ([place]) 10/19/2016  . Perimenopause 01/22/2014  . Essential hypertension 01/22/2014   Past Medical History:  Diagnosis Date  . Abscess   . Hypertension   . Miscarriage   . Pollen allergies     Family History  Problem Relation Age of Onset  . Cancer Sister 40       stomach  . Hypertension Mother   . Hypertension Father   . Breast cancer Maternal Aunt   . Breast cancer Cousin     Past Surgical History:  Procedure Laterality Date  . ABSCESS DRAINAGE    . BREAST BIOPSY Left    [place] Core bx, 2010?, pt not sure when   Social History   Occupational History  . Not on file  Tobacco Use  . Smoking status: Former Smoker    Quit date: 09/24/1994    Years since quitting: 24.3  . Smokeless tobacco: Never Used  Substance and Sexual Activity  . Alcohol use: Yes    Comment: rarely  . Drug use: No  . Sexual activity: Yes    Birth control/protection: None

## 2019-02-15 ENCOUNTER — Other Ambulatory Visit (HOSPITAL_COMMUNITY): Payer: Self-pay | Admitting: *Deleted

## 2019-02-15 DIAGNOSIS — Z1231 Encounter for screening mammogram for malignant neoplasm of breast: Secondary | ICD-10-CM

## 2019-02-27 ENCOUNTER — Telehealth: Payer: Self-pay | Admitting: Orthopaedic Surgery

## 2019-02-27 NOTE — Telephone Encounter (Signed)
I left detailed message for patient requesting return call to make follow up appt with Dr. Erlinda Hong and to let us know if she needs work note.

## 2019-02-27 NOTE — Telephone Encounter (Signed)
I called patient. She states that neither the trigger finger injection or knee injection worked.  She states that the finger has locked again and she has to use other hand to open it. She also noticed no relief from knee injection. She has a lot of pain when standing on it and trying to work. Patient would like to know what the next steps are.  Patient requests return call before 1p if possible because she will be going to work and is unable to use cell phone. I advised Dr. Erlinda Hong is in [place] and we will call once he has a chance to review message. Patient expressed understanding.  Please advise.

## 2019-02-27 NOTE — Telephone Encounter (Signed)
Patient called to let Dr. Erlinda Hong know that the injection did not work.  Patient would like to talk to Dr. Erlinda Hong in reference to what can be now since the cortisone injection did not work.  CB#[phone].  Thank you.

## 2019-02-27 NOTE — Telephone Encounter (Signed)
She can come back to see [place] in the office.  We can write her out of work if needed

## 2019-02-28 ENCOUNTER — Ambulatory Visit: Payer: No Typology Code available for payment source | Admitting: Orthopaedic Surgery

## 2019-03-07 ENCOUNTER — Encounter: Payer: Self-pay | Admitting: Internal Medicine

## 2019-03-08 ENCOUNTER — Other Ambulatory Visit: Payer: Self-pay | Admitting: Internal Medicine

## 2019-03-08 MED FILL — ?ATORVASTATIN 40MG TABLET: 40 | 30 days supply | Qty: 30 | Fill #2

## 2019-03-08 MED FILL — ?AMLODIPINE BESYLATE 5MG TA: 5 | 30 days supply | Qty: 30 | Fill #1

## 2019-03-08 MED FILL — MELOXICAM 15 MG TABLET: 15 | 30 days supply | Qty: 30 | Fill #0

## 2019-03-09 ENCOUNTER — Telehealth: Payer: Self-pay | Admitting: Internal Medicine

## 2019-03-09 NOTE — Telephone Encounter (Signed)
Pt would like a call back in regards to her facility bills, she has a 75% discount and would like to make sure it is being applied, she also has questions about a flu shot she received if it will be covered under the discount. Please follow up

## 2019-03-09 NOTE — Telephone Encounter (Signed)
Pt was informed to [employer] the billing dept. An to make sure they 75% discount has been apply to each of her bills

## 2019-03-10 ENCOUNTER — Other Ambulatory Visit: Payer: Self-pay | Admitting: Internal Medicine

## 2019-04-04 ENCOUNTER — Ambulatory Visit: Payer: No Typology Code available for payment source | Admitting: Internal Medicine

## 2019-04-05 ENCOUNTER — Other Ambulatory Visit: Payer: Self-pay | Admitting: Internal Medicine

## 2019-04-05 MED FILL — MELOXICAM 15 MG TABLET: 15 | 30 days supply | Qty: 30 | Fill #1

## 2019-04-05 MED FILL — ?ATORVASTATIN 40MG TABLET: 40 | 30 days supply | Qty: 30 | Fill #3

## 2019-04-05 MED FILL — ?AMLODIPINE BESYLATE 5MG TA: 5 | 30 days supply | Qty: 30 | Fill #2

## 2019-04-05 NOTE — Telephone Encounter (Signed)
Patient requesting a refill. Last noted in chart in January.

## 2019-04-25 ENCOUNTER — Encounter (HOSPITAL_COMMUNITY): Payer: Self-pay

## 2019-04-25 ENCOUNTER — Other Ambulatory Visit (HOSPITAL_COMMUNITY): Payer: Self-pay | Admitting: *Deleted

## 2019-04-25 ENCOUNTER — Ambulatory Visit (HOSPITAL_COMMUNITY)
Admission: RE | Admit: 2019-04-25 | Discharge: 2019-04-25 | Disposition: A | Discharge status: Home / Self Care | Payer: Self-pay | Source: Ambulatory Visit | Attending: Obstetrics and Gynecology | Admitting: Obstetrics and Gynecology

## 2019-04-25 ENCOUNTER — Other Ambulatory Visit: Payer: Self-pay

## 2019-04-25 ENCOUNTER — Ambulatory Visit: Payer: Self-pay

## 2019-04-25 DIAGNOSIS — R2232 Localized swelling, mass and lump, left upper limb: Secondary | ICD-10-CM

## 2019-04-25 DIAGNOSIS — Z1239 Encounter for other screening for malignant neoplasm of breast: Secondary | ICD-10-CM

## 2019-04-25 HISTORY — DX: Type 2 diabetes mellitus without complications: E11.9

## 2019-04-25 HISTORY — DX: Pure hypercholesterolemia, unspecified: E78.00

## 2019-04-25 NOTE — Progress Notes (Signed)
Complaints of right breast pain x three times around 2 weeks ago. Patient rated the pain at a 3 out of 10. Patient states she is not having any breast pain today.   Pap Smear: Pap smear not completed today. Last Pap smear was 03/08/2018 at [facility] and normal with negative HPV. Per patient has no history of an abnormal Pap smear. Last two Pap smear results are in Epic.  Physical exam: Breasts Breasts symmetrical. No skin abnormalities bilateral breasts. No nipple retraction bilateral breasts. No nipple discharge bilateral breasts. No lymphadenopathy. No lumps palpated right breast. Palpated a left axillary lump at 3 o'clock 18 cm from the nipple. No complaints of pain or tenderness on exam. Referred patient to the [facility] for a diagnostic mammogram and left breast ultrasound. Appointment scheduled for Tuesday, May 02, 2019 at 1000.        Pelvic/Bimanual No Pap smear completed today since last Pap smear and HPV typing was 03/08/2018. Pap smear not indicated per BCCCP guidelines.   Smoking History: Patient has never smoked.  Patient Navigation: Patient education provided. Access to services provided for patient through [facility] program. Spanish interpreter provided.   Colorectal Cancer Screening: Per patient had a colonoscopy completed 25 years ago. FIT Test completed 03/14/2018 and negative. No complaints today.   Breast and Cervical Cancer Risk Assessment: Patient has a family history of three paternal aunts having breast cancer. Patient has no known genetic mutations or history of radiation treatment to the chest before age 37. Patient has no history of cervical dysplasia, immunocompromised, or DES exposure in-utero.  Risk Assessment    Risk Scores      04/25/2019 03/08/2018   Last edited by: Loletta Parish, RN Armond Hang, LPN   5-year risk: 0.9 % 0.9 %   Lifetime risk: 7.1 % 7.4 %           Used Spanish interpreter Rudene Anda from  [place].

## 2019-04-25 NOTE — Patient Instructions (Signed)
Explained breast self awareness with [name]. Patient did not need a Pap smear today due to last Pap smear and HPV typing was 03/08/2018. Let her know BCCCP will cover Pap smears and HPV typing every 5 years unless has a history of abnormal Pap smears. Referred patient to the [facility] for a diagnostic mammogram and left breast ultrasound. Appointment scheduled for Tuesday, May 02, 2019 at 1000. Patient aware of appointment and will be there. Shalandra Goyzueta-Hurtado verbalized understanding.  Dorothye Berni, [name], RN 8:56 AM

## 2019-05-02 ENCOUNTER — Other Ambulatory Visit: Payer: Self-pay

## 2019-05-02 ENCOUNTER — Ambulatory Visit
Admission: RE | Admit: 2019-05-02 | Discharge: 2019-05-02 | Disposition: A | Discharge status: Home / Self Care | Payer: No Typology Code available for payment source | Source: Ambulatory Visit | Attending: Obstetrics and Gynecology | Admitting: Obstetrics and Gynecology

## 2019-05-02 ENCOUNTER — Other Ambulatory Visit (HOSPITAL_COMMUNITY): Payer: Self-pay | Admitting: Obstetrics and Gynecology

## 2019-05-02 DIAGNOSIS — R2232 Localized swelling, mass and lump, left upper limb: Secondary | ICD-10-CM

## 2019-05-02 DIAGNOSIS — N6310 Unspecified lump in the right breast, unspecified quadrant: Secondary | ICD-10-CM

## 2019-05-18 ENCOUNTER — Other Ambulatory Visit: Payer: Self-pay | Admitting: Internal Medicine

## 2019-05-18 ENCOUNTER — Other Ambulatory Visit: Payer: Self-pay | Admitting: Pharmacist

## 2019-05-18 DIAGNOSIS — I10 Essential (primary) hypertension: Secondary | ICD-10-CM

## 2019-05-18 MED ORDER — AMLODIPINE BESYLATE 5 MG PO TABS: 5.0000 mg | ORAL_TABLET | Freq: Every day | ORAL | 0 refills | Status: DC

## 2019-05-18 MED FILL — ?OMEPRAZOLE 20MG CAP DR: 20 | 30 days supply | Qty: 30 | Fill #1

## 2019-05-18 MED FILL — ?ATORVASTATIN 40MG TABLET: 40 | 30 days supply | Qty: 30 | Fill #4

## 2019-05-18 MED FILL — TRUE METRIX TEST STRIP: 50 days supply | Qty: 100 | Fill #1

## 2019-05-18 MED FILL — MELOXICAM 15 MG TABLET: 15 | 30 days supply | Qty: 30 | Fill #2

## 2019-05-18 MED FILL — ?AMLODIPINE BESYLATE 5 MG T: 5 MG | 30 days supply | Qty: 30 | Fill #0

## 2019-05-18 MED FILL — ?METFORMIN HCL 500MG TABLET: 500 | 30 days supply | Qty: 120 | Fill #2

## 2019-05-18 MED FILL — TRUEplus LANCETS 28G MISC: 50 days supply | Qty: 100 | Fill #1

## 2019-06-22 MED FILL — ?METFORMIN HCL 500MG TABLET: 500 | 30 days supply | Qty: 120 | Fill #3

## 2019-06-22 MED FILL — TRUEplus LANCETS 28G MISC: 50 days supply | Qty: 100 | Fill #2

## 2019-06-22 MED FILL — MELOXICAM 15 MG TABLET: 15 | 30 days supply | Qty: 30 | Fill #0

## 2019-06-22 MED FILL — ?OMEPRAZOLE 20MG CAP DR: 20 | 30 days supply | Qty: 30 | Fill #2

## 2019-06-22 MED FILL — ATORVASTATIN CALCIUM 40 MG: 40 | 30 days supply | Qty: 30 | Fill #5

## 2019-06-22 MED FILL — TRUE METRIX TEST STRIP: 50 days supply | Qty: 100 | Fill #2

## 2019-06-27 ENCOUNTER — Telehealth: Payer: Self-pay | Admitting: Internal Medicine

## 2019-06-27 DIAGNOSIS — I10 Essential (primary) hypertension: Secondary | ICD-10-CM

## 2019-06-27 NOTE — Telephone Encounter (Signed)
1) Medication(s) Requested (by name): Amlodipine   2) Pharmacy of Choice: [facility] pharmacy   3) Special Requests:   Approved medications will be sent to the pharmacy, we will reach out if there is an issue.  Requests made after 3pm may not be addressed until the following business day!  If a patient is unsure of the name of the medication(s) please note and ask patient to call back when they are able to provide all info, do not send to responsible party until all information is available!

## 2019-06-28 MED ORDER — AMLODIPINE BESYLATE 5 MG PO TABS: 5.0000 mg | ORAL_TABLET | Freq: Every day | ORAL | 0 refills | Status: DC

## 2019-06-28 MED FILL — AMLODIPINE BESYLATE 5 MG TA: 5 | 30 days supply | Qty: 30 | Fill #0

## 2019-06-28 NOTE — Telephone Encounter (Signed)
Sent!

## 2019-07-11 ENCOUNTER — Ambulatory Visit: Payer: Self-pay | Attending: Internal Medicine | Admitting: Internal Medicine

## 2019-07-11 ENCOUNTER — Encounter: Payer: Self-pay | Admitting: Internal Medicine

## 2019-07-11 ENCOUNTER — Other Ambulatory Visit: Payer: Self-pay | Admitting: Internal Medicine

## 2019-07-11 ENCOUNTER — Other Ambulatory Visit: Payer: Self-pay

## 2019-07-11 DIAGNOSIS — Z1211 Encounter for screening for malignant neoplasm of colon: Secondary | ICD-10-CM

## 2019-07-11 DIAGNOSIS — E119 Type 2 diabetes mellitus without complications: Secondary | ICD-10-CM | POA: Insufficient documentation

## 2019-07-11 DIAGNOSIS — Z6841 Body Mass Index (BMI) 40.0 and over, adult: Secondary | ICD-10-CM

## 2019-07-11 DIAGNOSIS — M170 Bilateral primary osteoarthritis of knee: Secondary | ICD-10-CM

## 2019-07-11 DIAGNOSIS — E785 Hyperlipidemia, unspecified: Secondary | ICD-10-CM | POA: Insufficient documentation

## 2019-07-11 DIAGNOSIS — E1169 Type 2 diabetes mellitus with other specified complication: Secondary | ICD-10-CM

## 2019-07-11 DIAGNOSIS — K219 Gastro-esophageal reflux disease without esophagitis: Secondary | ICD-10-CM

## 2019-07-11 DIAGNOSIS — I10 Essential (primary) hypertension: Secondary | ICD-10-CM

## 2019-07-11 HISTORY — DX: Type 2 diabetes mellitus with other specified complication: E11.69

## 2019-07-11 HISTORY — DX: Hyperlipidemia, unspecified: E78.5

## 2019-07-11 MED ORDER — METFORMIN HCL 1000 MG PO TABS: 1000.0000 mg | ORAL_TABLET | Freq: Two times a day (BID) | ORAL | 6 refills | Status: DC

## 2019-07-11 MED ORDER — ATORVASTATIN CALCIUM 40 MG PO TABS: 40.0000 mg | ORAL_TABLET | Freq: Every day | ORAL | 1 refills | Status: DC

## 2019-07-11 MED ORDER — MELOXICAM 15 MG PO TABS: 15.0000 mg | ORAL_TABLET | Freq: Every day | ORAL | 1 refills | Status: DC

## 2019-07-11 MED ORDER — AMLODIPINE BESYLATE 5 MG PO TABS: 5.0000 mg | ORAL_TABLET | Freq: Every day | ORAL | 1 refills | Status: DC

## 2019-07-11 MED ORDER — OMEPRAZOLE 20 MG PO CPDR: 20.0000 mg | DELAYED_RELEASE_CAPSULE | Freq: Every day | ORAL | 1 refills | Status: DC

## 2019-07-11 MED ORDER — TRAMADOL HCL 50 MG PO TABS: 50.0000 mg | ORAL_TABLET | Freq: Two times a day (BID) | ORAL | 1 refills | Status: DC | PRN

## 2019-07-11 MED FILL — traMADol HCL 50 MG TABS: 50 | 30 days supply | Qty: 60 | Fill #0

## 2019-07-11 MED FILL — metFORMIN HCL 1000 MG TABS: 1000 | 30 days supply | Qty: 60 | Fill #0

## 2019-07-11 NOTE — Progress Notes (Signed)
Pt is requesting a refill on Tramadol.

## 2019-07-11 NOTE — Progress Notes (Signed)
Virtual Visit via Telephone Note Due to current restrictions/limitations of in-office visits due to the COVID-19 pandemic, this scheduled clinical appointment was converted to a telehealth visit  I connected with Alexis Mooney on 07/11/19 at 11:21 a.m by telephone and verified that I am speaking with the correct person using two identifiers. I am in my office.  The patient is at home.  Only the patient and myself participated in this encounter.  I discussed the limitations, risks, security and privacy concerns of performing an evaluation and management service by telephone and the availability of in person appointments. I also discussed with the patient that there may be a patient responsible charge related to this service. The patient expressed understanding and agreed to proceed.   History of Present Illness: History of HTN,morbidobesity, osteoarthritis of the knees,hyperlipidemia, DM.  Last seen by Dr. Joya Gaskins 02/2019.  Last saw me 06/2018  OA:  Knees painful and swells intermittently.  Needing RF on Tramadol and Meloxicam.  Out of Tramadol 2-3 mths.  Struggles at work to walk and stand for hrs without taking the Tramadol.  Has to take breaks where she goes and sits in the bathroom for several minutes at a time to rest her knees.  Saw Dr. Erlinda Hong and had inj to one knee.  She did not find it helpful -Reports she was not keeping regular appts with me because she is not working as much as before and bills are accumulating.  Reports she has bill with [facility] which she is paying off over time. Also got bill from ortho  DM:  On visit with NP 11/2018, A1C came back in range for DM.  A1C was 8.9 Rxn sent for glucometer.  She did get the meter and checks BS occasionally.  She does not have any readings att this time.  -taking Metformin but only 500 mg BID not 1 gram BID as planned. Her cholesterol was also elev, LDL 133.  Started on Lipitor which she is taking.  No muscle pain She feels she has loss  some wgh but needs some dietary counseling -endorses poor vision when reading. -no numbness in feet  HTN:  Has device at home to check BP but not checking Limits salt in food. Compliant with Norvasc No CP/SOB/LE edema  Outpatient Encounter Medications as of 07/11/2019  Medication Sig  . amLODipine (NORVASC) 5 MG tablet Take 1 tablet (5 mg total) by mouth daily.  Marland Kitchen atorvastatin (LIPITOR) 40 MG tablet Take 1 tablet (40 mg total) by mouth daily.  . Blood Glucose Monitoring Suppl (TRUE METRIX METER) w/Device KIT Use as instructed. Check blood glucose level by fingerstick twice per day.  . ciprofloxacin (CIPRO) 500 MG tablet Take 1 tablet (500 mg total) by mouth 2 (two) times daily.  Marland Kitchen glucose blood (TRUE METRIX BLOOD GLUCOSE TEST) test strip Use as instructed. Check blood glucose level by fingerstick twice per day.  . hydrocortisone 2.5 % cream Apply topically daily. For 4 days  . loratadine (CLARITIN) 10 MG tablet Take 1 tablet (10 mg total) by mouth daily.  . meloxicam (MOBIC) 15 MG tablet Take 1 tablet (15 mg total) by mouth daily.  . metFORMIN (GLUCOPHAGE) 500 MG tablet Take 1 (one) tablet by mouth two times per day with a meal. After 2 weeks increase to 2 (two) tablets by mouth twice per day with a meal.  . omeprazole (PRILOSEC) 20 MG capsule Take 1 capsule (20 mg total) by mouth daily.  . TRUEplus Lancets 28G MISC Use as  instructed. Check blood glucose level by fingerstick twice per day.   No facility-administered encounter medications on file as of 07/11/2019.      Observations/Objective: Lab Results  Component Value Date   HGBA1C 8.9 (H) 11/30/2018     Chemistry      Component Value Date/Time   NA 139 11/30/2018 0924   K 4.3 11/30/2018 0924   CL 102 11/30/2018 0924   CO2 22 11/30/2018 0924   BUN 11 11/30/2018 0924   CREATININE 0.53 (L) 11/30/2018 0924   CREATININE 0.46 (L) 01/04/2015 1107      Component Value Date/Time   CALCIUM 9.5 11/30/2018 0924   ALKPHOS 82 11/30/2018  0924   AST 33 11/30/2018 0924   ALT 40 (H) 11/30/2018 0924   BILITOT 0.5 11/30/2018 0924     Lab Results  Component Value Date   CHOL 231 (H) 11/30/2018   HDL 44 11/30/2018   LDLCALC 133 (H) 11/30/2018   TRIG 270 (H) 11/30/2018   CHOLHDL 5.3 (H) 11/30/2018    Assessment and Plan: 1. Primary osteoarthritis of both knees Strongly advised and encouraged weight loss through better eating habits. -Refill given on tramadol.  She derives benefit from the medication and denies any significant side effects.  However patient advised that she must keep routine follow-up visits with me in order for the medicine to continue being prescribed to her. Last urine drug screen 11/2018 [facility] controlled substance reporting system reviewed.  Last tramadol prescription was 01/2019 She will need an up-to-date controlled substance prescribing agreement on her next in person visit - meloxicam (MOBIC) 15 MG tablet; Take 1 tablet (15 mg total) by mouth daily.  Dispense: 90 tablet; Refill: 1 - traMADol (ULTRAM) 50 MG tablet; Take 1 tablet (50 mg total) by mouth every 12 (twelve) hours as needed.  Dispense: 60 tablet; Refill: 1  2. Type 2 diabetes mellitus without complication, without long-term current use of insulin ([place]) Discussed diagnosis of diabetes with her. Discussed the importance of healthy eating habits and trying to move as much as her knees will allow to help in control of diabetes. Dietary counseling given.  Advised to eliminate sugary drinks from the diet, cut back on white carbohydrates, eat more white meat than red meat, and incorporate fresh fruits and vegetables into the diet. -Encouraged her to check blood sugars at least 3 times a week before meals with goal being 90-130 -Advised of the importance of yearly eye exam.  Encouraged her to get 1 when she is able to afford it - metFORMIN (GLUCOPHAGE) 1000 MG tablet; Take 1 tablet (1,000 mg total) by mouth 2 (two) times daily with a meal.   Dispense: 180 tablet; Refill: 6 - Hemoglobin A1c; Future  3. Morbid obesity with BMI of 40.0-44.9, adult ([place]) See #2 above  4. Essential hypertension Advised to check blood pressure at least twice a week with goal being 130/80 or lower - amLODipine (NORVASC) 5 MG tablet; Take 1 tablet (5 mg total) by mouth daily.  Dispense: 90 tablet; Refill: 1  5. Hyperlipidemia associated with type 2 diabetes mellitus (HCC) - atorvastatin (LIPITOR) 40 MG tablet; Take 1 tablet (40 mg total) by mouth daily.  Dispense: 90 tablet; Refill: 1 - Lipid panel; Future - Hepatic Function Panel; Future  6. Gastroesophageal reflux disease without esophagitis Patient requested refill on Prilosec - omeprazole (PRILOSEC) 20 MG capsule; Take 1 capsule (20 mg total) by mouth daily.  Dispense: 90 capsule; Refill: 1  7. Screening for colon cancer  This will be given to the patient when she comes to the lab to have lipid and LFTs drawn. - Fecal occult blood, imunochemical([facility]); Future   Follow Up Instructions: 3-4 mths   I discussed the assessment and treatment plan with the patient. The patient was provided an opportunity to ask questions and all were answered. The patient agreed with the plan and demonstrated an understanding of the instructions.   The patient was advised to call back or seek an in-person evaluation if the symptoms worsen or if the condition fails to improve as anticipated.  I provided 20 minutes of non-face-to-face time during this encounter.   Karle Plumber, MD

## 2019-07-14 ENCOUNTER — Telehealth: Payer: Self-pay | Admitting: Internal Medicine

## 2019-07-14 NOTE — Telephone Encounter (Signed)
Patient was called, answered and stated that she was on break at work and she would call back to reschedule.

## 2019-07-14 NOTE — Telephone Encounter (Signed)
-----   Message from Particia Lather, [place] sent at 07/12/2019 12:24 PM EST ----- needs f/u in 3-4 mths

## 2019-07-18 ENCOUNTER — Ambulatory Visit: Payer: Self-pay | Attending: Internal Medicine

## 2019-07-18 ENCOUNTER — Other Ambulatory Visit: Payer: Self-pay

## 2019-07-18 DIAGNOSIS — Z1211 Encounter for screening for malignant neoplasm of colon: Secondary | ICD-10-CM

## 2019-07-18 DIAGNOSIS — E785 Hyperlipidemia, unspecified: Secondary | ICD-10-CM

## 2019-07-18 DIAGNOSIS — E119 Type 2 diabetes mellitus without complications: Secondary | ICD-10-CM

## 2019-07-18 DIAGNOSIS — E1169 Type 2 diabetes mellitus with other specified complication: Secondary | ICD-10-CM

## 2019-07-19 LAB — LIPID PANEL
Chol/HDL Ratio: 3.3 ratio (ref 0.0–4.4)
Cholesterol, Total: 153 mg/dL (ref 100–199)
HDL: 47 mg/dL (ref >39)
LDL Chol Calc (NIH): 79 mg/dL (ref 0–99)
Triglycerides: 155 mg/dL — ABNORMAL HIGH (ref 0–149)
VLDL Cholesterol Cal: 27 mg/dL (ref 5–40)

## 2019-07-19 LAB — HEPATIC FUNCTION PANEL
ALT: 20 IU/L (ref 0–32)
AST: 15 IU/L (ref 0–40)
Albumin: 4.4 g/dL (ref 3.8–4.9)
Alkaline Phosphatase: 114 IU/L (ref 39–117)
Bilirubin Total: 0.5 mg/dL (ref 0.0–1.2)
Bilirubin, Direct: 0.12 mg/dL (ref 0.00–0.40)
Total Protein: 6.6 g/dL (ref 6.0–8.5)

## 2019-07-19 LAB — HEMOGLOBIN A1C
Est. average glucose Bld gHb Est-mCnc: 174 mg/dL
Hgb A1c MFr Bld: 7.7 % — ABNORMAL HIGH (ref 4.8–5.6)

## 2019-07-26 ENCOUNTER — Telehealth: Payer: Self-pay

## 2019-07-26 NOTE — Telephone Encounter (Signed)
Patient called requesting for lab results. Patient was given lab results, verbalized understanding and had no further questions.

## 2019-07-26 NOTE — Telephone Encounter (Signed)
Patient returning Alexis Mooney call. Please call patient

## 2019-07-26 NOTE — Telephone Encounter (Signed)
Contacted pt to go over lab results pt didn't answer lvm asking pt to give a call back at her earliest convenience  

## 2019-07-28 MED FILL — AMLODIPINE BESYLATE 5 MG TA: 5 | 90 days supply | Qty: 90 | Fill #0

## 2019-07-28 MED FILL — ?ATORVASTATIN 40MG TABLET: 40 | 30 days supply | Qty: 30 | Fill #0

## 2019-07-28 MED FILL — MELOXICAM 15 MG TABLET: 15 | 30 days supply | Qty: 30 | Fill #1

## 2019-08-30 LAB — FECAL OCCULT BLOOD, IMMUNOCHEMICAL: Fecal Occult Bld: NEGATIVE

## 2019-09-05 ENCOUNTER — Telehealth: Payer: Self-pay

## 2019-09-05 NOTE — Telephone Encounter (Signed)
Contacted pt to go over [employer] results pt is aware and doesn't have any questions or concerns

## 2019-09-12 MED FILL — MELOXICAM 15 MG TABLET: 15 | 30 days supply | Qty: 30 | Fill #2

## 2019-09-12 MED FILL — ?ATORVASTATIN 40MG TABLET: 40 | 30 days supply | Qty: 30 | Fill #1

## 2019-09-12 MED FILL — metFORMIN HCL 1000 MG TABS: 1000 | 30 days supply | Qty: 60 | Fill #1

## 2019-09-12 MED FILL — traMADol HCL 50 MG TABS: 50 | 30 days supply | Qty: 60 | Fill #1

## 2019-10-23 ENCOUNTER — Other Ambulatory Visit: Payer: Self-pay | Admitting: Internal Medicine

## 2019-10-23 DIAGNOSIS — M170 Bilateral primary osteoarthritis of knee: Secondary | ICD-10-CM

## 2019-10-23 MED FILL — ?OMEPRAZOLE 20 MG CAP DR: 20 | 30 days supply | Qty: 30 | Fill #0

## 2019-10-23 MED FILL — MELOXICAM 15 MG TABLET: 15 | 30 days supply | Qty: 30 | Fill #1

## 2019-10-23 MED FILL — MELOXICAM 15 MG TABLET: 15 | 30 days supply | Qty: 30 | Fill #0

## 2019-10-23 MED FILL — metFORMIN HCL 1000 MG TABS: 1000 | 30 days supply | Qty: 60 | Fill #2

## 2019-10-23 MED FILL — AMLODIPINE BESYLATE 5 MG TA: 5 | 90 days supply | Qty: 90 | Fill #1

## 2019-10-23 MED FILL — ?ATORVASTATIN 40MG TABLET: 40 | 30 days supply | Qty: 30 | Fill #2

## 2019-10-25 MED FILL — traMADol HCL 50 MG TABS: 50 | 30 days supply | Qty: 60 | Fill #0

## 2019-10-25 NOTE — Telephone Encounter (Signed)
Please contact pt and schedule appointment  

## 2019-11-15 NOTE — Telephone Encounter (Signed)
Attempted to call patient, no answer, Lvm informing patient to return call to office for an appointment for further refills.

## 2020-01-02 ENCOUNTER — Other Ambulatory Visit: Payer: Self-pay | Admitting: Internal Medicine

## 2020-01-02 DIAGNOSIS — I10 Essential (primary) hypertension: Secondary | ICD-10-CM

## 2020-01-02 MED FILL — ?OMEPRAZOLE 20 MG CPDR: 20 | 30 days supply | Qty: 30 | Fill #1

## 2020-01-02 MED FILL — ATORVASTATIN CALCIUM 40 MG: 40 | 30 days supply | Qty: 30 | Fill #3

## 2020-01-02 MED FILL — metFORMIN HCL 1000 MG TABS: 1000 | 30 days supply | Qty: 60 | Fill #3

## 2020-01-02 MED FILL — MELOXICAM 15 MG TABLET: 15 | 30 days supply | Qty: 30 | Fill #2

## 2020-02-14 ENCOUNTER — Other Ambulatory Visit: Payer: Self-pay | Admitting: Internal Medicine

## 2020-02-14 DIAGNOSIS — M170 Bilateral primary osteoarthritis of knee: Secondary | ICD-10-CM

## 2020-02-14 MED FILL — metFORMIN HCL 1000 MG TABS: 1000 | 30 days supply | Qty: 60 | Fill #4

## 2020-02-14 MED FILL — AMLODIPINE BESYLATE 5 MG TA: 5 | 30 days supply | Qty: 30 | Fill #0

## 2020-02-14 MED FILL — ?OMEPRAZOLE 20 MG CPDR: 20 | 30 days supply | Qty: 30 | Fill #2

## 2020-02-14 MED FILL — MELOXICAM 15 MG TABLET: 15 | 30 days supply | Qty: 30 | Fill #3

## 2020-02-14 MED FILL — ATORVASTATIN CALCIUM 40 MG: 40 | 30 days supply | Qty: 30 | Fill #4

## 2020-02-14 NOTE — Telephone Encounter (Signed)
Requested medication (s) are due for refill today: no  Requested medication (s) are on the active medication list: yes   Last refill:  10/25/2019  Future visit scheduled: no  Notes to clinic:  this refill cannot be delegated    Requested Prescriptions  Pending Prescriptions Disp Refills   traMADol (ULTRAM) 50 MG tablet [Pharmacy Med Name: traMADol HCL 50 MG TABS 50 Tablet] 60 tablet 0    Sig: TAKE 1 TABLET (50 MG TOTAL) BY MOUTH EVERY 12 (TWELVE) HOURS AS NEEDED.      Not Delegated - Analgesics:  Opioid Agonists Failed - 02/14/2020  9:43 AM      Failed - This refill cannot be delegated      Failed - Urine Drug Screen completed in last 360 days.      Failed - Valid encounter within last 6 months    Recent Outpatient Visits           7 months ago Primary osteoarthritis of both knees   [facility] Marcine Matar, MD   1 year ago Medication management   [facility] Lois Huxley, Cornelius Moras, [name]   1 year ago Primary osteoarthritis of both knees   [facility] Storm Frisk, MD   1 year ago Essential hypertension   [facility] Claiborne Rigg, NP   1 year ago Chest pain, unspecified type   [facility] Marcine Matar, MD

## 2020-03-20 ENCOUNTER — Other Ambulatory Visit: Payer: Self-pay | Admitting: Internal Medicine

## 2020-03-20 DIAGNOSIS — I10 Essential (primary) hypertension: Secondary | ICD-10-CM

## 2020-03-20 MED FILL — ?AMLODIPINE BESYLATE 5MG TA: 5 | 30 days supply | Qty: 30 | Fill #0

## 2020-03-20 MED FILL — OMEPRAZOLE 20 MG CAP: 20 | 30 days supply | Qty: 30 | Fill #3

## 2020-03-20 MED FILL — ATORVASTATIN CALCIUM 40 MG: 40 | 30 days supply | Qty: 30 | Fill #5

## 2020-03-20 MED FILL — MELOXICAM 15 MG TABLET: 15 | 30 days supply | Qty: 30 | Fill #4

## 2020-03-20 MED FILL — METFORMIN HCL 1000 MG TABS: 1000 | 30 days supply | Qty: 60 | Fill #5

## 2020-03-20 NOTE — Telephone Encounter (Signed)
Attempted to call patient to schedule appointment- left message to call office.(interpreter# [phone]) Courtesy RF given #30.

## 2020-03-26 ENCOUNTER — Other Ambulatory Visit: Payer: Self-pay | Admitting: Obstetrics and Gynecology

## 2020-03-26 DIAGNOSIS — Z1231 Encounter for screening mammogram for malignant neoplasm of breast: Secondary | ICD-10-CM

## 2020-05-07 ENCOUNTER — Other Ambulatory Visit: Payer: Self-pay | Admitting: Internal Medicine

## 2020-05-07 DIAGNOSIS — E1169 Type 2 diabetes mellitus with other specified complication: Secondary | ICD-10-CM

## 2020-05-07 DIAGNOSIS — I10 Essential (primary) hypertension: Secondary | ICD-10-CM

## 2020-05-07 DIAGNOSIS — E785 Hyperlipidemia, unspecified: Secondary | ICD-10-CM

## 2020-05-07 MED FILL — MELOXICAM 15 MG TABLET: 15 | 30 days supply | Qty: 30 | Fill #5

## 2020-05-07 MED FILL — OMEPRAZOLE 20 MG CAP: 20 | 30 days supply | Qty: 30 | Fill #4

## 2020-05-07 MED FILL — ATORVASTATIN CALCIUM 40 MG: 40 | 30 days supply | Qty: 30 | Fill #0

## 2020-05-07 MED FILL — AMLODIPINE BESYLATE 5 MG TA: 5 | 17 days supply | Qty: 17 | Fill #0

## 2020-05-07 MED FILL — METFORMIN HCL 1000 MG TABS: 1000 | 30 days supply | Qty: 60 | Fill #6

## 2020-05-07 NOTE — Telephone Encounter (Signed)
Requested Prescriptions  Pending Prescriptions Disp Refills  . amLODipine (NORVASC) 5 MG tablet [Pharmacy Med Name: AMLODIPINE BESYLATE 5MG  TA 5 Tablet] 16 tablet 0    Sig: TAKE 1 TABLET (5 MG TOTAL) BY MOUTH DAILY.     Cardiovascular:  Calcium Channel Blockers Failed - 05/07/2020 11:57 AM      Failed - Last BP in normal range    BP Readings from Last 1 Encounters:  04/25/19 (!) 123/91         Failed - Valid encounter within last 6 months    Recent Outpatient Visits          10 months ago Primary osteoarthritis of both knees   [facility] 04/27/19, MD   1 year ago Medication management   [facility], Lois Huxley, [name]   1 year ago Primary osteoarthritis of both knees   [facility] Cornelius Moras, MD   1 year ago Essential hypertension   [facility] Storm Frisk, NP   1 year ago Chest pain, unspecified type   [facility], MD      Future Appointments            In 2 weeks Marcine Matar, MD [facility]           patient has scheduled appointment 05/23/20. Message is that she is out of medication. Gave #[phone]  0 refills to supply until appointment.

## 2020-05-23 ENCOUNTER — Other Ambulatory Visit: Payer: Self-pay | Admitting: Internal Medicine

## 2020-05-23 ENCOUNTER — Encounter: Payer: Self-pay | Admitting: Internal Medicine

## 2020-05-23 ENCOUNTER — Ambulatory Visit: Payer: Self-pay | Attending: Internal Medicine | Admitting: Internal Medicine

## 2020-05-23 ENCOUNTER — Other Ambulatory Visit: Payer: Self-pay

## 2020-05-23 VITALS — BP 113/77 | HR 81 | Temp 98.2°F | Resp 16 | Wt 231.0 lb

## 2020-05-23 DIAGNOSIS — M21619 Bunion of unspecified foot: Secondary | ICD-10-CM

## 2020-05-23 DIAGNOSIS — I10 Essential (primary) hypertension: Secondary | ICD-10-CM

## 2020-05-23 DIAGNOSIS — E785 Hyperlipidemia, unspecified: Secondary | ICD-10-CM

## 2020-05-23 DIAGNOSIS — Z6841 Body Mass Index (BMI) 40.0 and over, adult: Secondary | ICD-10-CM

## 2020-05-23 DIAGNOSIS — E1169 Type 2 diabetes mellitus with other specified complication: Secondary | ICD-10-CM

## 2020-05-23 DIAGNOSIS — L309 Dermatitis, unspecified: Secondary | ICD-10-CM

## 2020-05-23 DIAGNOSIS — E119 Type 2 diabetes mellitus without complications: Secondary | ICD-10-CM

## 2020-05-23 DIAGNOSIS — L84 Corns and callosities: Secondary | ICD-10-CM

## 2020-05-23 DIAGNOSIS — Z2821 Immunization not carried out because of patient refusal: Secondary | ICD-10-CM | POA: Insufficient documentation

## 2020-05-23 DIAGNOSIS — M170 Bilateral primary osteoarthritis of knee: Secondary | ICD-10-CM

## 2020-05-23 LAB — POCT GLYCOSYLATED HEMOGLOBIN (HGB A1C): HbA1c, POC (controlled diabetic range): 8.5 % — AB (ref 0.0–7.0)

## 2020-05-23 LAB — GLUCOSE, POCT (MANUAL RESULT ENTRY): POC Glucose: 183 mg/dl — AB (ref 70–99)

## 2020-05-23 MED ORDER — TERBINAFINE HCL 1 % EX CREA: 1.0000 "application " | TOPICAL_CREAM | Freq: Two times a day (BID) | CUTANEOUS | 0 refills | Status: DC

## 2020-05-23 MED ORDER — AMLODIPINE BESYLATE 5 MG PO TABS: 5.0000 mg | ORAL_TABLET | Freq: Every day | ORAL | 6 refills | Status: DC

## 2020-05-23 MED ORDER — METFORMIN HCL 1000 MG PO TABS: 1000.0000 mg | ORAL_TABLET | Freq: Two times a day (BID) | ORAL | 6 refills | Status: DC

## 2020-05-23 MED ORDER — TRAMADOL HCL 50 MG PO TABS: 50.0000 mg | ORAL_TABLET | Freq: Two times a day (BID) | ORAL | 0 refills | Status: DC | PRN

## 2020-05-23 MED ORDER — MELOXICAM 15 MG PO TABS: 15.0000 mg | ORAL_TABLET | Freq: Every day | ORAL | 1 refills | Status: DC

## 2020-05-23 MED ORDER — ATORVASTATIN CALCIUM 40 MG PO TABS: 40.0000 mg | ORAL_TABLET | Freq: Every day | ORAL | 6 refills | Status: DC

## 2020-05-23 MED FILL — AMLODIPINE BESYLATE 5 MG TA: 5 | 30 days supply | Qty: 30 | Fill #0

## 2020-05-23 MED FILL — traMADol HCL 50 MG TABS: 50 | 30 days supply | Qty: 60 | Fill #0

## 2020-05-23 NOTE — Progress Notes (Signed)
Patient ID: Alexis Mooney, female    DOB: Jul 07, 1965  MRN: [id number]  CC: Medication Refill   Subjective: Alexis Mooney is a 54 y.o. female who presents for chronic ds management.  Last evaluated by me in February of this year. Her concerns today include:  History of HTN,morbidobesity, osteoarthritis of the knees,hyperlipidemia, DM.   OA knees:  Some days worse than others.  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  Has to stand excepts when on breaks.  Sometimes she goes to restroom to rest knees.  -takes Meloxicam about 3 times a wk.  Out of Tramadol.  Tries not to take meds too often; only when the pain is real bad. -reports it is hard for her to come to appts often.  Has to use [facility] to get her and does not have insurance.  Had OC in past.  Afraid that her income may be too much but her income varies and is seasonal and may not qualify for OC during her peak work season.  HTN:  Compliant with Norvasc but did not take as yet for today.  She limits salt in the foods.  No chest pains or shortness of breath.  No lower extremity edema.  DIABETES TYPE 2/Obesity Last A1C:   Results for orders placed or performed in visit on 05/23/20  POCT glucose (manual entry)  Result Value Ref Range   POC Glucose 183 (A) 70 - 99 mg/dl  POCT glycosylated hemoglobin (Hb A1C)  Result Value Ref Range   Hemoglobin A1C     HbA1c POC (<> result, manual entry)     HbA1c, POC (prediabetic range)     HbA1c, POC (controlled diabetic range) 8.5 (A) 0.0 - 7.0 %    Med Adherence:  Taking Metformin 1 gram once a day.  Not taking BID because her friend told her that 1gram BID was too much Medication side effects:  []  Yes    [x]  No Home Monitoring?  []  Yes    [x]  No but does have monitor Home glucose results range: Diet Adherence: [x]  Yes -does not eat fast foods, not much rice Exercise: Not much other than the standing that  she does at work. Hypoglycemic episodes?: []  Yes    []  No Numbness of the feet? []  Yes    []  No Retinopathy hx? []  Yes    []  No Last eye exam: Overdue for eye exam but has limited finances at this time.  Denies any blurred vision. Comments:   HL:  Tolerating Lipitor  Rash around neck for mths.  Does not itch.  Uses perfume.  HM:  Declines flu and Pneumovax.  MMG appt scheduled for 06/13/2020  Patient Active Problem List   Diagnosis Date Noted  . Influenza vaccine refused 05/23/2020  . Hyperlipidemia associated with type 2 diabetes mellitus ([place]) 07/11/2019  . Type 2 diabetes mellitus without complication, without long-term current use of insulin ([place]) 07/11/2019  . Screening breast examination 04/25/2019  . Axillary lump, left 04/25/2019  . Frequency of urination 01/31/2019  . Dysuria 01/31/2019  . Epigastric pain 01/31/2019  . Primary osteoarthritis of both knees 07/06/2018  . Memory disorder 02/04/2018  . Anxiety and depression 03/19/2017  . Hair loss 03/19/2017  . High triglycerides 12/29/2016  . Morbid obesity with BMI of 40.0-44.9, adult ([place]) 10/19/2016  . Perimenopause 01/22/2014  . Essential hypertension 01/22/2014     Current Outpatient Medications on  File Prior to Visit  Medication Sig Dispense Refill  . omeprazole (PRILOSEC) 20 MG capsule Take 1 capsule (20 mg total) by mouth daily. 90 capsule 1  . Blood Glucose Monitoring Suppl (TRUE METRIX METER) w/Device KIT Use as instructed. Check blood glucose level by fingerstick twice per day. 1 kit 0  . glucose blood (TRUE METRIX BLOOD GLUCOSE TEST) test strip Use as instructed. Check blood glucose level by fingerstick twice per day. 100 each 12  . hydrocortisone 2.5 % cream Apply topically daily. For 4 days 30 g 0  . loratadine (CLARITIN) 10 MG tablet Take 1 tablet (10 mg total) by mouth daily. 30 tablet 11  . TRUEplus Lancets 28G MISC Use as instructed. Check blood glucose level by fingerstick twice per day. 100 each 3    No current facility-administered medications on file prior to visit.    Allergies  Allergen Reactions  . Ampicillin Hives  . Bactrim [Sulfamethoxazole-Trimethoprim] Swelling    Social History   Socioeconomic History  . Marital status: Single    Spouse name: Not on file  . Number of children: Not on file  . Years of education: Not on file  . Highest education level: Some college, no degree  Occupational History  . Not on file  Tobacco Use  . Smoking status: Former Smoker    Quit date: 09/24/1994    Years since quitting: 25.6  . Smokeless tobacco: Never Used  Vaping Use  . Vaping Use: Never used  Substance and Sexual Activity  . Alcohol use: Yes    Comment: rarely  . Drug use: No  . Sexual activity: Yes    Birth control/protection: None  Other Topics Concern  . Not on file  Social History Narrative  . Not on file   Social Determinants of Health   Financial Resource Strain: Not on file  Food Insecurity: Not on file  Transportation Needs: Not on file  Physical Activity: Not on file  Stress: Not on file  Social Connections: Not on file  Intimate Partner Violence: Not on file    Family History  Problem Relation Age of Onset  . Cancer Sister 40       stomach  . Hypertension Mother   . Hypertension Father   . Breast cancer Cousin   . Hypertension Maternal Grandmother   . Hypertension Maternal Grandfather   . Breast cancer Paternal Aunt     Past Surgical History:  Procedure Laterality Date  . ABSCESS DRAINAGE    . BREAST BIOPSY Left    [place] Core bx, 2010?, pt not sure when    ROS: Review of Systems Negative except as stated above  PHYSICAL EXAM: BP 113/77   Pulse 81   Temp 98.2 F (36.8 C)   Resp 16   Wt 231 lb (104.8 kg)   SpO2 97%   BMI 43.65 kg/m   Wt Readings from Last 3 Encounters:  05/23/20 231 lb (104.8 kg)  04/25/19 224 lb 8 oz (101.8 kg)  02/10/19 234 lb (106.1 kg)    Physical Exam  General appearance - alert, well appearing,  obese middle-aged Hispanic female and in no distress Mental status - normal mood, behavior, speech, dress, motor activity, and thought processes Eyes - pupils equal and reactive, extraocular eye movements intact Neck - supple, no significant adenopathy Chest - clear to auscultation, no wheezes, rales or rhonchi, symmetric air entry Heart - normal rate, regular rhythm, normal S1, S2, no murmurs, rubs, clicks or gallops Extremities -  peripheral pulses normal, no pedal edema, no clubbing or cyanosis MSK knees: Moderate joint enlargement.  Mild crepitus and discomfort with passive range of motion. Skin: Small macular slightly hyperpigmented patches noted on the right side of the neck about 3 of them there and 3 on the posterior neck.  Borders appear scabbed. Diabetic Foot Exam - Simple   Simple Foot Form Visual Inspection See comments: Yes Sensation Testing Intact to touch and monofilament testing bilaterally: Yes Pulse Check Posterior Tibialis and Dorsalis pulse intact bilaterally: Yes Comments Moderate-sized but noninflamed bunions on both big toes.  2 cm callus on the medial aspect but plantar surface of the right big toe.     Depression screen [facility] 2/9 05/23/2020 07/11/2019 11/29/2018  Decreased Interest 0 0 0  Down, Depressed, Hopeless 0 0 1  PHQ - 2 Score 0 0 1  Altered sleeping - - 0  Tired, decreased energy - - 1  Change in appetite - - 0  Feeling bad or failure about yourself  - - 0  Trouble concentrating - - 0  Moving slowly or fidgety/restless - - 0  Suicidal thoughts - - 0  PHQ-9 Score - - 2     CMP Latest Ref Rng & Units 07/18/2019 11/30/2018 03/19/2017  Glucose 65 - 99 mg/dL - 780(B) 99  BUN 6 - 24 mg/dL - 11 7  Creatinine 0.81 - 1.00 mg/dL - 0.65(Z) 9.90(U)  Sodium 134 - 144 mmol/L - 139 139  Potassium 3.5 - 5.2 mmol/L - 4.3 4.2  Chloride 96 - 106 mmol/L - 102 98  CO2 20 - 29 mmol/L - 22 23  Calcium 8.7 - 10.2 mg/dL - 9.5 9.4  Total Protein 6.0 - 8.5 g/dL 6.6 6.8 7.2   Total Bilirubin 0.0 - 1.2 mg/dL 0.5 0.5 0.4  Alkaline Phos 39 - 117 IU/L 114 82 77  AST 0 - 40 IU/L 15 33 19  ALT 0 - 32 IU/L 20 40(H) 22   Lipid Panel     Component Value Date/Time   CHOL 153 07/18/2019 0838   TRIG 155 (H) 07/18/2019 0838   TRIG 162 (H) 12/11/2015 0906   HDL 47 07/18/2019 0838   CHOLHDL 3.3 07/18/2019 0838   CHOLHDL 4.8 09/25/2014 0912   VLDL 56 (H) 09/25/2014 0912   LDLCALC 79 07/18/2019 0838    CBC    Component Value Date/Time   WBC 6.0 11/30/2018 0924   WBC 6.6 03/18/2010 2119   RBC 4.98 11/30/2018 0924   RBC 4.67 03/18/2010 2119   HGB 14.3 11/30/2018 0924   HCT 42.6 11/30/2018 0924   PLT 288 11/30/2018 0924   MCV 86 11/30/2018 0924   MCH 28.7 11/30/2018 0924   MCH 30.1 12/08/2009 1329   MCHC 33.6 11/30/2018 0924   MCHC 31.7 03/18/2010 2119   RDW 13.0 11/30/2018 0924   LYMPHSABS 2.6 03/18/2010 2119   MONOABS 0.5 03/18/2010 2119   EOSABS 0.2 03/18/2010 2119   BASOSABS 0.0 03/18/2010 2119    ASSESSMENT AND PLAN: 1. Type 2 diabetes mellitus without complication, without long-term current use of insulin (HCC) Not at goal. Encourage patient to take Metformin twice a day as prescribed.  As long as the medication is not causing any diarrhea or other significant side effects for her which she denies. Discussed and encourage healthy eating habits and try to move as much as she can. - POCT glucose (manual entry) - POCT glycosylated hemoglobin (Hb A1C) - Microalbumin / creatinine urine ratio - metFORMIN (GLUCOPHAGE)  1000 MG tablet; Take 1 tablet (1,000 mg total) by mouth 2 (two) times daily with a meal.  Dispense: 180 tablet; Refill: 6 - CBC - Comprehensive metabolic panel  2. Essential hypertension At goal.  Continue current medication and low-salt diet - amLODipine (NORVASC) 5 MG tablet; Take 1 tablet (5 mg total) by mouth daily.  Dispense: 30 tablet; Refill: 6  3. Primary osteoarthritis of both knees Strongly encourage weight loss.  Refill  meloxicam.  Refill tramadol with the understanding that she has to follow-up at least every 3 to 4 months to continue to be prescribed the medication.  [place] controlled substance reporting system reviewed.  We have updated her controlled substance prescribing agreement. - meloxicam (MOBIC) 15 MG tablet; Take 1 tablet (15 mg total) by mouth daily.  Dispense: 90 tablet; Refill: 1 - traMADol (ULTRAM) 50 MG tablet; Take 1 tablet (50 mg total) by mouth every 12 (twelve) hours as needed.  Dispense: 60 tablet; Refill: 0  4. Hyperlipidemia associated with type 2 diabetes mellitus (HCC) - atorvastatin (LIPITOR) 40 MG tablet; Take 1 tablet (40 mg total) by mouth daily.  Dispense: 30 tablet; Refill: 6  5. Influenza vaccine refused Recommended.  Patient declined.  6. Morbid obesity with BMI of 40.0-44.9, adult ([place]) See #1 above  7. Pre-ulcerative corn or callous 8. Bunion of great toe Discussed good diabetic foot care. Advised to apply for the orange card/cone discount.  Once approved we can refer her to podiatry  9. Dermatitis Questionably fungal.  Advised to stop using the perfume on her neck. - terbinafine (LAMISIL AT) 1 % cream; Apply 1 application topically 2 (two) times daily.  Dispense: 30 g; Refill: 0  11. 23-polyvalent pneumococcal polysaccharide vaccine declined   Patient was given the opportunity to ask questions.  Patient verbalized understanding of the plan and was able to repeat key elements of the plan.   Orders Placed This Encounter  Procedures  . Microalbumin / creatinine urine ratio  . CBC  . Comprehensive metabolic panel  . POCT glucose (manual entry)  . POCT glycosylated hemoglobin (Hb A1C)     Requested Prescriptions   Signed Prescriptions Disp Refills  . amLODipine (NORVASC) 5 MG tablet 30 tablet 6    Sig: Take 1 tablet (5 mg total) by mouth daily.  . metFORMIN (GLUCOPHAGE) 1000 MG tablet 180 tablet 6    Sig: Take 1 tablet (1,000 mg total) by mouth 2  (two) times daily with a meal.  . meloxicam (MOBIC) 15 MG tablet 90 tablet 1    Sig: Take 1 tablet (15 mg total) by mouth daily.  Marland Kitchen atorvastatin (LIPITOR) 40 MG tablet 30 tablet 6    Sig: Take 1 tablet (40 mg total) by mouth daily.  . traMADol (ULTRAM) 50 MG tablet 60 tablet 0    Sig: Take 1 tablet (50 mg total) by mouth every 12 (twelve) hours as needed.  . terbinafine (LAMISIL AT) 1 % cream 30 g 0    Sig: Apply 1 application topically 2 (two) times daily.    Return in about 4 months (around 09/21/2020).  Karle Plumber, MD, FACP

## 2020-05-23 NOTE — Patient Instructions (Signed)
Please start taking the Metformin 1000 mg twice a day as prescribed.  Diabetes mellitus y nutricin, en adultos Diabetes Mellitus and Nutrition, Adult Si sufre de diabetes (diabetes mellitus), es muy importante tener hbitos alimenticios saludables debido a que sus niveles de Psychologist, counselling sangre (glucosa) se ven afectados en gran medida por lo que come y bebe. [place] alimentos saludables en las cantidades [place], aproximadamente a la [employer], [place] ayudar a:  Scientist, physiological glucemia.  Disminuir el riesgo de sufrir una enfermedad cardaca.  Mejorar la presin arterial.  Barista o mantener un peso saludable. Todas las personas que sufren de diabetes son diferentes y cada una tiene necesidades diferentes en cuanto a un plan de alimentacin. El mdico puede recomendarle que trabaje con un especialista en dietas y nutricin (nutricionista) para Tax adviser plan para usted. Su plan de alimentacin puede variar segn factores como:  Las caloras que necesita.  Los medicamentos que toma.  Su peso.  Sus niveles de glucemia, presin arterial y colesterol.  Su nivel de [place].  Otras afecciones que tenga, como enfermedades cardacas o renales. Cmo me afectan los carbohidratos? Los carbohidratos, o hidratos de carbono, afectan su nivel de glucemia ms que cualquier otro tipo de alimento. La ingesta de carbohidratos naturalmente aumenta la cantidad de [employer]. El recuento de carbohidratos es un mtodo destinado a Midwife un registro de la cantidad de carbohidratos que se consumen. El recuento de carbohidratos es importante para Pharmacologist la glucemia a un nivel saludable, especialmente si utiliza insulina o toma determinados medicamentos por va oral para la diabetes. Es importante conocer la cantidad de carbohidratos que se pueden ingerir en cada comida sin correr Surveyor, minerals. Esto es Government social research officer. Su nutricionista puede ayudarlo a calcular la  cantidad de carbohidratos que debe ingerir en cada comida y en cada refrigerio. Entre los alimentos que contienen carbohidratos, se incluyen:  Pan, cereal, arroz, pastas y galletas.  Papas y maz.  Guisantes, frijoles y lentejas.  Leche y Dentist.  Nils Pyle y [place].  Postres, como pasteles, galletas, helado y caramelos. Cmo me afecta el alcohol? El alcohol puede provocar disminuciones sbitas de la glucemia (hipoglucemia), especialmente si utiliza insulina o toma determinados medicamentos por va oral para la diabetes. La hipoglucemia es una afeccin potencialmente mortal. Los sntomas de la hipoglucemia (somnolencia, mareos y confusin) son similares a los sntomas de haber consumido demasiado alcohol. Si el mdico afirma que el alcohol es seguro para usted, [place] estas pautas:  Limite el consumo de alcohol a no ms de por da si es mujer y no est [place], y a si es hombre. Una medida equivale a 12oz ( ) de cerveza, 5oz ( ) de vino o 1oz (32ml) de bebidas alcohlicas de alta graduacin.  No beba con el estmago vaco.  Mantngase hidratado bebiendo agua, refrescos dietticos o t helado sin azcar.  Tenga en cuenta que los refrescos comunes, los jugos y otras bebida para Engineer, manufacturing pueden contener mucha azcar y se deben contar como carbohidratos. Cules son algunos consejos para seguir este plan?  Leer las etiquetas de los alimentos  Comience por leer el tamao de la porcin en la "Informacin nutricional" en las etiquetas de los alimentos envasados y las bebidas. La cantidad de caloras, carbohidratos, grasas y otros nutrientes mencionados en la etiqueta se basan en una porcin del alimento. Muchos alimentos contienen ms de una porcin por envase.  Verifique la cantidad total de gramos (g) de carbohidratos totales en  una porcin. Puede calcular la cantidad de porciones de carbohidratos al dividir el total de carbohidratos por 15. Por ejemplo, si un  alimento tiene un total de 30g de carbohidratos, equivale a 2 porciones de carbohidratos.  Verifique la cantidad de gramos (g) de grasas saturadas y grasas trans en una porcin. Escoja alimentos que no contengan grasa o que tengan un bajo contenido.  Verifique la cantidad de miligramos (mg) de sal (sodio) en una porcin. La mayora de las personas deben limitar la ingesta de sodio total a menos de 2300mg  por .  Siempre consulte la informacin nutricional de los alimentos etiquetados como "con bajo contenido de grasa" o "sin grasa". Estos alimentos pueden tener un mayor contenido de Futures trader agregada o carbohidratos refinados, y deben evitarse.  Hable con su nutricionista para identificar sus objetivos diarios en cuanto a los nutrientes mencionados en la etiqueta. Al ir de compras  Evite comprar alimentos procesados, enlatados o precocinados. Estos alimentos tienden a International aid/development worker mayor cantidad de [place], sodio y azcar agregada.  Compre en la zona exterior de la tienda de comestibles. Esta zona incluye frutas y verduras frescas, granos a granel, carnes frescas y productos lcteos frescos. Al cocinar  Utilice mtodos de coccin a baja temperatura, como hornear, en lugar de mtodos de coccin a alta temperatura, como frer en abundante aceite.  Cocine con aceites saludables, como el aceite de [place], canola o [place].  Evite cocinar con manteca, crema o carnes con alto contenido de grasa. Planificacin de las comidas  Coma las comidas y los refrigerios regularmente, preferentemente a la misma hora todos [place]. Evite pasar largos perodos de tiempo sin comer.  Consuma alimentos ricos en fibra, como frutas frescas, verduras, frijoles y cereales integrales. Consulte a su nutricionista sobre cuntas porciones de carbohidratos puede consumir en cada comida.  Consuma entre 4 y 6 onzas (oz) de protenas magras por da, como carnes [place], pollo, pescado, huevos o tofu. Una onza de protena magra  equivale a: ? 1 onza de carne, pollo o pescado. ? 1huevo. ?  taza de tofu.  Coma algunos alimentos por da que contengan grasas saludables, como aguacates, frutos secos, semillas y pescado. Estilo de vida  Controle su nivel de glucemia con regularidad.  Haga actividad fsica habitualmente como se lo haya indicado el mdico. Esto puede incluir lo siguiente: ? [place] semanales de ejercicio de intensidad moderada o alta. Esto podra incluir caminatas dinmicas, ciclismo o gimnasia acutica. ? Realizar ejercicios de elongacin y de fortalecimiento, como yoga o levantamiento de pesas, por lo menos 2veces por semana.  Tome los se lo haya indicado el mdico.  No consuma ningn producto que contenga nicotina o tabaco, como cigarrillos y [employer]. Si necesita ayuda para dejar de fumar, consulte al Administrator, Civil Service con un asesor o instructor en diabetes para identificar estrategias para controlar el estrs y cualquier desafo emocional y social. Preguntas para hacerle al mdico  Es necesario que consulte a CIGNA en el cuidado de la diabetes?  Es necesario que me rena con un nutricionista?  A qu nmero puedo llamar si tengo preguntas?  Cules son los mejores momentos para controlar la glucemia? Dnde encontrar ms informacin:  Asociacin Estadounidense de la Diabetes (American Diabetes Association): diabetes.org  Academia de Nutricin y IT trainer (Academy of Nutrition and Dietetics): www.eatright.org  Pension scheme manager de la Diabetes y las Enfermedades Digestivas y [place] [facility] of Diabetes and Digestive and Kidney Diseases, NIH): [facility] Resumen  Un plan de alimentacin saludable  lo ayudar a Scientist, physiological glucemia y Pharmacologist un estilo de vida saludable.  Trabajar con un especialista en dietas y nutricin (nutricionista) puede ayudarlo a Designer, television/film set de alimentacin para usted.  Tenga en cuenta  que los carbohidratos (hidratos de carbono) y el alcohol tienen efectos inmediatos en sus niveles de glucemia. Es importante contar los carbohidratos que ingiere y consumir alcohol con prudencia. Esta informacin no tiene Theme park manager el consejo del mdico. Asegrese de hacerle al mdico cualquier pregunta que tenga. Document Revised: 02/02/2017 Document Reviewed: 09/14/2016 Elsevier Patient Education  2020 [employer].

## 2020-05-24 LAB — CBC
Hematocrit: 41.5 % (ref 34.0–46.6)
Hemoglobin: 13.8 g/dL (ref 11.1–15.9)
MCH: 28.5 pg (ref 26.6–33.0)
MCHC: 33.3 g/dL (ref 31.5–35.7)
MCV: 86 fL (ref 79–97)
Platelets: 305 10*3/uL (ref 150–450)
RBC: 4.84 x10E6/uL (ref 3.77–5.28)
RDW: 13 % (ref 11.7–15.4)
WBC: 7.2 10*3/uL (ref 3.4–10.8)

## 2020-05-24 LAB — MICROALBUMIN / CREATININE URINE RATIO
Creatinine, Urine: 101.2 mg/dL
Microalb/Creat Ratio: 25 mg/g creat (ref 0–29)
Microalbumin, Urine: 25.8 ug/mL

## 2020-05-24 LAB — COMPREHENSIVE METABOLIC PANEL
ALT: 19 IU/L (ref 0–32)
AST: 13 IU/L (ref 0–40)
Albumin/Globulin Ratio: 1.8 (ref 1.2–2.2)
Albumin: 4.4 g/dL (ref 3.8–4.9)
Alkaline Phosphatase: 87 IU/L (ref 44–121)
BUN/Creatinine Ratio: 16 (ref 9–23)
BUN: 8 mg/dL (ref 6–24)
Bilirubin Total: 0.3 mg/dL (ref 0.0–1.2)
CO2: 24 mmol/L (ref 20–29)
Calcium: 9.2 mg/dL (ref 8.7–10.2)
Chloride: 101 mmol/L (ref 96–106)
Creatinine, Ser: 0.51 mg/dL — ABNORMAL LOW (ref 0.57–1.00)
GFR calc Af Amer: 126 mL/min/{1.73_m2} (ref >59)
GFR calc non Af Amer: 109 mL/min/{1.73_m2} (ref >59)
Globulin, Total: 2.4 g/dL (ref 1.5–4.5)
Glucose: 166 mg/dL — ABNORMAL HIGH (ref 65–99)
Potassium: 4.1 mmol/L (ref 3.5–5.2)
Sodium: 139 mmol/L (ref 134–144)
Total Protein: 6.8 g/dL (ref 6.0–8.5)

## 2020-05-28 ENCOUNTER — Ambulatory Visit: Payer: No Typology Code available for payment source

## 2020-06-13 ENCOUNTER — Ambulatory Visit: Payer: Self-pay | Admitting: *Deleted

## 2020-06-13 ENCOUNTER — Ambulatory Visit
Admission: RE | Admit: 2020-06-13 | Discharge: 2020-06-13 | Disposition: A | Discharge status: Home / Self Care | Payer: No Typology Code available for payment source | Source: Ambulatory Visit | Attending: Obstetrics and Gynecology | Admitting: Obstetrics and Gynecology

## 2020-06-13 ENCOUNTER — Other Ambulatory Visit: Payer: Self-pay

## 2020-06-13 VITALS — BP 132/92 | Wt 222.6 lb

## 2020-06-13 DIAGNOSIS — Z1231 Encounter for screening mammogram for malignant neoplasm of breast: Secondary | ICD-10-CM

## 2020-06-13 DIAGNOSIS — Z1239 Encounter for other screening for malignant neoplasm of breast: Secondary | ICD-10-CM

## 2020-06-13 NOTE — Progress Notes (Signed)
[name] is a 55 y.o. female who presents to [facility] clinic today with complaint of left axillary and right breast pain one week ago that happened briefly. Patient denied any breast or axillary pain today.    Pap Smear: Pap smear not completed today. Last Pap smear was 03/08/2018 at [facility] and normal with negative HPV. Per patient has no history of an abnormal Pap smear. Last two Pap smear results are in Epic.   Physical exam: Breasts Breasts symmetrical. No skin abnormalities bilateral breasts. No nipple retraction bilateral breasts. No nipple discharge bilateral breasts. No lymphadenopathy. No lumps palpated bilateral breasts. No complaints of pain or tenderness on exam.       Pelvic/Bimanual Pap is not indicated today per BCCCP guidelines.   Smoking History: Patient is former smoker that quit in 1996.   Patient Navigation: Patient education provided. Access to services provided for patient through BCCCP program.   Colorectal Cancer Screening: Per patient had a colonoscopy completed 26 years ago. FIT Test completed 03/14/2018 and negative. No complaints today.   Breast and Cervical Cancer Risk Assessment: Patient has a family history of her paternal aunt having breast cancer. Patient has no known genetic mutations or history of radiation treatment to the chest before age 50. Patient does not have history of cervical dysplasia, immunocompromised, or DES exposure in-utero.  Risk Assessment    Risk Scores      06/13/2020 04/25/2019   Last edited by: Meryl Dare, CMA Estera Ozier, Carlye Grippe, RN   5-year risk: 0.9 % 0.9 %   Lifetime risk: 7 % 7.1 %          A: BCCCP exam without pap smear Complaint left axillary and right breast pain around a week ago that occurred briefy.  P: Referred patient to the [facility] for a screening mammogram on the mobile unit. Appointment scheduled Thursday, June 13, 2020 at 1000.  Priscille Heidelberg,  RN 06/13/2020 10:12 AM

## 2020-06-13 NOTE — Patient Instructions (Signed)
Explained breast self awareness with [name]. Patient did not need a Pap smear today due to last Pap smear and HPV typing was 03/08/2018. Let her know BCCCP will cover Pap smears and HPV typing every 5 years unless has a history of abnormal Pap smears. Referred patient to the [facility] for a screening mammogram on the mobile unit. Appointment scheduled Thursday, June 13, 2020 at 1000. Patient escorted to the mobile unit for her screening mammogram following her BCCCP appointment. Let patient know the Breast Center will follow up with her within the next couple weeks with results of her mammogram by letter or phone. Alexis Mooney verbalized understanding.  [place], Kathaleen Maser, RN 10:12 AM

## 2020-06-17 ENCOUNTER — Other Ambulatory Visit: Payer: Self-pay | Admitting: Obstetrics and Gynecology

## 2020-06-17 DIAGNOSIS — R928 Other abnormal and inconclusive findings on diagnostic imaging of breast: Secondary | ICD-10-CM

## 2020-07-04 ENCOUNTER — Other Ambulatory Visit: Payer: Self-pay | Admitting: Internal Medicine

## 2020-07-04 DIAGNOSIS — M170 Bilateral primary osteoarthritis of knee: Secondary | ICD-10-CM

## 2020-07-04 MED FILL — ATORVASTATIN CALCIUM 40 MG: 40 | 30 days supply | Qty: 30 | Fill #1

## 2020-07-04 MED FILL — METFORMIN HCL 1000 MG TABS: 1000 | 30 days supply | Qty: 60 | Fill #7

## 2020-07-04 MED FILL — MELOXICAM 15 MG TABLET: 15 | 30 days supply | Qty: 30 | Fill #0

## 2020-07-04 MED FILL — AMLODIPINE BESYLATE 5 MG TA: 5 | 30 days supply | Qty: 30 | Fill #1

## 2020-07-04 NOTE — Telephone Encounter (Signed)
Requested medication (s) are due for refill today: yes  Requested medication (s) are on the active medication list: yes  Last refill:  05/23/20 #60  Future visit scheduled: no  Notes to clinic:  Please review for refill. Refill not delegated per protocol.    Requested Prescriptions  Pending Prescriptions Disp Refills   traMADol (ULTRAM) 50 MG tablet [Pharmacy Med Name: traMADol HCL 50 MG TABS 50 Tablet] 60 tablet 0    Sig: Take 1 tablet (50 mg total) by mouth every 12 (twelve) hours as needed.      Not Delegated - Analgesics:  Opioid Agonists Failed - 07/04/2020 10:17 AM      Failed - This refill cannot be delegated      Failed - Urine Drug Screen completed in last 360 days      Passed - Valid encounter within last 6 months    Recent Outpatient Visits           1 month ago Type 2 diabetes mellitus without complication, without long-term current use of insulin (HCC)   [facility] Marcine Matar, MD   11 months ago Primary osteoarthritis of both knees   [facility] Marcine Matar, MD   1 year ago Medication management   [facility] Lois Huxley, Cornelius Moras, [name]   1 year ago Primary osteoarthritis of both knees   [facility] Storm Frisk, MD   1 year ago Essential hypertension   [facility] [place], Shea Stakes, NP

## 2020-07-05 ENCOUNTER — Other Ambulatory Visit: Payer: Self-pay

## 2020-07-05 ENCOUNTER — Ambulatory Visit: Admission: RE | Admit: 2020-07-05 | Payer: No Typology Code available for payment source | Source: Ambulatory Visit

## 2020-07-05 ENCOUNTER — Ambulatory Visit
Admission: RE | Admit: 2020-07-05 | Discharge: 2020-07-05 | Disposition: A | Discharge status: Home / Self Care | Payer: No Typology Code available for payment source | Source: Ambulatory Visit | Attending: Obstetrics and Gynecology | Admitting: Obstetrics and Gynecology

## 2020-07-05 DIAGNOSIS — R928 Other abnormal and inconclusive findings on diagnostic imaging of breast: Secondary | ICD-10-CM

## 2020-07-06 ENCOUNTER — Other Ambulatory Visit: Payer: Self-pay | Admitting: Internal Medicine

## 2020-07-08 MED FILL — traMADol HCL 50 MG TABS: 50 | 30 days supply | Qty: 60 | Fill #0

## 2020-08-13 MED FILL — AMLODIPINE BESYLATE 5 MG TA: 5 | 30 days supply | Qty: 30 | Fill #2

## 2020-08-14 MED FILL — MELOXICAM 15 MG TABLET: 15 | 30 days supply | Qty: 30 | Fill #1

## 2020-08-14 MED FILL — ?METFORMIN HCL 1000 MG TAB: 1000 | 30 days supply | Qty: 60 | Fill #0

## 2020-08-14 MED FILL — ?ATORVASTATIN 40MG TABLET: 40 | 30 days supply | Qty: 30 | Fill #0

## 2020-09-13 ENCOUNTER — Other Ambulatory Visit: Payer: Self-pay

## 2020-09-13 ENCOUNTER — Other Ambulatory Visit: Payer: Self-pay | Admitting: Internal Medicine

## 2020-09-13 MED ORDER — TRAMADOL HCL 50 MG PO TABS
ORAL_TABLET | Freq: Two times a day (BID) | ORAL | 0 refills | Status: AC | PRN
  Filled 2020-09-13: qty 60, 30d supply, fill #0

## 2020-09-13 MED FILL — Metformin HCl Tab 1000 MG: ORAL | 30 days supply | Qty: 60 | Fill #0 | Status: AC

## 2020-09-13 MED FILL — Meloxicam Tab 15 MG: ORAL | 30 days supply | Qty: 30 | Fill #0 | Status: AC

## 2020-09-13 MED FILL — Atorvastatin Calcium Tab 40 MG (Base Equivalent): ORAL | 30 days supply | Qty: 30 | Fill #0 | Status: AC

## 2020-09-13 MED FILL — Amlodipine Besylate Tab 5 MG (Base Equivalent): ORAL | 30 days supply | Qty: 30 | Fill #0 | Status: AC

## 2020-09-13 NOTE — Telephone Encounter (Signed)
Requested medication (s) are due for refill today: no  Requested medication (s) are on the active medication list:  yes  Last refill:  07/08/2020  Future visit scheduled: no  Notes to clinic:  this refill cannot be delegated    Requested Prescriptions  Pending Prescriptions Disp Refills   traMADol (ULTRAM) 50 MG tablet 60 tablet 0    Sig: TAKE 1 TABLET (50 MG TOTAL) BY MOUTH EVERY 12 (TWELVE) HOURS AS NEEDED.      Not Delegated - Analgesics:  Opioid Agonists Failed - 09/13/2020  9:36 AM      Failed - This refill cannot be delegated      Failed - Urine Drug Screen completed in last 360 days      Failed - Valid encounter within last 6 months    Recent Outpatient Visits   None

## 2020-09-17 ENCOUNTER — Other Ambulatory Visit: Payer: Self-pay

## 2020-10-22 ENCOUNTER — Other Ambulatory Visit: Payer: Self-pay | Admitting: Internal Medicine

## 2020-10-22 ENCOUNTER — Other Ambulatory Visit: Payer: Self-pay

## 2020-10-22 MED FILL — Atorvastatin Calcium Tab 40 MG (Base Equivalent): ORAL | 30 days supply | Qty: 30 | Fill #1 | Status: AC

## 2020-10-22 MED FILL — Metformin HCl Tab 1000 MG: ORAL | 30 days supply | Qty: 60 | Fill #1 | Status: AC

## 2020-10-22 MED FILL — Amlodipine Besylate Tab 5 MG (Base Equivalent): ORAL | 30 days supply | Qty: 30 | Fill #1 | Status: AC

## 2020-10-22 MED FILL — Meloxicam Tab 15 MG: ORAL | 30 days supply | Qty: 30 | Fill #1 | Status: AC

## 2020-10-22 NOTE — Telephone Encounter (Signed)
Requested medication (s) are due for refill today: yes   Requested medication (s) are on the active medication list: yes  Last refill:  09/16/2020  Future visit scheduled: no  Notes to clinic:  this refill cannot be delegated    Requested Prescriptions  Pending Prescriptions Disp Refills   traMADol (ULTRAM) 50 MG tablet 60 tablet 0    Sig: TAKE 1 TABLET (50 MG TOTAL) BY MOUTH EVERY 12 (TWELVE) HOURS AS NEEDED.      Not Delegated - Analgesics:  Opioid Agonists Failed - 10/22/2020 11:19 AM      Failed - This refill cannot be delegated      Failed - Urine Drug Screen completed in last 360 days      Passed - Valid encounter within last 6 months    Recent Outpatient Visits           5 months ago Type 2 diabetes mellitus without complication, without long-term current use of insulin (HCC)   [place] [facility] Marcine Matar, MD   1 year ago Primary osteoarthritis of both knees   [facility] Marcine Matar, MD   1 year ago Medication management   [facility] Lois Huxley, Cornelius Moras, [name]   1 year ago Primary osteoarthritis of both knees   [facility] Storm Frisk, MD   1 year ago Essential hypertension   [facility] [place], Shea Stakes, NP

## 2020-11-05 ENCOUNTER — Other Ambulatory Visit: Payer: Self-pay

## 2020-11-11 ENCOUNTER — Other Ambulatory Visit: Payer: Self-pay

## 2020-12-02 ENCOUNTER — Other Ambulatory Visit: Payer: Self-pay

## 2020-12-02 MED FILL — Meloxicam Tab 15 MG: ORAL | 30 days supply | Qty: 30 | Fill #2 | Status: AC

## 2020-12-02 MED FILL — Atorvastatin Calcium Tab 40 MG (Base Equivalent): ORAL | 30 days supply | Qty: 30 | Fill #2 | Status: AC

## 2020-12-02 MED FILL — Amlodipine Besylate Tab 5 MG (Base Equivalent): ORAL | 30 days supply | Qty: 30 | Fill #2 | Status: AC

## 2020-12-02 MED FILL — Metformin HCl Tab 1000 MG: ORAL | 30 days supply | Qty: 60 | Fill #2 | Status: AC

## 2021-01-09 ENCOUNTER — Emergency Department (HOSPITAL_COMMUNITY): Payer: No Typology Code available for payment source

## 2021-01-09 ENCOUNTER — Emergency Department (HOSPITAL_COMMUNITY)
Admission: EM | Admit: 2021-01-09 | Discharge: 2021-01-09 | Disposition: A | Discharge status: Home / Self Care | Payer: No Typology Code available for payment source | Attending: Emergency Medicine | Admitting: Emergency Medicine

## 2021-01-09 DIAGNOSIS — S61213A Laceration without foreign body of left middle finger without damage to nail, initial encounter: Secondary | ICD-10-CM | POA: Insufficient documentation

## 2021-01-09 DIAGNOSIS — Z87891 Personal history of nicotine dependence: Secondary | ICD-10-CM | POA: Insufficient documentation

## 2021-01-09 DIAGNOSIS — E785 Hyperlipidemia, unspecified: Secondary | ICD-10-CM | POA: Insufficient documentation

## 2021-01-09 DIAGNOSIS — E1169 Type 2 diabetes mellitus with other specified complication: Secondary | ICD-10-CM | POA: Insufficient documentation

## 2021-01-09 DIAGNOSIS — Z23 Encounter for immunization: Secondary | ICD-10-CM | POA: Insufficient documentation

## 2021-01-09 DIAGNOSIS — Z7984 Long term (current) use of oral hypoglycemic drugs: Secondary | ICD-10-CM | POA: Insufficient documentation

## 2021-01-09 DIAGNOSIS — M79645 Pain in left finger(s): Secondary | ICD-10-CM | POA: Insufficient documentation

## 2021-01-09 DIAGNOSIS — I10 Essential (primary) hypertension: Secondary | ICD-10-CM | POA: Insufficient documentation

## 2021-01-09 DIAGNOSIS — Z79899 Other long term (current) drug therapy: Secondary | ICD-10-CM | POA: Insufficient documentation

## 2021-01-09 DIAGNOSIS — W260XXA Contact with knife, initial encounter: Secondary | ICD-10-CM | POA: Insufficient documentation

## 2021-01-09 MED ORDER — BACITRACIN ZINC 500 UNIT/GM EX OINT: TOPICAL_OINTMENT | Freq: Two times a day (BID) | CUTANEOUS | Status: DC

## 2021-01-09 MED ORDER — TETANUS-DIPHTH-ACELL PERTUSSIS 5-2.5-18.5 LF-MCG/0.5 IM SUSY
0.5000 mL | PREFILLED_SYRINGE | Freq: Once | INTRAMUSCULAR | Status: AC
  Administered 2021-01-09: 0.5 mL via INTRAMUSCULAR
  Filled 2021-01-09: qty 0.5

## 2021-01-09 NOTE — ED Triage Notes (Signed)
Patient here after accidentally stabbing her left middle finger while cutting food. Hemorrhage controlled, 1cm incision to distal segment of finger.

## 2021-01-09 NOTE — Discharge Instructions (Addendum)
Keep wound clean and dry. Apply Bacitracin daily with dressing change, apply splint at needed to protect finger while at work. Recheck with your doctor in 2 days. Referral to hand specialist for concerning symptoms (inability to fully bend your finger after swelling resolves).  Mantenga la herida limpia y seca. Aplique Bacitracin diariamente con el cambio de apsito, aplique la frula necesaria para proteger el dedo mientras est en el [facility]. Vuelva a consultar con su mdico en 2 das. Derivacin a un especialista en manos por sntomas preocupantes (incapacidad para doblar completamente el dedo despus de que se resuelva la hinchazn).

## 2021-01-09 NOTE — ED Provider Notes (Signed)
[name] EMERGENCY DEPARTMENT Provider Note   CSN: [id number] Arrival date & time: 01/09/21  1255     History Chief Complaint  Patient presents with  . Finger Injury    Alexis Mooney is a 55 y.o. female.  55 year old female with history of diabetes, hyperlipidemia, hypertension presents with wound to her left third distal phalanx.  Patient is right-hand dominant, was using a knife to cut apart frozen chicken with a knife slipped and accidentally stabbed her left third distal phalanx and the pad of the digit.  Patient immediately irrigated the finger under running water and came to the ER for evaluation.  Bleeding is controlled, sensation intact, limited flexion of the finger secondary to pain and swelling.  No other injuries, complaints, concerns.      Past Medical History:  Diagnosis Date  . Abscess   . Diabetes ([place])   . High cholesterol   . Hyperlipidemia associated with type 2 diabetes mellitus ([place]) 07/11/2019  . Hypertension   . Miscarriage   . Pollen allergies     Patient Active Problem List   Diagnosis Date Noted  . Influenza vaccine refused 05/23/2020  . Bunion of great toe 05/23/2020  . 23-polyvalent pneumococcal polysaccharide vaccine declined 05/23/2020  . Hyperlipidemia associated with type 2 diabetes mellitus ([place]) 07/11/2019  . Type 2 diabetes mellitus without complication, without long-term current use of insulin ([place]) 07/11/2019  . Screening breast examination 04/25/2019  . Axillary lump, left 04/25/2019  . Frequency of urination 01/31/2019  . Dysuria 01/31/2019  . Epigastric pain 01/31/2019  . Primary osteoarthritis of both knees 07/06/2018  . Memory disorder 02/04/2018  . Anxiety and depression 03/19/2017  . Hair loss 03/19/2017  . High triglycerides 12/29/2016  . Morbid obesity with BMI of 40.0-44.9, adult ([place]) 10/19/2016  . Perimenopause 01/22/2014  . Essential hypertension 01/22/2014    Past Surgical History:   Procedure Laterality Date  . ABSCESS DRAINAGE    . BREAST BIOPSY Left    [place] Core bx, 2010?, pt not sure when     OB History     Gravida  1   Para      Term      Preterm      AB  1   Living         SAB  1   IAB      Ectopic      Multiple      Live Births              Family History  Problem Relation Age of Onset  . Cancer Sister 40       stomach  . Hypertension Mother   . Hypertension Father   . Breast cancer Cousin   . Hypertension Maternal Grandmother   . Hypertension Maternal Grandfather   . Breast cancer Paternal Aunt     Social History   Tobacco Use  . Smoking status: Former    Types: Cigarettes    Quit date: 09/24/1994    Years since quitting: 26.3  . Smokeless tobacco: Never  Vaping Use  . Vaping Use: Never used  Substance Use Topics  . Alcohol use: Yes    Comment: rarely  . Drug use: No    Home Medications Prior to Admission medications   Medication Sig Start Date End Date Taking? Authorizing Provider  amLODipine (NORVASC) 5 MG tablet Take 1 tablet (5 mg total) by mouth daily. 05/23/20   Ladell Pier, MD  amLODipine ([place])  5 MG tablet TAKE 1 TABLET (5 MG TOTAL) BY MOUTH DAILY. 05/23/20 05/23/21  Marcine Matar, MD  amLODipine (NORVASC) 5 MG tablet TAKE 1 TABLET (5 MG TOTAL) BY MOUTH DAILY. 05/07/20 05/07/21  Marcine Matar, MD  atorvastatin (LIPITOR) 40 MG tablet Take 1 tablet (40 mg total) by mouth daily. 05/23/20   Marcine Matar, MD  atorvastatin (LIPITOR) 40 MG tablet TAKE 1 TABLET (40 MG TOTAL) BY MOUTH DAILY. 05/23/20 05/23/21  Marcine Matar, MD  atorvastatin (LIPITOR) 40 MG tablet TAKE 1 TABLET (40 MG TOTAL) BY MOUTH DAILY. 05/07/20 05/07/21  Marcine Matar, MD  Blood Glucose Monitoring Suppl (TRUE METRIX METER) w/Device KIT Use as instructed. Check blood glucose level by fingerstick twice per day. 12/08/18   Claiborne Rigg, NP  glucose blood (TRUE METRIX BLOOD GLUCOSE TEST) test strip Use as  instructed. Check blood glucose level by fingerstick twice per day. 01/31/19   Storm Frisk, MD  hydrocortisone 2.5 % cream Apply topically daily. For 4 days Patient not taking: Reported on 06/13/2020 12/28/16   Marcine Matar, MD  loratadine (CLARITIN) 10 MG tablet Take 1 tablet (10 mg total) by mouth daily. 01/04/15   Ambrose Finland, NP  meloxicam (MOBIC) 15 MG tablet Take 1 tablet (15 mg total) by mouth daily. Patient not taking: Reported on 06/13/2020 05/23/20   Marcine Matar, MD  meloxicam (MOBIC) 15 MG tablet TAKE 1 TABLET (15 MG TOTAL) BY MOUTH DAILY. 05/23/20 05/23/21  Marcine Matar, MD  metFORMIN (GLUCOPHAGE) 1000 MG tablet Take 1 tablet (1,000 mg total) by mouth 2 (two) times daily with a meal. 05/23/20   Marcine Matar, MD  metFORMIN (GLUCOPHAGE) 1000 MG tablet TAKE 1 TABLET (1,000 MG TOTAL) BY MOUTH 2 (TWO) TIMES DAILY WITH A MEAL. 07/11/19 07/10/20  Marcine Matar, MD  metFORMIN (GLUCOPHAGE) 1000 MG tablet TAKE 1 TABLET (1,000 MG TOTAL) BY MOUTH 2 (TWO) TIMES DAILY WITH A MEAL. 05/23/20 05/23/21  Marcine Matar, MD  omeprazole (PRILOSEC) 20 MG capsule Take 1 capsule (20 mg total) by mouth daily. 07/11/19   Marcine Matar, MD  terbinafine (LAMISIL AT) 1 % cream Apply 1 application topically 2 (two) times daily. Patient not taking: Reported on 06/13/2020 05/23/20   Marcine Matar, MD  traMADol (ULTRAM) 50 MG tablet TAKE 1 TABLET (50 MG TOTAL) BY MOUTH EVERY 12 (TWELVE) HOURS AS NEEDED. 07/06/20   Marcine Matar, MD  traMADol (ULTRAM) 50 MG tablet TAKE 1 TABLET (50 MG TOTAL) BY MOUTH EVERY 12 (TWELVE) HOURS AS NEEDED. 09/13/20 03/14/21  Marcine Matar, MD  TRUEplus Lancets 28G MISC Use as instructed. Check blood glucose level by fingerstick twice per day. 01/31/19   Storm Frisk, MD    Allergies    Ampicillin and Bactrim [sulfamethoxazole-trimethoprim]  Review of Systems   Review of Systems  Constitutional:  Negative for fever.  Musculoskeletal:   Positive for joint swelling and myalgias. Negative for arthralgias.  Skin:  Positive for wound.  Allergic/Immunologic: Positive for immunocompromised state.  Neurological:  Negative for weakness and numbness.  Hematological:  Does not bruise/bleed easily.  Psychiatric/Behavioral:  Negative for suicidal ideas.    Physical Exam Updated Vital Signs BP (!) 123/96 (BP Location: Right Arm)   Pulse 77   Temp 98.1 F (36.7 C) (Oral)   Resp 18   LMP 02/12/2018 (Approximate)   SpO2 96%   Physical Exam Vitals and nursing note reviewed.  Constitutional:  General: She is not in acute distress.    Appearance: She is well-developed. She is not diaphoretic.  HENT:     Head: Normocephalic and atraumatic.  Cardiovascular:     Pulses: Normal pulses.  Pulmonary:     Effort: Pulmonary effort is normal.  Musculoskeletal:        General: Swelling and tenderness present. No deformity.     Comments: Approximately 4 mm puncture wound to left third distal phalanx through the pad of the finger, bleeding controlled, surrounding ecchymosis and swelling.  Limited full flexion secondary to swelling.  Sensation intact with brisk capillary refill present.  Skin:    General: Skin is warm and dry.     Findings: No erythema or rash.  Neurological:     Mental Status: She is alert and oriented to person, place, and time.     Sensory: No sensory deficit.     Motor: No weakness.  Psychiatric:        Behavior: Behavior normal.    ED Results / Procedures / Treatments   Labs (all labs ordered are listed, but only abnormal results are displayed) Labs Reviewed - No data to display  EKG None  Radiology DG Finger Middle Left  Result Date: 01/09/2021 CLINICAL DATA:  Knife laceration EXAM: LEFT MIDDLE FINGER 2+V COMPARISON:  None. FINDINGS: No fracture or dislocation of the left long finger. Soft tissue wound over the pad of the digit. Soft tissue edema. Joint spaces are preserved. IMPRESSION: No fracture or  dislocation of the left long finger. Soft tissue wound over the pad of the digit. Soft tissue edema. Electronically Signed   By: Eddie Candle M.D.   On: 01/09/2021 15:54    Procedures Procedures   Medications Ordered in ED Medications  bacitracin ointment (has no administration in time range)  Tdap (BOOSTRIX) injection 0.5 mL (0.5 mLs Intramuscular Given 01/09/21 1711)    ED Course  I have reviewed the triage vital signs and the nursing notes.  Pertinent labs & imaging results that were available during my care of the patient were reviewed by me and considered in my medical decision making (see chart for details).  Clinical Course as of 01/09/21 1736  Thu Jan 09, 5274  [id number] 55 year old female with left third finger injury as above.  On exam, found to have a small puncture wound to the pad of the left finger with bruising surrounding the area.  Wound was thoroughly irrigated at the time of the injury, no longer bleeding and does not require closure.  Tetanus was updated, antibiotic ointment placed and wound splinted.  Recommend recheck with PCP in 2 days.  Given referral to hand specialist and discussed with patient if she is unable to fully flex the finger after the swelling has resolved she should seek evaluation by hand. [LM]    Clinical Course User Index [LM] Roque Lias   MDM Rules/Calculators/A&P                           Final Clinical Impression(s) / ED Diagnoses Final diagnoses:  Laceration of left middle finger without foreign body without damage to nail, initial encounter    Rx / DC Orders ED Discharge Orders     None        Tacy Learn, PA-C 01/09/21 1736    Fredia Sorrow, MD 01/17/21 1539

## 2021-01-09 NOTE — ED Provider Notes (Signed)
Emergency Medicine Provider Triage Evaluation Note  Cayle Cordoba , a 55 y.o. female  was evaluated in triage.  Pt complains of injury to her left middle finger, using a knife to cut frozen chicken and the knife slipped cutting her left middle finger at the distal hand on the palmar aspect.  Denies paresthesias or weakness, neurovascular fully intact.  Updated tetanus shot..  Review of Systems  Positive: Left middle finger pain, laceration Negative: Headaches, fevers  Physical Exam  BP (!) 123/96 (BP Location: Right Arm)   Pulse 77   Temp 98.1 F (36.7 C) (Oral)   Resp 18   LMP 02/12/2018 (Approximate)   SpO2 96%  Gen:   Awake, no distress   Resp:  Normal effort  MSK:   Moves extremities without difficulty  Other:    Medical Decision Making  Medically screening exam initiated at 2:58 PM.  Appropriate orders placed.  Darian Ace was informed that the remainder of the evaluation will be completed by another provider, this initial triage assessment does not replace that evaluation, and the importance of remaining in the ED until their evaluation is complete.  Presents with laceration to her left middle finger patient need further work-up.   Carroll Sage, PA-C 01/09/21 1501    Horton, Clabe Seal, DO 01/10/21 [phone]

## 2021-01-14 ENCOUNTER — Other Ambulatory Visit: Payer: Self-pay

## 2021-01-14 MED FILL — Metformin HCl Tab 1000 MG: ORAL | 30 days supply | Qty: 60 | Fill #3 | Status: AC

## 2021-01-14 MED FILL — Meloxicam Tab 15 MG: ORAL | 30 days supply | Qty: 30 | Fill #3 | Status: AC

## 2021-01-14 MED FILL — Amlodipine Besylate Tab 5 MG (Base Equivalent): ORAL | 30 days supply | Qty: 30 | Fill #3 | Status: AC

## 2021-01-14 MED FILL — Atorvastatin Calcium Tab 40 MG (Base Equivalent): ORAL | 30 days supply | Qty: 30 | Fill #3 | Status: AC

## 2021-03-04 ENCOUNTER — Other Ambulatory Visit: Payer: Self-pay | Admitting: Internal Medicine

## 2021-03-04 ENCOUNTER — Other Ambulatory Visit: Payer: Self-pay

## 2021-03-04 MED ORDER — AMLODIPINE BESYLATE 5 MG PO TABS
ORAL_TABLET | Freq: Every day | ORAL | 0 refills | Status: DC
  Filled 2021-03-04: qty 30, 30d supply, fill #0

## 2021-03-04 MED ORDER — MELOXICAM 15 MG PO TABS
ORAL_TABLET | Freq: Every day | ORAL | 0 refills | Status: DC
  Filled 2021-03-04: qty 30, 30d supply, fill #0
  Filled 2021-05-12: qty 30, 30d supply, fill #1

## 2021-03-04 MED FILL — Metformin HCl Tab 1000 MG: ORAL | 30 days supply | Qty: 60 | Fill #4 | Status: AC

## 2021-03-04 MED FILL — Atorvastatin Calcium Tab 40 MG (Base Equivalent): ORAL | 30 days supply | Qty: 30 | Fill #4 | Status: AC

## 2021-03-05 ENCOUNTER — Other Ambulatory Visit: Payer: Self-pay

## 2021-05-05 ENCOUNTER — Other Ambulatory Visit: Payer: Self-pay

## 2021-05-05 DIAGNOSIS — Z1231 Encounter for screening mammogram for malignant neoplasm of breast: Secondary | ICD-10-CM

## 2021-05-12 ENCOUNTER — Other Ambulatory Visit: Payer: Self-pay | Admitting: Internal Medicine

## 2021-05-12 ENCOUNTER — Other Ambulatory Visit: Payer: Self-pay

## 2021-05-12 MED FILL — Metformin HCl Tab 1000 MG: ORAL | 30 days supply | Qty: 60 | Fill #5 | Status: AC

## 2021-05-12 MED FILL — Atorvastatin Calcium Tab 40 MG (Base Equivalent): ORAL | 30 days supply | Qty: 30 | Fill #5 | Status: AC

## 2021-05-12 NOTE — Telephone Encounter (Signed)
Requested medications are due for refill today.  yes  Requested medications are on the active medications list.  yes  Last refill. 02/12/2021  Future visit scheduled.   no  Notes to clinic.  Courtesy refill already given. Pt is more than 3 month overdue for an office visit.

## 2021-05-14 ENCOUNTER — Other Ambulatory Visit: Payer: Self-pay

## 2021-05-27 ENCOUNTER — Telehealth: Payer: Self-pay | Admitting: *Deleted

## 2021-05-27 ENCOUNTER — Other Ambulatory Visit: Payer: Self-pay

## 2021-05-27 NOTE — Telephone Encounter (Signed)
Medical Assistant used [employer] to contact patient.  Interpreter Name: Interpreter #:  06/17/21 10:10 is the patients appointment with PCP. Patient has not been seen since 2021 and received a courtesy refill previously in the year.

## 2021-06-17 ENCOUNTER — Ambulatory Visit: Payer: Self-pay | Attending: Internal Medicine | Admitting: Internal Medicine

## 2021-06-17 ENCOUNTER — Other Ambulatory Visit: Payer: Self-pay

## 2021-06-17 ENCOUNTER — Encounter: Payer: Self-pay | Admitting: Internal Medicine

## 2021-06-17 VITALS — BP 145/98 | HR 88 | Resp 16 | Ht 65.0 in | Wt 225.6 lb

## 2021-06-17 DIAGNOSIS — R079 Chest pain, unspecified: Secondary | ICD-10-CM

## 2021-06-17 DIAGNOSIS — E785 Hyperlipidemia, unspecified: Secondary | ICD-10-CM

## 2021-06-17 DIAGNOSIS — Z1211 Encounter for screening for malignant neoplasm of colon: Secondary | ICD-10-CM

## 2021-06-17 DIAGNOSIS — E119 Type 2 diabetes mellitus without complications: Secondary | ICD-10-CM

## 2021-06-17 DIAGNOSIS — E1169 Type 2 diabetes mellitus with other specified complication: Secondary | ICD-10-CM

## 2021-06-17 DIAGNOSIS — I10 Essential (primary) hypertension: Secondary | ICD-10-CM

## 2021-06-17 DIAGNOSIS — E669 Obesity, unspecified: Secondary | ICD-10-CM

## 2021-06-17 DIAGNOSIS — M170 Bilateral primary osteoarthritis of knee: Secondary | ICD-10-CM

## 2021-06-17 DIAGNOSIS — Z2821 Immunization not carried out because of patient refusal: Secondary | ICD-10-CM

## 2021-06-17 LAB — POCT GLYCOSYLATED HEMOGLOBIN (HGB A1C): HbA1c, POC (controlled diabetic range): 10.9 % — AB (ref 0.0–7.0)

## 2021-06-17 LAB — GLUCOSE, POCT (MANUAL RESULT ENTRY): POC Glucose: 225 mg/dl — AB (ref 70–99)

## 2021-06-17 MED ORDER — TRAMADOL HCL 50 MG PO TABS
50.0000 mg | ORAL_TABLET | Freq: Two times a day (BID) | ORAL | 1 refills | Status: DC | PRN
Start: 2021-06-17 — End: 2021-09-19
  Filled 2021-06-17: qty 60, 30d supply, fill #0
  Filled 2021-08-18: qty 60, 30d supply, fill #1

## 2021-06-17 MED ORDER — ATORVASTATIN CALCIUM 40 MG PO TABS
40.0000 mg | ORAL_TABLET | Freq: Every day | ORAL | 6 refills | Status: DC
  Filled 2021-06-17: qty 30, 30d supply, fill #0
  Filled 2021-07-15: qty 30, 30d supply, fill #1
  Filled 2021-08-18: qty 30, 30d supply, fill #2
  Filled 2021-09-19: qty 90, 90d supply, fill #3
  Filled 2021-12-15: qty 30, 30d supply, fill #4

## 2021-06-17 MED ORDER — MELOXICAM 15 MG PO TABS
15.0000 mg | ORAL_TABLET | Freq: Every day | ORAL | 1 refills | Status: DC
  Filled 2021-06-17: qty 30, 30d supply, fill #0
  Filled 2021-07-15: qty 30, 30d supply, fill #1
  Filled 2021-08-18: qty 30, 30d supply, fill #2
  Filled 2021-09-19: qty 90, 90d supply, fill #3

## 2021-06-17 MED ORDER — AMLODIPINE BESYLATE 5 MG PO TABS
5.0000 mg | ORAL_TABLET | Freq: Every day | ORAL | 6 refills | Status: DC
  Filled 2021-06-17: qty 30, 30d supply, fill #0
  Filled 2021-07-15: qty 30, 30d supply, fill #1
  Filled 2021-08-18: qty 30, 30d supply, fill #2
  Filled 2021-09-19: qty 90, 90d supply, fill #3
  Filled 2021-12-15: qty 30, 30d supply, fill #4

## 2021-06-17 MED ORDER — METFORMIN HCL 1000 MG PO TABS
1000.0000 mg | ORAL_TABLET | Freq: Two times a day (BID) | ORAL | 1 refills | Status: DC
  Filled 2021-06-17: qty 60, 30d supply, fill #0
  Filled 2021-07-15: qty 60, 30d supply, fill #1
  Filled 2021-08-18: qty 60, 30d supply, fill #2
  Filled 2021-09-19: qty 180, 90d supply, fill #3

## 2021-06-17 NOTE — Progress Notes (Signed)
Patient ID: Alexis Mooney, female    DOB: 09-Nov-1965  MRN: [id number]  CC: Diabetes and Hypertension   Subjective: Alexis Mooney is a 56 y.o. female who presents for chronic ds management. Last seen over 1 yr ago Her concerns today include:  History of DM type 2, HTN, HL, morbid obesity, osteoarthritis of the knees,   HYPERTENSION Currently taking: see medication list.  Suppose to be Norvasc but out of it x 3 wks Med Adherence: [x]  Yes  when she has the medication  Medication side effects: []  Yes    [x]  No Adherence with salt restriction: [x]  Yes    []  No Home Monitoring?: []  Yes    [x]  No Monitoring Frequency:  Home BP results range:  SOB? []  Yes    [x]  No Chest Pain?: [x]  Yes  -had some LT sided pulsating CP radiating to upper arm  yesterday while at work.  She found a pill of Norvasc in her purse and took it.  Pain went away shortly there after    Leg swelling?: []  Yes    [x]  No Headaches?: []  Yes    [x]  No Dizziness? [x]  Yes  - felt a little dizzy in shower this a.m  []  No Comments:   DIABETES TYPE 2 Last A1C:   Results for orders placed or performed in visit on 06/17/21  POCT glucose (manual entry)  Result Value Ref Range   POC Glucose 225 (A) 70 - 99 mg/dl  POCT glycosylated hemoglobin (Hb A1C)  Result Value Ref Range   Hemoglobin A1C     HbA1c POC (<> result, manual entry)     HbA1c, POC (prediabetic range)     HbA1c, POC (controlled diabetic range) 10.9 (A) 0.0 - 7.0 %    Med Adherence:  []  Yes    [x]  No - she forgets to take the evening dose of Metformin 4-5x/wk when she gets home from work at 12 midnight Medication side effects:  []  Yes    [x]  No Home Monitoring?  []  Yes    [x]  No; no device; declines rxn for testing supplies Home glucose results range: Diet Adherence: [x]  Yes  -does not drink sodas.  No fast foods, she cooks her food.  Eats out about twice a month.  No fried foods   Exercise: []  Yes    [x]  No - too tired when she gets home.   Use to work 8 hrs a day but now 20 hrs a wk.  Very active with house work at home Hypoglycemic episodes?: []  Yes    []  No Numbness of the feet? []  Yes    [x]  No Retinopathy hx? []  Yes    []  No Last eye exam: no blurred vision.  Over due for eye exam but no insurance. Comments:   HL;  reports taking Lipitor consistently  OA Knees: reports sometimes she has hard time with walking when she does not have Tramadol.  Helps her to function better at work.  She also takes meloxicam which she states is helpful.  She is requesting refill on tramadol.  She had requested a refill several weeks ago and I declined since she was overdue for follow-up visit. -She reports that she does not come consistently due to work, limited finances and transportation issues  HM:  has [facility] appt next wk. Due for colon CA screen and agrees to fit test.  Declines PCV and flu vaccine  Patient Active Problem List   Diagnosis Date Noted  Influenza vaccine refused 05/23/2020   Bunion of great toe 05/23/2020   23-polyvalent pneumococcal polysaccharide vaccine declined 05/23/2020   Hyperlipidemia associated with type 2 diabetes mellitus ([place]) 07/11/2019   Type 2 diabetes mellitus without complication, without long-term current use of insulin ([place]) 07/11/2019   Screening breast examination 04/25/2019   Axillary lump, left 04/25/2019   Frequency of urination 01/31/2019   Dysuria 01/31/2019   Epigastric pain 01/31/2019   Primary osteoarthritis of both knees 07/06/2018   Memory disorder 02/04/2018   Anxiety and depression 03/19/2017   Hair loss 03/19/2017   High triglycerides 12/29/2016   Morbid obesity with BMI of 40.0-44.9, adult ([place]) 10/19/2016   Perimenopause 01/22/2014   Essential hypertension 01/22/2014     Current Outpatient Medications on File Prior to Visit  Medication Sig Dispense Refill   Blood Glucose Monitoring Suppl (TRUE METRIX METER) w/Device KIT Use as instructed. Check blood glucose level by  fingerstick twice per day. 1 kit 0   glucose blood (TRUE METRIX BLOOD GLUCOSE TEST) test strip Use as instructed. Check blood glucose level by fingerstick twice per day. 100 each 12   loratadine (CLARITIN) 10 MG tablet Take 1 tablet (10 mg total) by mouth daily. 30 tablet 11   omeprazole (PRILOSEC) 20 MG capsule Take 1 capsule (20 mg total) by mouth daily. 90 capsule 1   TRUEplus Lancets 28G MISC Use as instructed. Check blood glucose level by fingerstick twice per day. 100 each 3   No current facility-administered medications on file prior to visit.    Allergies  Allergen Reactions   Ampicillin Hives   Bactrim [Sulfamethoxazole-Trimethoprim] Swelling    Social History   Socioeconomic History   Marital status: Single    Spouse name: Not on file   Number of children: Not on file   Years of education: Not on file   Highest education level: Some college, no degree  Occupational History   Not on file  Tobacco Use   Smoking status: Former    Types: Cigarettes    Quit date: 09/24/1994    Years since quitting: 26.7   Smokeless tobacco: Never  Vaping Use   Vaping Use: Never used  Substance and Sexual Activity   Alcohol use: Yes    Comment: rarely   Drug use: No   Sexual activity: Yes    Birth control/protection: None  Other Topics Concern   Not on file  Social History Narrative   Not on file   Social Determinants of Health   Financial Resource Strain: Not on file  Food Insecurity: Not on file  Transportation Needs: Not on file  Physical Activity: Not on file  Stress: Not on file  Social Connections: Not on file  Intimate Partner Violence: Not on file    Family History  Problem Relation Age of Onset   Cancer Sister 40       stomach   Hypertension Mother    Hypertension Father    Breast cancer Cousin    Hypertension Maternal Grandmother    Hypertension Maternal Grandfather    Breast cancer Paternal Aunt     Past Surgical History:  Procedure Laterality Date    ABSCESS DRAINAGE     BREAST BIOPSY Left    [place] Core bx, 2010?, pt not sure when    ROS: Review of Systems Negative except as stated above  PHYSICAL EXAM: BP (!) 145/98    Pulse 88    Resp 16    Ht $R'5\' 5"'Io$  (1.651 m)  Wt 225 lb 9.6 oz (102.3 kg)    LMP 02/12/2018 (Approximate)    SpO2 95%    BMI 37.54 kg/m   Wt Readings from Last 3 Encounters:  06/17/21 225 lb 9.6 oz (102.3 kg)  06/13/20 222 lb 9.6 oz (101 kg)  05/23/20 231 lb (104.8 kg)    Physical Exam  General appearance - alert, well appearing, middle-aged older Hispanic female and in no distress Mental status - normal mood, behavior, speech, dress, motor activity, and thought processes Mouth - mucous membranes moist, pharynx normal without lesions Neck - supple, no significant adenopathy Chest - clear to auscultation, no wheezes, rales or rhonchi, symmetric air entry Heart - normal rate, regular rhythm, normal S1, S2, no murmurs, rubs, clicks or gallops Extremities - peripheral pulses normal, no pedal edema, no clubbing or cyanosis Diabetic Foot Exam - Simple   Simple Foot Form Visual Inspection See comments: Yes Sensation Testing Intact to touch and monofilament testing bilaterally: Yes Pulse Check Posterior Tibialis and Dorsalis pulse intact bilaterally: Yes Comments Noninflamed bunions of both big toes.  1 cm callus on the medial aspect of the right big toe.      CMP Latest Ref Rng & Units 05/23/2020 07/18/2019 11/30/2018  Glucose 65 - 99 mg/dL 166(H) - 210(H)  BUN 6 - 24 mg/dL 8 - 11  Creatinine 0.57 - 1.00 mg/dL 0.51(L) - 0.53(L)  Sodium 134 - 144 mmol/L 139 - 139  Potassium 3.5 - 5.2 mmol/L 4.1 - 4.3  Chloride 96 - 106 mmol/L 101 - 102  CO2 20 - 29 mmol/L 24 - 22  Calcium 8.7 - 10.2 mg/dL 9.2 - 9.5  Total Protein 6.0 - 8.5 g/dL 6.8 6.6 6.8  Total Bilirubin 0.0 - 1.2 mg/dL 0.3 0.5 0.5  Alkaline Phos 44 - 121 IU/L 87 114 82  AST 0 - 40 IU/L 13 15 33  ALT 0 - 32 IU/L 19 20 40(H)   Lipid Panel     Component  Value Date/Time   CHOL 153 07/18/2019 0838   TRIG 155 (H) 07/18/2019 0838   TRIG 162 (H) 12/11/2015 0906   HDL 47 07/18/2019 0838   CHOLHDL 3.3 07/18/2019 0838   CHOLHDL 4.8 09/25/2014 0912   VLDL 56 (H) 09/25/2014 0912   LDLCALC 79 07/18/2019 0838    CBC    Component Value Date/Time   WBC 7.2 05/23/2020 0950   WBC 6.6 03/18/2010 2119   RBC 4.84 05/23/2020 0950   RBC 4.67 03/18/2010 2119   HGB 13.8 05/23/2020 0950   HCT 41.5 05/23/2020 0950   PLT 305 05/23/2020 0950   MCV 86 05/23/2020 0950   MCH 28.5 05/23/2020 0950   MCH 30.1 12/08/2009 1329   MCHC 33.3 05/23/2020 0950   MCHC 31.7 03/18/2010 2119   RDW 13.0 05/23/2020 0950   LYMPHSABS 2.6 03/18/2010 2119   MONOABS 0.5 03/18/2010 2119   EOSABS 0.2 03/18/2010 2119   BASOSABS 0.0 03/18/2010 2119    ASSESSMENT AND PLAN: 1. Type 2 diabetes mellitus with obesity (HCC) Not at goal Discussed the importance of healthy eating habits, regular exercise (at least 150 minutes a week as tolerated) and medication compliance to achieve and maintain control of diabetes. -  Recommend that she takes a pillbox with her to work with evening dose of metformin to take on her lunch break.  Recommend adding another oral medication in the form of Jardiance or once weekly injection of Trulicity.  However patient declines stating that she wants to see  how her A1c looks on return visit once she starts taking the medicine consistently. Try to get an eye exam when she can afford. - POCT glucose (manual entry) - POCT glycosylated hemoglobin (Hb A1C) - Microalbumin / creatinine urine ratio - CBC - Comprehensive metabolic panel - Lipid panel - metFORMIN (GLUCOPHAGE) 1000 MG tablet; Take 1 tablet (1,000 mg total) by mouth 2 (two) times daily with a meal.  Dispense: 180 tablet; Refill: 1  2. Essential hypertension Not at goal.  Refill amlodipine - amLODipine (NORVASC) 5 MG tablet; Take 1 tablet (5 mg total) by mouth daily.  Dispense: 30 tablet; Refill:  6  3. Hyperlipidemia associated with type 2 diabetes mellitus (HCC) - atorvastatin (LIPITOR) 40 MG tablet; Take 1 tablet (40 mg total) by mouth daily.  Dispense: 30 tablet; Refill: 6  4. Primary osteoarthritis of both knees Strongly encouraged weight loss through healthy eating habits and trying to get in some exercise as much as her knees would allow. Controlled substance prescribing agreement updated today.  [place] controlled substance reporting system reviewed and is appropriate.  She has not had any aberrant behaviors in the past.  Refill given on tramadol which she reports is helpful in maintaining her function.  Reports no adverse side effects from the medication.  Stressed the importance of regular follow-up in order for [place] to continue to fill tramadol. - meloxicam (MOBIC) 15 MG tablet; Take 1 tablet (15 mg total) by mouth daily.  Dispense: 90 tablet; Refill: 1 - traMADol (ULTRAM) 50 MG tablet; Take 1 tablet (50 mg total) by mouth every 12 (twelve) hours as needed.  Dispense: 60 tablet; Refill: 1  5. Chest pain in adult She has risk factors for heart disease including diabetes, hypertension and hyperlipidemia.  I recommend referral to cardiology for further evaluation.  However patient declined stating that if she has another episode she will let me know.  I recommend that she purchase and start taking a baby aspirin daily.  6. Screening for colon cancer - Fecal occult blood, imunochemical  7. Pneumococcal vaccination declined   Patient was given the opportunity to ask questions.  Patient verbalized understanding of the plan and was able to repeat key elements of the plan.   Orders Placed This Encounter  Procedures   Fecal occult blood, imunochemical   Microalbumin / creatinine urine ratio   CBC   Comprehensive metabolic panel   Lipid panel   POCT glucose (manual entry)   POCT glycosylated hemoglobin (Hb A1C)     Requested Prescriptions   Signed Prescriptions Disp  Refills   metFORMIN (GLUCOPHAGE) 1000 MG tablet 180 tablet 1    Sig: Take 1 tablet (1,000 mg total) by mouth 2 (two) times daily with a meal.   atorvastatin (LIPITOR) 40 MG tablet 30 tablet 6    Sig: Take 1 tablet (40 mg total) by mouth daily.   amLODipine (NORVASC) 5 MG tablet 30 tablet 6    Sig: Take 1 tablet (5 mg total) by mouth daily.   meloxicam (MOBIC) 15 MG tablet 90 tablet 1    Sig: Take 1 tablet (15 mg total) by mouth daily.   traMADol (ULTRAM) 50 MG tablet 60 tablet 1    Sig: Take 1 tablet (50 mg total) by mouth every 12 (twelve) hours as needed.    Return in about 4 months (around 10/15/2021).  Karle Plumber, MD, FACP

## 2021-06-18 LAB — CBC
Hematocrit: 48.3 % — ABNORMAL HIGH (ref 34.0–46.6)
Hemoglobin: 16 g/dL — ABNORMAL HIGH (ref 11.1–15.9)
MCH: 28.8 pg (ref 26.6–33.0)
MCHC: 33.1 g/dL (ref 31.5–35.7)
MCV: 87 fL (ref 79–97)
Platelets: 309 10*3/uL (ref 150–450)
RBC: 5.55 x10E6/uL — ABNORMAL HIGH (ref 3.77–5.28)
RDW: 12.7 % (ref 11.7–15.4)
WBC: 6.9 10*3/uL (ref 3.4–10.8)

## 2021-06-18 LAB — LIPID PANEL
Chol/HDL Ratio: 5.1 ratio — ABNORMAL HIGH (ref 0.0–4.4)
Cholesterol, Total: 233 mg/dL — ABNORMAL HIGH (ref 100–199)
HDL: 46 mg/dL (ref >39)
LDL Chol Calc (NIH): 108 mg/dL — ABNORMAL HIGH (ref 0–99)
Triglycerides: 460 mg/dL — ABNORMAL HIGH (ref 0–149)
VLDL Cholesterol Cal: 79 mg/dL — ABNORMAL HIGH (ref 5–40)

## 2021-06-18 LAB — MICROALBUMIN / CREATININE URINE RATIO
Creatinine, Urine: 93.9 mg/dL
Microalb/Creat Ratio: 55 mg/g creat — ABNORMAL HIGH (ref 0–29)
Microalbumin, Urine: 51.4 ug/mL

## 2021-06-18 LAB — COMPREHENSIVE METABOLIC PANEL
ALT: 28 IU/L (ref 0–32)
AST: 20 IU/L (ref 0–40)
Albumin/Globulin Ratio: 1.9 (ref 1.2–2.2)
Albumin: 4.5 g/dL (ref 3.8–4.9)
Alkaline Phosphatase: 117 IU/L (ref 44–121)
BUN/Creatinine Ratio: 22 (ref 9–23)
BUN: 11 mg/dL (ref 6–24)
Bilirubin Total: 0.5 mg/dL (ref 0.0–1.2)
CO2: 25 mmol/L (ref 20–29)
Calcium: 9.5 mg/dL (ref 8.7–10.2)
Chloride: 95 mmol/L — ABNORMAL LOW (ref 96–106)
Creatinine, Ser: 0.5 mg/dL — ABNORMAL LOW (ref 0.57–1.00)
Globulin, Total: 2.4 g/dL (ref 1.5–4.5)
Glucose: 186 mg/dL — ABNORMAL HIGH (ref 70–99)
Potassium: 4.1 mmol/L (ref 3.5–5.2)
Sodium: 132 mmol/L — ABNORMAL LOW (ref 134–144)
Total Protein: 6.9 g/dL (ref 6.0–8.5)
eGFR: 111 mL/min/{1.73_m2} (ref >59)

## 2021-06-18 NOTE — Progress Notes (Signed)
Let patient know that her cholesterol level is elevated.  Please make sure that she takes the atorvastatin daily as prescribed.  Kidney liver function tests normal.  Sodium level slightly low.  We will observe for now.  Blood cell count is elevated.  In regards to this, please find out if patient snores loud at nights and has daytime tiredness/sleepiness.  If she does, we would likely need to do a sleep study to rule out sleep apnea but first she would need to apply for the orange card/cone discount card.

## 2021-06-20 LAB — FECAL OCCULT BLOOD, IMMUNOCHEMICAL: Fecal Occult Bld: NEGATIVE

## 2021-06-24 ENCOUNTER — Other Ambulatory Visit: Payer: Self-pay

## 2021-06-24 ENCOUNTER — Ambulatory Visit: Payer: No Typology Code available for payment source | Admitting: *Deleted

## 2021-06-24 ENCOUNTER — Ambulatory Visit
Admission: RE | Admit: 2021-06-24 | Discharge: 2021-06-24 | Disposition: A | Discharge status: Home / Self Care | Payer: No Typology Code available for payment source | Source: Ambulatory Visit | Attending: Internal Medicine | Admitting: Internal Medicine

## 2021-06-24 VITALS — BP 126/88 | Wt 223.2 lb

## 2021-06-24 DIAGNOSIS — Z1239 Encounter for other screening for malignant neoplasm of breast: Secondary | ICD-10-CM

## 2021-06-24 DIAGNOSIS — Z1231 Encounter for screening mammogram for malignant neoplasm of breast: Secondary | ICD-10-CM

## 2021-06-24 NOTE — Progress Notes (Signed)
[name] is a 56 y.o. female who presents to [facility] clinic today with complaint of a shock of electricity pain within her left chest that radiated to her breast around 1 week ago that patient rated at a 7 out of 10. Patient stated during that time she had not been taking her BP medications due to not being able to get them refilled. Patient stated after she got into her PCP and started taking her BP medications again that the pain resolved.    Pap Smear: Pap smear not completed today. Last Pap smear was 03/08/2018 at [facility] and normal with negative HPV. Per patient has no history of an abnormal Pap smear. Last two Pap smear results are in Epic.   Physical exam: Breasts Breasts symmetrical. No skin abnormalities bilateral breasts. A small blackhead bump observed center of chest between breasts that per patient sometimes drains. No nipple retraction bilateral breasts. No nipple discharge bilateral breasts. No lymphadenopathy. No lumps palpated bilateral breasts. No complaints of pain or tenderness on exam.      MS DIGITAL SCREENING BILATERAL  Result Date: 10/27/2016 CLINICAL DATA:  Screening. EXAM: DIGITAL SCREENING BILATERAL MAMMOGRAM WITH CAD COMPARISON:  Previous exam(s). ACR Breast Density Category c: The breast tissue is heterogeneously dense, which may obscure small masses. FINDINGS: There are no findings suspicious for malignancy. Images were processed with CAD. IMPRESSION: No mammographic evidence of malignancy. A result letter of this screening mammogram will be mailed directly to the patient. RECOMMENDATION: Screening mammogram in one year. (Code:SM-B-01Y) BI-RADS CATEGORY  1: Negative. Electronically Signed   By: Gerome Sam III M.D   On: 10/27/2016 17:35   MM DIAG BREAST TOMO BILATERAL  Addendum Date: 03/08/2018   ADDENDUM REPORT: 03/08/2018 16:41 ADDENDUM: Addendum for addition of recommendation #2: Screening mammogram in one year.(Code:SM-B-01Y) Electronically  Signed   By: Annia Belt M.D.   On: 03/08/2018 16:41   Result Date: 03/08/2018 CLINICAL DATA:  Patient presents for palpable abnormality within the median aspect of the chest. EXAM: DIGITAL DIAGNOSTIC BILATERAL MAMMOGRAM WITH CAD AND TOMO ULTRASOUND RIGHT BREAST COMPARISON:  Previous exam(s). ACR Breast Density Category c: The breast tissue is heterogeneously dense, which may obscure small masses. FINDINGS: No concerning masses, calcifications or distortion identified within either breast. Mammographic images were processed with CAD. On physical exam, there is a small inflamed mass within the central aspect of the chest wall. Targeted ultrasound is performed, showing a 1.9 x 2.3 x 0.6 cm predominantly solid mass with internal color vascularity with thick internal fluid right breast 3 o'clock position 14 cm from the nipple. IMPRESSION: Palpable abnormality corresponds within inflamed sebaceous cyst. RECOMMENDATION: Surgical consultation for removal of recurrent sebaceous cyst central right breast. I have discussed the findings and recommendations with the patient. Results were also provided in writing at the conclusion of the visit. If applicable, a reminder letter will be sent to the patient regarding the next appointment. BI-RADS CATEGORY  2: Benign. Electronically Signed: By: Annia Belt M.D. On: 03/08/2018 14:03   MS DIGITAL SCREENING TOMO BILATERAL  Result Date: 06/14/2020 CLINICAL DATA:  Screening. EXAM: DIGITAL SCREENING BILATERAL MAMMOGRAM WITH TOMO AND CAD COMPARISON:  Previous exam(s). ACR Breast Density Category c: The breast tissue is heterogeneously dense, which may obscure small masses. FINDINGS: In the left breast, a possible asymmetry warrants further evaluation. In the right breast, no findings suspicious for malignancy. Images were processed with CAD. IMPRESSION: Further evaluation is suggested for possible asymmetry in the left breast. RECOMMENDATION: Diagnostic  mammogram and possibly  ultrasound of the left breast. (Code:[id number]-49M) The patient will be contacted regarding the findings, and additional imaging will be scheduled. BI-RADS CATEGORY  0: Incomplete. Need additional imaging evaluation and/or prior mammograms for comparison. Electronically Signed   By: Elberta Fortisaniel  Boyle M.D.   On: 06/14/2020 07:53   MS DIGITAL DIAG TOMO BILAT  Result Date: 05/02/2019 CLINICAL DATA:  56 year old female with history of infected/inflamed sebaceous cysts presents with palpable area of concern in the left axilla which she no longer feels. EXAM: DIGITAL DIAGNOSTIC BILATERAL MAMMOGRAM WITH CAD AND TOMO BILATERAL BREAST ULTRASOUND COMPARISON:  Previous exam(s). ACR Breast Density Category c: The breast tissue is heterogeneously dense, which may obscure small masses. FINDINGS: There is an asymmetry within the upper slightly inner right breast measuring approximately 0.8 cm. No suspicious masses or calcifications are seen in the left breast. Mammographic images were processed with CAD. Targeted ultrasound of the upper and on upper inner right breast was performed demonstrating several small cysts and clusters of cysts. A cluster of cysts at 1 o'clock 6 cm from nipple measures 0.4 x 0.2 x 0.8 cm. This is felt to correspond well with the asymmetry within the upper slightly inner right breast at mammography. Targeted ultrasound of the left axilla was performed. No suspicious masses or abnormality seen, only scarring related to patient's prior excision in this location. IMPRESSION: No findings of malignancy in either breast. RECOMMENDATION: Screening mammogram in one year.(Code:SM-B-01Y) I have discussed the findings and recommendations with the patient. If applicable, a reminder letter will be sent to the patient regarding the next appointment. BI-RADS CATEGORY  2: Benign. Electronically Signed   By: Edwin CapJennifer  Jarosz M.D.   On: 05/02/2019 10:41   MS DIGITAL DIAG TOMO UNI LEFT  Result Date: 07/05/2020 CLINICAL DATA:   Possible asymmetry in the outer left breast in the craniocaudal projection of the recent screening mammogram. EXAM: DIGITAL DIAGNOSTIC UNILATERAL LEFT MAMMOGRAM WITH TOMO AND CAD TECHNIQUE: Left digital diagnostic mammography and breast tomosynthesis was performed. Digital images of the breasts were evaluated with computer-aided detection. COMPARISON:  Previous exam(s). ACR Breast Density Category c: The breast tissue is heterogeneously dense, which may obscure small masses. FINDINGS: 3D tomographic and 2D generated true lateral and spot compression craniocaudal images of the left breast demonstrate normal appearing fibroglandular tissue at the location of the recently suspected asymmetry, unchanged compared previous examinations. IMPRESSION: No evidence of malignancy. The recently suspected left breast asymmetry was close apposition of normal breast tissue. RECOMMENDATION: Bilateral screening mammogram in 1 year. I have discussed the findings and recommendations with the patient. If applicable, a reminder letter will be sent to the patient regarding the next appointment. BI-RADS CATEGORY  1: Negative. Electronically Signed   By: Beckie SaltsSteven  Reid M.D.   On: 07/05/2020 09:44    Pelvic/Bimanual Pap is not indicated today per BCCCP guidelines.   Smoking History: Patient is former smoker that quit 09/24/1994.   Patient Navigation: Patient education provided. Access to services provided for patient through BCCCP program.   Colorectal Cancer Screening: Per patient had a colonoscopy completed 27 years ago. FIT Test completed 06/17/2021, 08/29/2019, and 03/14/2018 and negative. No complaints today.   Breast and Cervical Cancer Risk Assessment: Patient has a family history of her paternal aunt having breast cancer. Patient has no known genetic mutations or history of radiation treatment to the chest before age 56. Patient does not have history of cervical dysplasia, immunocompromised, or DES exposure  in-utero.  Risk Assessment  Risk Scores       06/24/2021 06/13/2020   Last edited by: Narda Rutherford, LPN Meryl Dare, CMA   5-year risk: 1 % 0.9 %   Lifetime risk: 6.8 % 7 %            A: BCCCP exam without pap smear No complaints today.  P: [facility] to the [facility] for a screening mammogram on mobile unit. Appointment scheduled Tuesday, June 24, 2021 at 0940.  Priscille Heidelberg, RN 06/24/2021 8:38 AM

## 2021-06-24 NOTE — Patient Instructions (Signed)
Explained breast self awareness with [name]. Patient did not need a Pap smear today due to last Pap smear and HPV typing was 03/08/2018. Let her know BCCCP will cover Pap smears and HPV typing every 5 years unless has a history of abnormal Pap smears. Referred patient to the [facility] for a screening mammogram on mobile unit. Appointment scheduled Tuesday, June 24, 2021 at [place] [facility]. Patient escorted to the mobile unit following BCCCP appointment for her screening mammogram. Let patient know the Breast Center will follow up with her within the next couple weeks with results of her mammogram by letter or phone. Alexis Mooney verbalized understanding.  [name], [name], RN 8:38 AM

## 2021-06-26 ENCOUNTER — Other Ambulatory Visit: Payer: Self-pay | Admitting: Obstetrics and Gynecology

## 2021-06-26 DIAGNOSIS — R928 Other abnormal and inconclusive findings on diagnostic imaging of breast: Secondary | ICD-10-CM

## 2021-07-07 ENCOUNTER — Other Ambulatory Visit: Payer: Self-pay

## 2021-07-07 ENCOUNTER — Ambulatory Visit: Payer: No Typology Code available for payment source

## 2021-07-07 ENCOUNTER — Ambulatory Visit
Admission: RE | Admit: 2021-07-07 | Discharge: 2021-07-07 | Disposition: A | Discharge status: Home / Self Care | Payer: No Typology Code available for payment source | Source: Ambulatory Visit | Attending: Obstetrics and Gynecology | Admitting: Obstetrics and Gynecology

## 2021-07-07 DIAGNOSIS — R928 Other abnormal and inconclusive findings on diagnostic imaging of breast: Secondary | ICD-10-CM

## 2021-07-08 ENCOUNTER — Other Ambulatory Visit: Payer: No Typology Code available for payment source

## 2021-07-15 ENCOUNTER — Other Ambulatory Visit: Payer: Self-pay

## 2021-08-18 ENCOUNTER — Other Ambulatory Visit: Payer: Self-pay

## 2021-08-19 ENCOUNTER — Telehealth: Payer: Self-pay

## 2021-08-19 NOTE — Telephone Encounter (Signed)
Form has been located and have be put into provider fax folder  ?

## 2021-08-19 NOTE — Telephone Encounter (Signed)
Copied from CRM [id number]. Topic: General - Other ?>> Aug 18, 2021  8:24 AM Traci Sermon wrote: ?Reason for CRM: Pt called in stating she is needing consent form signed to get her tooth pulled and is requesting if someone can give her a call back, please advise. ?

## 2021-08-20 NOTE — Telephone Encounter (Signed)
Form has been signed and faxed back 

## 2021-09-19 ENCOUNTER — Other Ambulatory Visit: Payer: Self-pay

## 2021-09-19 ENCOUNTER — Other Ambulatory Visit: Payer: Self-pay | Admitting: Internal Medicine

## 2021-09-19 DIAGNOSIS — M170 Bilateral primary osteoarthritis of knee: Secondary | ICD-10-CM

## 2021-09-19 NOTE — Telephone Encounter (Signed)
Requested medication (s) are due for refill today:   Provider to review ? ?Requested medication (s) are on the active medication list:   Yes ? ?Future visit scheduled:   No  ? ? ?Last ordered: 06/17/2021 #60, 1 refill ? ?Non delegated refill  ? ?Requested Prescriptions  ?Pending Prescriptions Disp Refills  ? traMADol (ULTRAM) 50 MG tablet 60 tablet 1  ?  Sig: Take 1 tablet (50 mg total) by mouth every 12 (twelve) hours as needed.  ?  ? Not Delegated - Analgesics:  Opioid Agonists Failed - 09/19/2021  9:27 AM  ?  ?  Failed - This refill cannot be delegated  ?  ?  Failed - Urine Drug Screen completed in last 360 days  ?  ?  Failed - Valid encounter within last 3 months  ?  Recent Outpatient Visits   ? ?      ? 3 months ago Type 2 diabetes mellitus with obesity (HCC)  ? [facility] Marcine Matar, MD  ? 1 year ago Type 2 diabetes mellitus without complication, without long-term current use of insulin (HCC)  ? [facility] Jonah Blue B, MD  ? 2 years ago Primary osteoarthritis of both knees  ? [facility] Marcine Matar, MD  ? 2 years ago Medication management  ? [facility] Lois Huxley, Cornelius Moras, [name]  ? 2 years ago Primary osteoarthritis of both knees  ? [facility] Storm Frisk, MD  ? ?  ?  ? ? ?  ?  ?  ? ?

## 2021-09-20 MED ORDER — TRAMADOL HCL 50 MG PO TABS
50.0000 mg | ORAL_TABLET | Freq: Two times a day (BID) | ORAL | 0 refills | Status: DC | PRN
Start: 2021-09-20 — End: 2022-03-11
  Filled 2021-09-20: qty 60, 30d supply, fill #0

## 2021-09-22 ENCOUNTER — Other Ambulatory Visit: Payer: Self-pay

## 2021-09-23 ENCOUNTER — Other Ambulatory Visit: Payer: Self-pay

## 2021-10-09 ENCOUNTER — Ambulatory Visit
Admission: RE | Admit: 2021-10-09 | Discharge: 2021-10-09 | Disposition: A | Discharge status: Home / Self Care | Payer: No Typology Code available for payment source | Source: Ambulatory Visit | Attending: Obstetrics and Gynecology | Admitting: Obstetrics and Gynecology

## 2021-10-09 ENCOUNTER — Other Ambulatory Visit: Payer: Self-pay | Admitting: Obstetrics and Gynecology

## 2021-10-09 DIAGNOSIS — R7611 Nonspecific reaction to tuberculin skin test without active tuberculosis: Secondary | ICD-10-CM

## 2021-11-07 ENCOUNTER — Ambulatory Visit: Payer: Self-pay | Admitting: *Deleted

## 2021-11-07 DIAGNOSIS — K0889 Other specified disorders of teeth and supporting structures: Secondary | ICD-10-CM

## 2021-11-07 DIAGNOSIS — Z98811 Dental restoration status: Secondary | ICD-10-CM

## 2021-11-07 NOTE — Telephone Encounter (Signed)
  Chief Complaint: mouth pain Symptoms: missing filling/broken tooth Frequency: 4 days Pertinent Negatives: Patient denies fever Disposition: [x] ED /[] Urgent Care (no appt availability in office) / [] Appointment(In office/virtual)/ []  [place] [facility]/ [] Home Care/ [] Refused Recommended Disposition /[] [place] [facility]/ []  Follow-up with PCP Additional Notes: Advised ED for pain- needs referral to dentist. Advised saline rinse, Tylenol/ibuprofen

## 2021-11-07 NOTE — Telephone Encounter (Signed)
Reason for Disposition  Toothache present > 24 hours  Answer Assessment - Initial Assessment Questions 1. ONSET: "When did the mouth start hurting?" (e.g., hours or days ago)      4 days ago- broken tooth, possible filling out- lower R side 2. SEVERITY: "How bad is the pain?" (Scale 1-10; mild, moderate or severe)   - MILD (1-3):  doesn't interfere with eating or normal activities   - MODERATE (4-7): interferes with eating some solids and normal activities   - SEVERE (8-10):  excruciating pain, interferes with most normal activities   - SEVERE DYSPHAGIA: can't swallow liquids, drooling     Moderate/severe 3. SORES: "Are there any sores or ulcers in the mouth?" If Yes, ask: "What part of the mouth are the sores in?"     no 4. FEVER: "Do you have a fever?" If Yes, ask: "What is your temperature, how was it measured, and when did it start?"     no 5. CAUSE: "What do you think is causing the mouth pain?"     Broken tooth 6. OTHER SYMPTOMS: "Do you have any other symptoms?" (e.g., difficulty breathing)     Hard teeth  Protocols used: Mouth Pain-A-AH, Toothache-A-AH

## 2021-11-08 NOTE — Addendum Note (Signed)
Addended by: Karle Plumber B on: 11/08/2021 09:27 AM   Modules accepted: Orders

## 2021-11-10 NOTE — Telephone Encounter (Signed)
Called pt made aware of the referral.

## 2021-12-15 ENCOUNTER — Other Ambulatory Visit: Payer: Self-pay | Admitting: Internal Medicine

## 2021-12-15 ENCOUNTER — Other Ambulatory Visit: Payer: Self-pay

## 2021-12-15 DIAGNOSIS — M170 Bilateral primary osteoarthritis of knee: Secondary | ICD-10-CM

## 2021-12-16 ENCOUNTER — Other Ambulatory Visit: Payer: Self-pay

## 2021-12-16 MED ORDER — MELOXICAM 15 MG PO TABS
15.0000 mg | ORAL_TABLET | Freq: Every day | ORAL | 0 refills | Status: DC
  Filled 2021-12-16: qty 90, 90d supply, fill #0

## 2021-12-16 NOTE — Telephone Encounter (Signed)
Requested Prescriptions  Pending Prescriptions Disp Refills  . meloxicam (MOBIC) 15 MG tablet 90 tablet 0    Sig: Take 1 tablet (15 mg total) by mouth daily.     Analgesics:  COX2 Inhibitors Failed - 12/15/2021 10:05 AM      Failed - Manual Review: Labs are only required if the patient has taken medication for more than 8 weeks.      Failed - HGB in normal range and within 360 days    Hemoglobin  Date Value Ref Range Status  06/17/2021 16.0 (H) 11.1 - 15.9 g/dL Final         Failed - Cr in normal range and within 360 days    Creat  Date Value Ref Range Status  01/04/2015 0.46 (L) 0.50 - 1.10 mg/dL Final   Creatinine, Ser  Date Value Ref Range Status  06/17/2021 0.50 (L) 0.57 - 1.00 mg/dL Final         Failed - HCT in normal range and within 360 days    Hematocrit  Date Value Ref Range Status  06/17/2021 48.3 (H) 34.0 - 46.6 % Final         Passed - AST in normal range and within 360 days    AST  Date Value Ref Range Status  06/17/2021 20 0 - 40 IU/L Final         Passed - ALT in normal range and within 360 days    ALT  Date Value Ref Range Status  06/17/2021 28 0 - 32 IU/L Final         Passed - eGFR is 30 or above and within 360 days    GFR calc Af Amer  Date Value Ref Range Status  05/23/2020 126 >59 mL/min/1.73 Final    Comment:    **In accordance with recommendations from the NKF-ASN Task force,**   [facility] is in the process of updating its eGFR calculation to the   2021 CKD-EPI creatinine equation that estimates kidney function   without a race variable.    GFR calc non Af Amer  Date Value Ref Range Status  05/23/2020 109 >59 mL/min/1.73 Final   eGFR  Date Value Ref Range Status  06/17/2021 111 >59 mL/min/1.73 Final         Passed - Patient is not pregnant      Passed - Valid encounter within last 12 months    Recent Outpatient Visits          6 months ago Type 2 diabetes mellitus with obesity ([place])   [place], Neoma Laming B, MD   1 year ago Type 2 diabetes mellitus without complication, without long-term current use of insulin ([place])   [place], MD   2 years ago Primary osteoarthritis of both [place], Deborah B, MD   2 years ago Medication management   [place], [name]   2 years ago Primary osteoarthritis of both knees   [place] Elsie Stain, MD

## 2022-02-03 ENCOUNTER — Other Ambulatory Visit: Payer: Self-pay | Admitting: Internal Medicine

## 2022-02-03 ENCOUNTER — Other Ambulatory Visit: Payer: Self-pay

## 2022-02-03 DIAGNOSIS — M170 Bilateral primary osteoarthritis of knee: Secondary | ICD-10-CM

## 2022-02-03 DIAGNOSIS — I10 Essential (primary) hypertension: Secondary | ICD-10-CM

## 2022-02-03 DIAGNOSIS — E1169 Type 2 diabetes mellitus with other specified complication: Secondary | ICD-10-CM

## 2022-02-03 NOTE — Telephone Encounter (Signed)
Via interpretor, voicemail was left for pt to return call to the office to schedule an appt so her rx requests can be filled. Number given to call back to schedule.

## 2022-02-04 ENCOUNTER — Other Ambulatory Visit: Payer: Self-pay

## 2022-02-10 ENCOUNTER — Other Ambulatory Visit: Payer: Self-pay

## 2022-02-10 ENCOUNTER — Telehealth: Payer: Self-pay | Admitting: Internal Medicine

## 2022-02-10 DIAGNOSIS — I10 Essential (primary) hypertension: Secondary | ICD-10-CM

## 2022-02-10 NOTE — Telephone Encounter (Signed)
Copied from CRM [id number]. Topic: General - Other >> Feb 10, 2022 11:16 AM Everette C wrote: Reason for CRM: The patient would like to speak to a member of clinical staff about their previously requested refills  The patient is uncertain of what steps they'll need to take to refill their blood pressure medications  The patient shares that they have been without their medications for roughly a week  The patient is experiencing no symptoms at the time of call and is uncertain of why they need to see the doctor to continue their refills  Please contact the patient further when possible

## 2022-02-10 NOTE — Telephone Encounter (Signed)
Using [employer] [phone]. Patient called, left VM to return the call to the office to discuss an appointment and refill. Patient will need to be scheduled an appointment in order to receive medication refills. Last OV 06/17/21 with Dr. Laural Benes she noted to return around 10/15/21 and that appointment was not scheduled.

## 2022-02-11 ENCOUNTER — Other Ambulatory Visit: Payer: Self-pay | Admitting: Internal Medicine

## 2022-02-11 ENCOUNTER — Other Ambulatory Visit: Payer: Self-pay

## 2022-02-11 DIAGNOSIS — I10 Essential (primary) hypertension: Secondary | ICD-10-CM

## 2022-02-11 MED ORDER — AMLODIPINE BESYLATE 5 MG PO TABS
5.0000 mg | ORAL_TABLET | Freq: Every day | ORAL | 0 refills | Status: DC
  Filled 2022-02-11: qty 30, 30d supply, fill #0

## 2022-02-11 NOTE — Telephone Encounter (Signed)
LVM for pt to RTC. ----DD,RMA  

## 2022-02-11 NOTE — Telephone Encounter (Signed)
Requested medication (s) are due for refill today: Yes  Requested medication (s) are on the active medication list: Yes  Last refill:  06/17/21  Future visit scheduled:Yes  Notes to clinic:  Previous encounter on 02/10/22 sent to provider for approval of refill, routing to provider for refill since there is an appt scheduled     Requested Prescriptions  Pending Prescriptions Disp Refills   amLODipine (NORVASC) 5 MG tablet 30 tablet 6    Sig: Take 1 tablet (5 mg total) by mouth daily.     Cardiovascular: Calcium Channel Blockers 2 Failed - 02/11/2022 11:41 AM      Failed - Valid encounter within last 6 months    Recent Outpatient Visits           7 months ago Type 2 diabetes mellitus with obesity [name], MD   1 year ago Type 2 diabetes mellitus without complication, without long-term current use of insulin (HCC)   [name], MD   2 years ago Primary osteoarthritis of both knees   [name], MD   3 years ago Medication management   [name], [name]   3 years ago Primary osteoarthritis of both knees   [name], MD       Future Appointments             In 4 weeks [name]            Passed - Last BP in normal range    BP Readings from Last 1 Encounters:  06/24/21 126/88         Passed - Last Heart Rate in normal range    Pulse Readings from Last 1 Encounters:  06/17/21 88

## 2022-02-11 NOTE — Telephone Encounter (Signed)
Called, left vm to call back. 

## 2022-02-11 NOTE — Telephone Encounter (Signed)
Copied from CRM [id number]. Topic: General - Other >> Feb 11, 2022  9:21 AM Alexis Mooney wrote: Reason for CRM: The patient would like to speak with a member of clinical staff regarding their previously requested blood pressure medications  The patient is concerned with the long term effects of not taking it regularly   Please contact the patient further when possible

## 2022-02-12 ENCOUNTER — Other Ambulatory Visit: Payer: Self-pay

## 2022-02-13 NOTE — Telephone Encounter (Signed)
LVM for pt to RTC. Letter mailed to pt.----DD,RMA  

## 2022-02-19 NOTE — Telephone Encounter (Signed)
Call was made to inform patient of her amlodipine refill being approved.

## 2022-02-19 NOTE — Telephone Encounter (Signed)
Patient returning call back, not exactly sure what this is about She already knows about bp med being sent. Is the other part of call about dental referral. Please call back to confirm

## 2022-03-11 ENCOUNTER — Ambulatory Visit: Payer: Self-pay | Attending: Physician Assistant | Admitting: Physician Assistant

## 2022-03-11 ENCOUNTER — Other Ambulatory Visit: Payer: Self-pay

## 2022-03-11 VITALS — BP 106/72 | HR 74 | Temp 98.4°F | Ht 65.0 in | Wt 219.4 lb

## 2022-03-11 DIAGNOSIS — I10 Essential (primary) hypertension: Secondary | ICD-10-CM

## 2022-03-11 DIAGNOSIS — E1169 Type 2 diabetes mellitus with other specified complication: Secondary | ICD-10-CM

## 2022-03-11 DIAGNOSIS — E785 Hyperlipidemia, unspecified: Secondary | ICD-10-CM

## 2022-03-11 DIAGNOSIS — E669 Obesity, unspecified: Secondary | ICD-10-CM

## 2022-03-11 DIAGNOSIS — Z8639 Personal history of other endocrine, nutritional and metabolic disease: Secondary | ICD-10-CM

## 2022-03-11 DIAGNOSIS — M170 Bilateral primary osteoarthritis of knee: Secondary | ICD-10-CM

## 2022-03-11 DIAGNOSIS — R413 Other amnesia: Secondary | ICD-10-CM

## 2022-03-11 LAB — POCT GLYCOSYLATED HEMOGLOBIN (HGB A1C): HbA1c, POC (controlled diabetic range): 9.9 % — AB (ref 0.0–7.0)

## 2022-03-11 LAB — GLUCOSE, POCT (MANUAL RESULT ENTRY): POC Glucose: 233 mg/dl — AB (ref 70–99)

## 2022-03-11 MED ORDER — METFORMIN HCL 1000 MG PO TABS
1000.0000 mg | ORAL_TABLET | Freq: Two times a day (BID) | ORAL | 1 refills | Status: DC
  Filled 2022-03-11: qty 60, 30d supply, fill #0
  Filled 2022-04-22: qty 60, 30d supply, fill #1
  Filled 2022-07-08: qty 60, 30d supply, fill #2
  Filled 2022-09-24: qty 60, 30d supply, fill #3
  Filled 2022-10-22: qty 60, 30d supply, fill #4

## 2022-03-11 MED ORDER — ATORVASTATIN CALCIUM 40 MG PO TABS
40.0000 mg | ORAL_TABLET | Freq: Every day | ORAL | 6 refills | Status: DC
  Filled 2022-03-11: qty 30, 30d supply, fill #0
  Filled 2022-04-06: qty 30, 30d supply, fill #1
  Filled 2022-05-25: qty 30, 30d supply, fill #2
  Filled 2022-07-08: qty 30, 30d supply, fill #3
  Filled 2022-09-24: qty 30, 30d supply, fill #4
  Filled 2022-10-22: qty 30, 30d supply, fill #5

## 2022-03-11 MED ORDER — EMPAGLIFLOZIN 25 MG PO TABS
25.0000 mg | ORAL_TABLET | Freq: Every day | ORAL | 5 refills | Status: DC
  Filled 2022-03-11 – 2022-03-12 (×2): qty 30, 30d supply, fill #0
  Filled 2022-04-06: qty 30, 30d supply, fill #1

## 2022-03-11 MED ORDER — TRAMADOL HCL 50 MG PO TABS
50.0000 mg | ORAL_TABLET | Freq: Two times a day (BID) | ORAL | 0 refills | Status: DC | PRN
  Filled 2022-03-11: qty 30, 15d supply, fill #0

## 2022-03-11 MED ORDER — AMLODIPINE BESYLATE 5 MG PO TABS
5.0000 mg | ORAL_TABLET | Freq: Every day | ORAL | 0 refills | Status: DC
  Filled 2022-03-11: qty 30, 30d supply, fill #0

## 2022-03-11 MED ORDER — MELOXICAM 15 MG PO TABS
15.0000 mg | ORAL_TABLET | Freq: Every day | ORAL | 0 refills | Status: DC
  Filled 2022-03-11: qty 30, 30d supply, fill #0
  Filled 2022-04-22: qty 30, 30d supply, fill #1
  Filled 2022-07-08: qty 30, 30d supply, fill #2

## 2022-03-11 NOTE — Progress Notes (Signed)
Patient ID: Alexis Mooney, female   DOB: 03/01/1966, 56 y.o.   MRN: [id number]   Alexis Mooney, is a 56 y.o. female  OMA:[id number]  [id number]  DOB - [birth year]  Chief Complaint  Patient presents with   Diabetes    Diabetes f/u. Forgetful episodes       Subjective:   Alexis Mooney is a 56 y.o. female here today for diabetes check and med RF.  She uses tramadol sparingly.  She does not check blood sugars or follow diabetic diet.  She is compliant on current regimen.  She is c/o memory issues such as "I go to a cabinet for something and I forget what I went there for."  "I forget where I put things."  This has been occurring for several months more frequently than before it seems.     No problems updated.  ALLERGIES: Allergies  Allergen Reactions   Ampicillin Hives   Bactrim [Sulfamethoxazole-Trimethoprim] Swelling   Latex Rash    PAST MEDICAL HISTORY: Past Medical History:  Diagnosis Date   Abscess    Diabetes ([place])    High cholesterol    Hyperlipidemia associated with type 2 diabetes mellitus ([place]) 07/11/2019   Hypertension    Miscarriage    Pollen allergies     MEDICATIONS AT HOME: Prior to Admission medications   Medication Sig Start Date End Date Taking? Authorizing Provider  Blood Glucose Monitoring Suppl (TRUE METRIX METER) w/Device KIT Use as instructed. Check blood glucose level by fingerstick twice per day. 12/08/18  Yes Gildardo Pounds, NP  empagliflozin (JARDIANCE) 25 MG TABS tablet Take 1 tablet (25 mg total) by mouth daily before breakfast. 03/11/22  Yes Lamira Borin M, PA-C  glucose blood (TRUE METRIX BLOOD GLUCOSE TEST) test strip Use as instructed. Check blood glucose level by fingerstick twice per day. 01/31/19  Yes Elsie Stain, MD  loratadine (CLARITIN) 10 MG tablet Take 1 tablet (10 mg total) by mouth daily. 01/04/15  Yes Lance Bosch, NP  omeprazole (PRILOSEC) 20 MG capsule Take 1 capsule (20 mg total) by mouth  daily. 07/11/19  Yes Ladell Pier, MD  TRUEplus Lancets 28G MISC Use as instructed. Check blood glucose level by fingerstick twice per day. 01/31/19  Yes Elsie Stain, MD  amLODipine (NORVASC) 5 MG tablet Take 1 tablet (5 mg total) by mouth daily. 03/11/22   Argentina Donovan, PA-C  atorvastatin (LIPITOR) 40 MG tablet Take 1 tablet (40 mg total) by mouth daily. 03/11/22   Argentina Donovan, PA-C  meloxicam (MOBIC) 15 MG tablet Take 1 tablet (15 mg total) by mouth daily. 03/11/22   Argentina Donovan, PA-C  metFORMIN (GLUCOPHAGE) 1000 MG tablet Take 1 tablet (1,000 mg total) by mouth 2 (two) times daily with a meal. 03/11/22   Geoffrey Mankin, Dionne Bucy, PA-C  traMADol (ULTRAM) 50 MG tablet Take 1 tablet (50 mg total) by mouth every 12 (twelve) hours as needed. Use sparingly 03/11/22   Argentina Donovan, PA-C    ROS: Neg HEENT Neg resp Neg cardiac Neg GI Neg GU Neg psych   Objective:   Vitals:   03/11/22 1100  BP: 106/72  Pulse: 74  Temp: 98.4 F (36.9 C)  TempSrc: Oral  SpO2: 97%  Weight: 219 lb 6.4 oz (99.5 kg)  Height: _0  (1.651 m)   Exam General appearance : Awake, alert, not in any distress. Speech Clear. Not toxic looking HEENT: Atraumatic and Normocephalic Neck: Supple, no JVD. No cervical lymphadenopathy.  Chest:  Good air entry bilaterally, CTAB.  No rales/rhonchi/wheezing CVS: S1 S2 regular, no murmurs.  Extremities: B/L Lower Ext shows no edema, both legs are warm to touch Neurology: Awake alert, and oriented X 3, CN II-XII intact, Non focal Skin: No Rash  MMSE =28 today  Data Review Lab Results  Component Value Date   HGBA1C 9.9 (A) 03/11/2022   HGBA1C 10.9 (A) 06/17/2021   HGBA1C 8.5 (A) 05/23/2020    Assessment & Plan   1. Type 2 diabetes mellitus with obesity ([place]) Uncontrolled but better.  Will add jardiance and f/up Luke in 3-4 weeks - Glucose (CBG) - HgB A1c - empagliflozin (JARDIANCE) 25 MG TABS tablet; Take 1 tablet (25 mg total) by mouth daily  before breakfast.  Dispense: 30 tablet; Refill: 5 - Comprehensive metabolic panel - metFORMIN (GLUCOPHAGE) 1000 MG tablet; Take 1 tablet (1,000 mg total) by mouth 2 (two) times daily with a meal.  Dispense: 180 tablet; Refill: 1  2. Primary osteoarthritis of both knees - meloxicam (MOBIC) 15 MG tablet; Take 1 tablet (15 mg total) by mouth daily.  Dispense: 90 tablet; Refill: 0 - traMADol (ULTRAM) 50 MG tablet; Take 1 tablet (50 mg total) by mouth every 12 (twelve) hours as needed. Use sparingly  Dispense: 30 tablet; Refill: 0  3. Essential hypertension controlled - Comprehensive metabolic panel - amLODipine (NORVASC) 5 MG tablet; Take 1 tablet (5 mg total) by mouth daily.  Dispense: 30 tablet; Refill: 0  4. Hyperlipidemia associated with type 2 diabetes mellitus ([facility]) - Comprehensive metabolic panel - atorvastatin (LIPITOR) 40 MG tablet; Take 1 tablet (40 mg total) by mouth daily.  Dispense: 30 tablet; Refill: 6  5. History of hypothyroidism - Thyroid Panel With TSH  6. Memory changes MMSE=28 today.  Can try gingko biloba for memory supplement - Thyroid Panel With TSH    Return for 1 month with [facility] for diabetes and 3 months with PCP.  The patient was given clear instructions to go to ER or return to medical center if symptoms don't improve, worsen or new problems develop. The patient verbalized understanding. The patient was told to call to get lab results if they haven't heard anything in the next week.      Freeman Caldron, PA-C [facility] and [facility] [place], [place]   03/11/2022, 11:39 AM

## 2022-03-11 NOTE — Patient Instructions (Addendum)
Gingko biloba for memory health.  Drink 80-100 ounces water daily  Check blood sugars fasting and at bed time and bring to next visit

## 2022-03-12 ENCOUNTER — Other Ambulatory Visit: Payer: Self-pay

## 2022-03-12 LAB — THYROID PANEL WITH TSH
Free Thyroxine Index: 2.2 (ref 1.2–4.9)
T3 Uptake Ratio: 25 % (ref 24–39)
T4, Total: 8.6 ug/dL (ref 4.5–12.0)
TSH: 1.72 u[IU]/mL (ref 0.450–4.500)

## 2022-03-12 LAB — COMPREHENSIVE METABOLIC PANEL
ALT: 25 IU/L (ref 0–32)
AST: 21 IU/L (ref 0–40)
Albumin/Globulin Ratio: 1.7 (ref 1.2–2.2)
Albumin: 4.2 g/dL (ref 3.8–4.9)
Alkaline Phosphatase: 100 IU/L (ref 44–121)
BUN/Creatinine Ratio: 22 (ref 9–23)
BUN: 11 mg/dL (ref 6–24)
Bilirubin Total: 0.5 mg/dL (ref 0.0–1.2)
CO2: 22 mmol/L (ref 20–29)
Calcium: 9.3 mg/dL (ref 8.7–10.2)
Chloride: 96 mmol/L (ref 96–106)
Creatinine, Ser: 0.51 mg/dL — ABNORMAL LOW (ref 0.57–1.00)
Globulin, Total: 2.5 g/dL (ref 1.5–4.5)
Glucose: 195 mg/dL — ABNORMAL HIGH (ref 70–99)
Potassium: 4.2 mmol/L (ref 3.5–5.2)
Sodium: 137 mmol/L (ref 134–144)
Total Protein: 6.7 g/dL (ref 6.0–8.5)
eGFR: 110 mL/min/{1.73_m2} (ref >59)

## 2022-03-17 ENCOUNTER — Other Ambulatory Visit: Payer: Self-pay

## 2022-04-06 ENCOUNTER — Other Ambulatory Visit: Payer: Self-pay

## 2022-04-06 ENCOUNTER — Other Ambulatory Visit: Payer: Self-pay | Admitting: Physician Assistant

## 2022-04-06 DIAGNOSIS — I10 Essential (primary) hypertension: Secondary | ICD-10-CM

## 2022-04-07 ENCOUNTER — Other Ambulatory Visit: Payer: Self-pay

## 2022-04-07 MED ORDER — AMLODIPINE BESYLATE 5 MG PO TABS
5.0000 mg | ORAL_TABLET | Freq: Every day | ORAL | 0 refills | Status: DC
  Filled 2022-04-07: qty 30, 30d supply, fill #0

## 2022-04-07 NOTE — Telephone Encounter (Signed)
Requested Prescriptions  Pending Prescriptions Disp Refills  . amLODipine (NORVASC) 5 MG tablet 30 tablet 0    Sig: Take 1 tablet (5 mg total) by mouth daily. ( keep upcoming appointment for additional refills)     Cardiovascular: Calcium Channel Blockers 2 Passed - 04/06/2022 12:34 PM      Passed - Last BP in normal range    BP Readings from Last 1 Encounters:  03/11/22 106/72         Passed - Last Heart Rate in normal range    Pulse Readings from Last 1 Encounters:  03/11/22 74         Passed - Valid encounter within last 6 months    Recent Outpatient Visits          3 weeks ago Type 2 diabetes mellitus with obesity [facility])   [place] Arnold, [place], [place]   9 months ago Type 2 diabetes mellitus with obesity [facility])   [place], Deborah B, MD   1 year ago Type 2 diabetes mellitus without complication, without long-term current use of insulin ([place])   [place], MD   2 years ago Primary osteoarthritis of both [place], Deborah B, MD   3 years ago Medication management   [place], Jarome Matin, [name]      Future Appointments            In 1 week Daisy Blossom, Jarome Matin, [place]   In 2 months Wynetta Emery, Dalbert Batman, MD [name]

## 2022-04-17 ENCOUNTER — Ambulatory Visit: Payer: No Typology Code available for payment source | Admitting: Pharmacist

## 2022-04-20 ENCOUNTER — Other Ambulatory Visit: Payer: Self-pay

## 2022-04-22 ENCOUNTER — Other Ambulatory Visit: Payer: Self-pay

## 2022-04-22 ENCOUNTER — Telehealth: Payer: Self-pay | Admitting: Internal Medicine

## 2022-04-22 ENCOUNTER — Ambulatory Visit: Payer: Self-pay

## 2022-04-22 MED ORDER — SITAGLIPTIN PHOSPHATE 50 MG PO TABS
50.0000 mg | ORAL_TABLET | Freq: Every day | ORAL | 4 refills | Status: DC
  Filled 2022-04-22 – 2022-05-25 (×2): qty 30, 30d supply, fill #0

## 2022-04-22 MED ORDER — FLUCONAZOLE 150 MG PO TABS
150.0000 mg | ORAL_TABLET | Freq: Every day | ORAL | 0 refills | Status: DC
  Filled 2022-04-22: qty 2, 2d supply, fill #0

## 2022-04-22 NOTE — Telephone Encounter (Signed)
Pt calling back b/c she has not heard anything back from her nurse triage call this morning.  Pt thinks it is the new DM medicine she was put on b/c the vaginal itching started after taking this med. For now she is going to stop the medication, now needs to know what to do. empagliflozin (JARDIANCE) 25 MG TABS tablet   This what she thinks is causing this issue. Pt states it is very very bad itching.  Pt is going to work at 3 pm. She cannot have her phone w/ her at work.  She will call at her break at 5:45 pm to see if Dr has answered this.  She says she cannot take the itching anymore.  She is scratching so much she is making herself bleed a little.

## 2022-04-22 NOTE — Telephone Encounter (Signed)
Pt has been informed of medication being sent to pharmacy and medication change.

## 2022-04-22 NOTE — Telephone Encounter (Signed)
Message from [place] sent at 04/22/2022  9:57 AM EST  Summary: severe vaginal itching/2 weeks   Pt stated that for the past two weeks, she has been experiencing severe vaginal itching. Pt stated that she has tried creams, OTC, and feels that this has only made things worse.  No appointments available.  Pt seeking clinical advice.         Chief Complaint: severe itching Symptoms: yellow discharge, foul odor, burns area when voids Frequency: 2 weeks Pertinent Negatives: Patient denies fever, bleeding,  Disposition: [] ED /[x] Urgent Care (no appt availability in office) / [] Appointment(In office/virtual)/ []  [place] Virtual [facility]/ [] Home Care/ [] Refused Recommended Disposition /[] [place] [facility]/ []  Follow-up with PCP Additional Notes: pt has tried OTC meds and no effect  Reason for Disposition  Bad smelling vaginal discharge  Answer Assessment - Initial Assessment Questions 1. DISCHARGE: "Describe the discharge." (e.g., white, yellow, green, gray, foamy, cottage cheese-like)     yellow 2. ODOR: "Is there a bad odor?"     yes 3. ONSET: "When did the discharge begin?"     2 weeks 4. RASH: "Is there a rash in the genital area?" If Yes, ask: "Describe it." (e.g., redness, blisters, sores, bumps)     N/a 5. ABDOMEN PAIN: "Are you having any abdomen pain?" If Yes, ask: "What does it feel like? " (e.g., crampy, dull, intermittent, constant)      Mild cramping 6. ABDOMEN PAIN SEVERITY: If present, ask: "How bad is it?" (e.g., Scale 1-10; mild, moderate, or severe)   - MILD (1-3): Doesn't interfere with normal activities, abdomen soft and not tender to touch.    - MODERATE (4-7): Interferes with normal activities or awakens from sleep, abdomen tender to touch.    - SEVERE (8-10): Excruciating pain, doubled over, unable to do any normal activities. (R/O peritonitis)      mild 7. CAUSE: "What do you think is causing the discharge?" "Have you had the same problem before? What  happened then?"     Yeast infection 8. OTHER SYMPTOMS: "Do you have any other symptoms?" (e.g., fever, itching, vaginal bleeding, pain with urination, injury to genital area, vaginal foreign body)     Severe itching 9. PREGNANCY: "Is there any chance you are pregnant?" "When was your last menstrual period?"     N/a  Protocols used: Vaginal Discharge-A-AH

## 2022-04-22 NOTE — Telephone Encounter (Signed)
Duplicate message. 

## 2022-04-22 NOTE — Telephone Encounter (Signed)
Message from [place] sent at 04/22/2022  9:57 AM EST  Summary: severe vaginal itching/2 weeks   Pt stated that for the past two weeks, she has been experiencing severe vaginal itching. Pt stated that she has tried creams, OTC, and feels that this has only made things worse.  No appointments available.  Pt seeking clinical advice.        Using Spanish interpreter called pt and LM on VM to call back to discuss.Asked for female interpreter.

## 2022-04-22 NOTE — Telephone Encounter (Signed)
Routing to PCP for review.

## 2022-04-23 ENCOUNTER — Other Ambulatory Visit: Payer: Self-pay

## 2022-04-27 ENCOUNTER — Other Ambulatory Visit: Payer: Self-pay

## 2022-04-27 ENCOUNTER — Telehealth: Payer: Self-pay | Admitting: Internal Medicine

## 2022-04-27 MED ORDER — GLIPIZIDE 5 MG PO TABS
5.0000 mg | ORAL_TABLET | Freq: Two times a day (BID) | ORAL | 1 refills | Status: DC
  Filled 2022-04-27: qty 60, 30d supply, fill #0
  Filled 2022-05-25: qty 60, 30d supply, fill #1

## 2022-04-27 NOTE — Telephone Encounter (Signed)
Phone call placed to patient this morning. Advised patient that we have recommended stopping the Jardiance due to the vaginal itching that she has been experiencing since starting the medicine.  Patient states she has stopped the medication.  I tried to change her to [facility] but was informed by Lorri Frederick in our pharmacy that it would take at least a month to get this through the patient assistance program provided she is approved. I touch base with our clinical pharmacist about whether a sulfonylurea would be safe given she had allergy to Bactrim.  I was told that the glipizide has an active metabolite and as long as she did not experience an anaphylaxis with Bactrim, we should be safe to use glipizide starting at a low dose. I spoke with patient this morning about this.  She is willing to try the glipizide.  I told her that if she experiences any swelling or rash or itching with glipizide, she should stop the medicine and let me know immediately.  She expressed understanding.

## 2022-05-01 ENCOUNTER — Other Ambulatory Visit: Payer: Self-pay

## 2022-05-18 ENCOUNTER — Other Ambulatory Visit: Payer: Self-pay | Admitting: Obstetrics and Gynecology

## 2022-05-18 DIAGNOSIS — Z1231 Encounter for screening mammogram for malignant neoplasm of breast: Secondary | ICD-10-CM

## 2022-05-25 ENCOUNTER — Other Ambulatory Visit: Payer: Self-pay | Admitting: Internal Medicine

## 2022-05-25 ENCOUNTER — Other Ambulatory Visit: Payer: Self-pay

## 2022-05-25 DIAGNOSIS — I10 Essential (primary) hypertension: Secondary | ICD-10-CM

## 2022-05-25 MED ORDER — AMLODIPINE BESYLATE 5 MG PO TABS
5.0000 mg | ORAL_TABLET | Freq: Every day | ORAL | 0 refills | Status: DC
  Filled 2022-05-25: qty 30, 30d supply, fill #0

## 2022-05-26 ENCOUNTER — Other Ambulatory Visit: Payer: Self-pay

## 2022-06-25 ENCOUNTER — Ambulatory Visit: Payer: Self-pay | Admitting: Internal Medicine

## 2022-07-02 ENCOUNTER — Ambulatory Visit: Payer: No Typology Code available for payment source

## 2022-07-08 ENCOUNTER — Other Ambulatory Visit: Payer: Self-pay | Admitting: Internal Medicine

## 2022-07-08 ENCOUNTER — Other Ambulatory Visit: Payer: Self-pay

## 2022-07-08 DIAGNOSIS — I10 Essential (primary) hypertension: Secondary | ICD-10-CM

## 2022-07-13 ENCOUNTER — Other Ambulatory Visit: Payer: Self-pay

## 2022-07-14 ENCOUNTER — Ambulatory Visit: Payer: No Typology Code available for payment source

## 2022-07-16 ENCOUNTER — Ambulatory Visit: Payer: Self-pay | Admitting: Hematology and Oncology

## 2022-07-16 ENCOUNTER — Ambulatory Visit
Admission: RE | Admit: 2022-07-16 | Discharge: 2022-07-16 | Disposition: A | Discharge status: Home / Self Care | Payer: No Typology Code available for payment source | Source: Ambulatory Visit | Attending: Obstetrics and Gynecology | Admitting: Obstetrics and Gynecology

## 2022-07-16 VITALS — BP 138/95 | Wt 218.0 lb

## 2022-07-16 DIAGNOSIS — Z1211 Encounter for screening for malignant neoplasm of colon: Secondary | ICD-10-CM

## 2022-07-16 DIAGNOSIS — Z1239 Encounter for other screening for malignant neoplasm of breast: Secondary | ICD-10-CM

## 2022-07-16 DIAGNOSIS — Z1231 Encounter for screening mammogram for malignant neoplasm of breast: Secondary | ICD-10-CM

## 2022-07-16 NOTE — Patient Instructions (Signed)
Taught [name] about breast self awareness and gave educational materials to take home. Patient did not need a Pap smear today due to last Pap smear was in 2019 per patient. She will be due in October 2024.  Let her know [facility] will cover Pap smears every 5 years unless has a history of abnormal Pap smears. Referred patient to the [facility] for screening mammogram. Appointment scheduled for 07/16/22. Patient aware of appointment and will be there. Let patient know will follow up with her within the next couple weeks with results. Tamya Goyzueta-Hurtado verbalized understanding.  Melodye Ped, NP 11:22 AM

## 2022-07-16 NOTE — Progress Notes (Signed)
Alexis Mooney is a 57 y.o. female who presents to [facility] clinic today with no complaints.    Pap Smear: Pap not smear completed today. Last Pap smear was 03/2018 and was normal. Per patient has no history of an abnormal Pap smear. Last Pap smear result is available in Epic.   Physical exam: Breasts Breasts symmetrical. No skin abnormalities bilateral breasts. No nipple retraction bilateral breasts. No nipple discharge bilateral breasts. No lymphadenopathy. No lumps palpated bilateral breasts.     MS DIGITAL DIAG TOMO UNI LEFT  Result Date: 07/07/2021 CLINICAL DATA:  57 year old female presenting as a recall from screening for possible left breast asymmetry. EXAM: DIGITAL DIAGNOSTIC UNILATERAL LEFT MAMMOGRAM WITH TOMOSYNTHESIS AND CAD TECHNIQUE: Left digital diagnostic mammography and breast tomosynthesis was performed. The images were evaluated with computer-aided detection. COMPARISON:  Previous exam(s). ACR Breast Density Category c: The breast tissue is heterogeneously dense, which may obscure small masses. FINDINGS: Spot compression tomosynthesis MLO and full field mL tomosynthesis views of the left breast were performed for a questioned asymmetry seen only on MLO view in the retroareolar left breast. On the additional imaging the tissue in this area disperses without persistent asymmetry, mass, or distortion. There are no new findings elsewhere in the left breast. IMPRESSION: No mammographic evidence of malignancy in the left breast. RECOMMENDATION: Screening mammogram in one year.(Code:SM-B-01Y) I have discussed the findings and recommendations with the patient. If applicable, a reminder letter will be sent to the patient regarding the next appointment. BI-RADS CATEGORY  1: Negative. Electronically Signed   By: Audie Pinto M.D.   On: 07/07/2021 09:42   MM 3D SCREEN BREAST BILATERAL  Result Date: 06/25/2021 CLINICAL DATA:  Screening. EXAM: DIGITAL SCREENING BILATERAL MAMMOGRAM  WITH TOMOSYNTHESIS AND CAD TECHNIQUE: Bilateral screening digital craniocaudal and mediolateral oblique mammograms were obtained. Bilateral screening digital breast tomosynthesis was performed. The images were evaluated with computer-aided detection. COMPARISON:  Previous exam(s). ACR Breast Density Category c: The breast tissue is heterogeneously dense, which may obscure small masses. FINDINGS: In the left breast, a possible asymmetry warrants further evaluation. In the right breast, no findings suspicious for malignancy. IMPRESSION: Further evaluation is suggested for possible asymmetry in the left breast. RECOMMENDATION: Diagnostic mammogram and possibly ultrasound of the left breast. (Code:FI-L-95M) The patient will be contacted regarding the findings, and additional imaging will be scheduled. BI-RADS CATEGORY  0: Incomplete. Need additional imaging evaluation and/or prior mammograms for comparison. Electronically Signed   By: Abelardo Diesel M.D.   On: 06/25/2021 13:55   MS DIGITAL DIAG TOMO UNI LEFT  Result Date: 07/05/2020 CLINICAL DATA:  Possible asymmetry in the outer left breast in the craniocaudal projection of the recent screening mammogram. EXAM: DIGITAL DIAGNOSTIC UNILATERAL LEFT MAMMOGRAM WITH TOMO AND CAD TECHNIQUE: Left digital diagnostic mammography and breast tomosynthesis was performed. Digital images of the breasts were evaluated with computer-aided detection. COMPARISON:  Previous exam(s). ACR Breast Density Category c: The breast tissue is heterogeneously dense, which may obscure small masses. FINDINGS: 3D tomographic and 2D generated true lateral and spot compression craniocaudal images of the left breast demonstrate normal appearing fibroglandular tissue at the location of the recently suspected asymmetry, unchanged compared previous examinations. IMPRESSION: No evidence of malignancy. The recently suspected left breast asymmetry was close apposition of normal breast tissue.  RECOMMENDATION: Bilateral screening mammogram in 1 year. I have discussed the findings and recommendations with the patient. If applicable, a reminder letter will be sent to the patient regarding the next appointment. BI-RADS CATEGORY  1: Negative. Electronically Signed   By: Claudie Revering M.D.   On: 07/05/2020 09:44   MS DIGITAL SCREENING TOMO BILATERAL  Result Date: 06/14/2020 CLINICAL DATA:  Screening. EXAM: DIGITAL SCREENING BILATERAL MAMMOGRAM WITH TOMO AND CAD COMPARISON:  Previous exam(s). ACR Breast Density Category c: The breast tissue is heterogeneously dense, which may obscure small masses. FINDINGS: In the left breast, a possible asymmetry warrants further evaluation. In the right breast, no findings suspicious for malignancy. Images were processed with CAD. IMPRESSION: Further evaluation is suggested for possible asymmetry in the left breast. RECOMMENDATION: Diagnostic mammogram and possibly ultrasound of the left breast. (Code:FI-L-20M) The patient will be contacted regarding the findings, and additional imaging will be scheduled. BI-RADS CATEGORY  0: Incomplete. Need additional imaging evaluation and/or prior mammograms for comparison. Electronically Signed   By: Marin Olp M.D.   On: 06/14/2020 07:53   MS DIGITAL DIAG TOMO BILAT  Result Date: 05/02/2019 CLINICAL DATA:  57 year old female with history of infected/inflamed sebaceous cysts presents with palpable area of concern in the left axilla which she no longer feels. EXAM: DIGITAL DIAGNOSTIC BILATERAL MAMMOGRAM WITH CAD AND TOMO BILATERAL BREAST ULTRASOUND COMPARISON:  Previous exam(s). ACR Breast Density Category c: The breast tissue is heterogeneously dense, which may obscure small masses. FINDINGS: There is an asymmetry within the upper slightly inner right breast measuring approximately 0.8 cm. No suspicious masses or calcifications are seen in the left breast. Mammographic images were processed with CAD. Targeted ultrasound of the  upper and on upper inner right breast was performed demonstrating several small cysts and clusters of cysts. A cluster of cysts at 1 o'clock 6 cm from nipple measures 0.4 x 0.2 x 0.8 cm. This is felt to correspond well with the asymmetry within the upper slightly inner right breast at mammography. Targeted ultrasound of the left axilla was performed. No suspicious masses or abnormality seen, only scarring related to patient's prior excision in this location. IMPRESSION: No findings of malignancy in either breast. RECOMMENDATION: Screening mammogram in one year.(Code:SM-B-01Y) I have discussed the findings and recommendations with the patient. If applicable, a reminder letter will be sent to the patient regarding the next appointment. BI-RADS CATEGORY  2: Benign. Electronically Signed   By: Everlean Alstrom M.D.   On: 05/02/2019 10:41   MM DIAG BREAST TOMO BILATERAL  Addendum Date: 03/08/2018   ADDENDUM REPORT: 03/08/2018 16:41 ADDENDUM: Addendum for addition of recommendation #2: Screening mammogram in one year.(Code:SM-B-01Y) Electronically Signed   By: Lovey Newcomer M.D.   On: 03/08/2018 16:41   Result Date: 03/08/2018 CLINICAL DATA:  Patient presents for palpable abnormality within the median aspect of the chest. EXAM: DIGITAL DIAGNOSTIC BILATERAL MAMMOGRAM WITH CAD AND TOMO ULTRASOUND RIGHT BREAST COMPARISON:  Previous exam(s). ACR Breast Density Category c: The breast tissue is heterogeneously dense, which may obscure small masses. FINDINGS: No concerning masses, calcifications or distortion identified within either breast. Mammographic images were processed with CAD. On physical exam, there is a small inflamed mass within the central aspect of the chest wall. Targeted ultrasound is performed, showing a 1.9 x 2.3 x 0.6 cm predominantly solid mass with internal color vascularity with thick internal fluid right breast 3 o'clock position 14 cm from the nipple. IMPRESSION: Palpable abnormality corresponds  within inflamed sebaceous cyst. RECOMMENDATION: Surgical consultation for removal of recurrent sebaceous cyst central right breast. I have discussed the findings and recommendations with the patient. Results were also provided in writing at the conclusion of the visit. If applicable, a  reminder letter will be sent to the patient regarding the next appointment. BI-RADS CATEGORY  2: Benign. Electronically Signed: By: Lovey Newcomer M.D. On: 03/08/2018 14:03      Pelvic/Bimanual Pap is not indicated today    Smoking History: Patient has is a former smoker and was not referred to quit line.    Patient Navigation: Patient education provided. Access to services provided for patient through [facility] program. No interpreter provided. No transportation provided   Colorectal Cancer Screening: Per patient has never had colonoscopy completed No complaints today. FIT test given.   Breast and Cervical Cancer Risk Assessment: Patient does not have family history of breast cancer, known genetic mutations, or radiation treatment to the chest before age 89. Patient does not have history of cervical dysplasia, immunocompromised, or DES exposure in-utero.  Risk Scores as of 07/16/2022     Alexis Mooney           5-year 0.96 %   Lifetime 6.29 %   This patient is [place] but has no documented birth country, so the [facility] used data from [place] patients to calculate their risk score. Document a birth country in the Demographics activity for a more accurate score.         Last calculated by Claretha Cooper, CMA on 07/16/2022 at  9:58 AM        A: [facility] exam without pap smear No complaints with benign exam.   P: Referred patient to the [place] for a screening mammogram. Appointment scheduled 07/16/22.  Melodye Ped, NP 07/16/2022 11:18 AM

## 2022-07-29 ENCOUNTER — Other Ambulatory Visit: Payer: Self-pay | Admitting: Internal Medicine

## 2022-07-29 ENCOUNTER — Other Ambulatory Visit: Payer: Self-pay

## 2022-07-29 DIAGNOSIS — I10 Essential (primary) hypertension: Secondary | ICD-10-CM

## 2022-07-29 MED ORDER — GLIPIZIDE 5 MG PO TABS
5.0000 mg | ORAL_TABLET | Freq: Two times a day (BID) | ORAL | 2 refills | Status: DC
  Filled 2022-07-29: qty 60, 30d supply, fill #0
  Filled 2022-09-24: qty 60, 30d supply, fill #1
  Filled 2022-10-22: qty 60, 30d supply, fill #2

## 2022-07-29 MED ORDER — AMLODIPINE BESYLATE 5 MG PO TABS
5.0000 mg | ORAL_TABLET | Freq: Every day | ORAL | 2 refills | Status: DC
  Filled 2022-07-29: qty 30, 30d supply, fill #0
  Filled 2022-09-24: qty 30, 30d supply, fill #1
  Filled 2022-10-22: qty 30, 30d supply, fill #2

## 2022-09-22 LAB — HEMOGLOBIN A1C: Hemoglobin A1C: 11.1

## 2022-09-22 LAB — FECAL OCCULT BLOOD, IMMUNOCHEMICAL

## 2022-09-22 LAB — GLUCOSE, POCT (MANUAL RESULT ENTRY): Glucose Fasting, POC: 273 mg/dL — AB (ref 70–99)

## 2022-09-24 ENCOUNTER — Other Ambulatory Visit: Payer: Self-pay

## 2022-09-24 ENCOUNTER — Encounter: Payer: Self-pay | Admitting: Physician Assistant

## 2022-09-24 ENCOUNTER — Ambulatory Visit: Payer: Self-pay

## 2022-09-24 ENCOUNTER — Telehealth: Payer: Self-pay | Admitting: Physician Assistant

## 2022-09-24 VITALS — BP 114/82 | HR 96 | Wt 215.0 lb

## 2022-09-24 DIAGNOSIS — L02222 Furuncle of back [any part, except buttock]: Secondary | ICD-10-CM

## 2022-09-24 MED ORDER — DOXYCYCLINE HYCLATE 100 MG PO CAPS
100.0000 mg | ORAL_CAPSULE | Freq: Two times a day (BID) | ORAL | 0 refills | Status: DC
  Filled 2022-09-24: qty 20, 10d supply, fill #0

## 2022-09-24 NOTE — Telephone Encounter (Signed)
     Chief Complaint: Boil to middle of back. Very painful, red. Symptoms: Above Frequency: 3 days ago Pertinent Negatives: Patient denies  Disposition: ED /[] Urgent Care (no appt availability in office) / Appointment(In office/virtual)/  [place] Virtual Care/ Home Care/ Refused Recommended Disposition /[x] [place] Mobile Bus/  Follow-up with PCP Additional Notes:   Reason for Disposition  Boil > 2 inches across (> 5 cm; larger than a golf ball or ping pong ball)  Answer Assessment - Initial Assessment Questions 1. APPEARANCE of BOIL: "What does the boil look like?"      Red 2. LOCATION: "Where is the boil located?"      Middle of back 3. NUMBER: "How many boils are there?"      1 4. SIZE: "How big is the boil?" (e.g., inches, cm; compare to size of a coin or other object)     2 inches 5. ONSET: "When did the boil start?"     3 days ago 6. PAIN: "Is there any pain?" If Yes, ask: "How bad is the pain?"   (Scale 1-10; or mild, moderate, severe)     Severe 7. FEVER: "Do you have a fever?" If Yes, ask: "What is it, how was it measured, and when did it start?"      No 8. SOURCE: "Have you been around anyone with boils or other Staph infections?" "Have you ever had boils before?"     No 9. OTHER SYMPTOMS: "Do you have any other symptoms?" (e.g., shaking chills, weakness, rash elsewhere on body)     No 10. PREGNANCY: "Is there any chance you are pregnant?" "When was your last menstrual period?"       No  Protocols used: Boil (Skin Abscess)-A-AH

## 2022-09-24 NOTE — Patient Instructions (Signed)
You are going to take doxycycline twice a day for 10 days.  Keep the area clean and dry, it is okay to use warm compresses and the over-the-counter antibiotic cream that she purchased.  Please return to the mobile unit in approximately 2 weeks.  We will call you and let you know where we are going to be located.  Please bring your blood sugar readings and your medications with you to that visit.  Roney Jaffe, PA-C Physician Assistant [facility] [email or web address]   Skin Abscess  A skin abscess is an infected area on or under your skin. It contains pus and other material. An abscess may also be called a furuncle, carbuncle, or boil. It is often the result of an infection caused by bacteria. An abscess can occur in or on almost any part of your body. Sometimes, an abscess may break open (rupture) on its own. In most cases, it will keep getting worse unless it is treated. An abscess can cause pain and make you feel ill. An untreated abscess can cause infection to spread to other parts of your body or your bloodstream. The abscess may need to be drained. You may also need to take antibiotics. What are the causes? An abscess occurs when germs, like bacteria, pass through your skin and cause an infection. This may be caused by: A scrape or cut on your skin. A puncture wound through your skin, such as a needle injection or insect bite. Blocked oil or sweat glands. Blocked and infected hair follicles. A fluid-filled sac that forms beneath your skin (sebaceous cyst) and becomes infected. What increases the risk? You may be more likely to develop an abscess if: You have problems with blood circulation, or you have a weak body defense system (immune system). You have diabetes. You have dry and irritated skin. You get injections often or use IV drugs. You have a foreign body in a wound, such as a splinter. You smoke or use tobacco  products. What are the signs or symptoms? Symptoms of this condition include: A painful, firm bump under the skin. A bump with pus at the top. This may break through the skin and drain. Other symptoms include: Redness and swelling around the abscess. Warmth or tenderness. Swelling of the lymph nodes (glands) near the abscess. A sore on the skin. How is this diagnosed? This condition may be diagnosed based on a physical exam and your medical history. You may also have tests done, such as: A test of a sample of pus. This may be done to find what is causing the infection. Blood tests. Imaging tests, such as an ultrasound, CT scan, or MRI. How is this treated? A small abscess that drains on its own may not need to be treated. Treatment for larger abscesses may include: Moist heat or a heat pack applied to the area a few times a day. Incision and drainage. This is a procedure to drain the abscess. Antibiotics. For a severe abscess, you may first get antibiotics through an IV and then change to antibiotics by mouth. Follow these instructions at home: Medicines Take over-the-counter and prescription medicines only as told by your provider. If you were prescribed antibiotics, take them as told by your provider. Do not stop using the antibiotic even if you start to feel better. Abscess care  If you have an abscess that has not drained, apply heat to the affected area. Use the heat source that your provider recommends, such as a moist  heat pack or a heating pad. Place a towel between your skin and the heat source. Leave the heat on for 20-30 minutes at a time. If your skin turns bright red, remove the heat right away to prevent burns. The risk of burns is higher if you cannot feel pain, heat, or cold. Follow instructions from your provider about how to take care of your abscess. Make sure you: Cover the abscess with a bandage (dressing). Wash your hands with soap and water for at least 20  seconds before and after you change the dressing or gauze. If soap and water are not available, use hand sanitizer. Change your dressing or gauze as told by your provider. Check your abscess every day for signs of an infection that is getting worse. Check for: More redness, swelling, pain, or tenderness. More fluid or blood. Warmth. More pus or a worse smell. General instructions To avoid spreading the infection: Do not share personal care items, towels, or hot tubs with others. Avoid making skin contact with other people. Be careful when getting rid of used dressings, wound packing, or any drainage from the abscess. Do not use any products that contain nicotine or tobacco. These products include cigarettes, chewing tobacco, and vaping devices, such as e-cigarettes. If you need help quitting, ask your provider. Do not use any creams, ointments, or liquids unless you have been told to by your provider. Contact a health care provider if: You see redness that spreads quickly or red streaks on your skin spreading away from the abscess. You have any signs of worse infection at the abscess. You vomit every time you eat or drink. You have a fever, chills, or muscle aches. The cyst or abscess returns. Get help right away if: You have severe pain. You make less pee (urine) than normal. This information is not intended to replace advice given to you by your health care provider. Make sure you discuss any questions you have with your health care provider. Document Revised: 01/07/2022 Document Reviewed: 01/07/2022 Elsevier Patient Education  2023 [employer].

## 2022-09-24 NOTE — Progress Notes (Signed)
Established Patient Office Visit  Subjective   Patient ID: Alexis Mooney, female    DOB: 11-10-1965  Age: 57 y.o. MRN: [id number]  Virtual Visit via Video Note  I connected with Alexis Mooney on 09/24/22 at 12:00 PM EDT by a video enabled telemedicine application and verified that I am speaking with the correct person using two identifiers.  Location: Patient: [facility] Mobile Medicine Unit  Provider: Remote Office   I discussed the limitations of evaluation and management by telemedicine and the availability of in person appointments. The patient expressed understanding and agreed to proceed.  Chief Complaint  Patient presents with   Recurrent Skin Infections    History of Present Illness: States that she started having a painful bump on her back, right above her bra line in the middle.  States this is started approximately 1 week ago.  States that she has felt feverish, but has not measured her temperature.  States that she has tried putting an antibiotic ointment that she purchased over-the-counter on it without relief. States that she has not had a boil in her armpit in the past but states this was many years ago.      Observations/Objective: Medical history and current medications reviewed, no physical exam completed        Past Medical History:  Diagnosis Date   Abscess    Diabetes    High cholesterol    Hyperlipidemia associated with type 2 diabetes mellitus 07/11/2019   Hypertension    Miscarriage    Pollen allergies    Social History   Socioeconomic History   Marital status: Single    Spouse name: Not on file   Number of children: 0   Years of education: Not on file   Highest education level: Some college, no degree  Occupational History   Not on file  Tobacco Use   Smoking status: Former    Types: Cigarettes    Quit date: 09/24/1994    Years since quitting: 28.0   Smokeless tobacco: Never  Vaping Use   Vaping Use: Never used   Substance and Sexual Activity   Alcohol use: Yes    Comment: rarely   Drug use: No   Sexual activity: Yes    Birth control/protection: None, Post-menopausal  Other Topics Concern   Not on file  Social History Narrative   Not on file   Social Determinants of Health   Financial Resource Strain: Not on file  Food Insecurity: No Food Insecurity (07/16/2022)   Hunger Vital Sign    Worried About Running Out of Food in the Last Year: Never true    Ran Out of Food in the Last Year: Never true  Transportation Needs: No Transportation Needs (07/16/2022)   PRAPARE - Administrator, Civil Service (Medical): No    Lack of Transportation (Non-Medical): No  Physical Activity: Not on file  Stress: Not on file  Social Connections: Not on file  Intimate Partner Violence: Not At Risk (09/24/2022)   Humiliation, Afraid, Rape, and Kick questionnaire    Fear of Current or Ex-Partner: No    Emotionally Abused: No    Physically Abused: No    Sexually Abused: No   Family History  Problem Relation Age of Onset   Cancer Sister 46       stomach   Hypertension Mother    Hypertension Father    Breast cancer Cousin    Hypertension Maternal Grandmother    Hypertension Maternal Grandfather  Breast cancer Paternal Aunt    Allergies  Allergen Reactions   Ampicillin Hives   Bactrim [Sulfamethoxazole-Trimethoprim] Swelling   Jardiance [Empagliflozin]     Recurrent vaginal itching-yeast.   Latex Rash    Review of Systems  Constitutional:  Negative for chills and fever.  HENT: Negative.    Eyes: Negative.   Respiratory:  Negative for shortness of breath.   Cardiovascular:  Negative for chest pain.  Gastrointestinal: Negative.   Genitourinary: Negative.   Musculoskeletal: Negative.   Skin: Negative.   Neurological: Negative.   Endo/Heme/Allergies: Negative.   Psychiatric/Behavioral: Negative.        Objective:     BP 114/82   Pulse 96   Wt 215 lb (97.5 kg)   LMP 02/12/2018  (Approximate)   SpO2 97%   BMI 35.78 kg/m      Assessment & Plan:   Problem List Items Addressed This Visit   None Visit Diagnoses     Boil, back    -  Primary   Relevant Medications   doxycycline (VIBRAMYCIN) 100 MG capsule      Assessment and Plan: 1. Boil, back Trial doxycycline.  Patient education given on supportive care, red flags given for prompt reevaluation.  Patient encouraged to follow-up with mobile unit in 2 weeks for diabetes management.  Patient understands and agrees. - doxycycline (VIBRAMYCIN) 100 MG capsule; Take 1 capsule (100 mg total) by mouth 2 (two) times daily.  Dispense: 20 capsule; Refill: 0   Follow Up Instructions:    I discussed the assessment and treatment plan with the patient. The patient was provided an opportunity to ask questions and all were answered. The patient agreed with the plan and demonstrated an understanding of the instructions.   The patient was advised to call back or seek an in-person evaluation if the symptoms worsen or if the condition fails to improve as anticipated.  I provided 20 minutes of non-face-to-face time during this encounter.     Return in about 2 weeks (around 10/08/2022).    Kasandra Knudsen Mayers, PA-C

## 2022-10-05 ENCOUNTER — Ambulatory Visit
Admission: EM | Admit: 2022-10-05 | Discharge: 2022-10-05 | Disposition: A | Discharge status: Home / Self Care | Payer: No Typology Code available for payment source | Attending: Nurse Practitioner | Admitting: Nurse Practitioner

## 2022-10-05 DIAGNOSIS — F419 Anxiety disorder, unspecified: Secondary | ICD-10-CM

## 2022-10-05 DIAGNOSIS — L089 Local infection of the skin and subcutaneous tissue, unspecified: Secondary | ICD-10-CM

## 2022-10-05 DIAGNOSIS — L720 Epidermal cyst: Secondary | ICD-10-CM

## 2022-10-05 MED ORDER — HYDROXYZINE HCL 25 MG PO TABS: 25.0000 mg | ORAL_TABLET | Freq: Three times a day (TID) | ORAL | 0 refills | Status: DC | PRN

## 2022-10-05 MED ORDER — CEPHALEXIN 500 MG PO CAPS: 500.0000 mg | ORAL_CAPSULE | Freq: Four times a day (QID) | ORAL | 0 refills | Status: AC
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
## 2022-10-05 NOTE — Discharge Instructions (Signed)
Stop the doxycycline and start Keflex 4 times a day for 10 days Warm compresses to the area as needed You may start hydroxyzine as needed for anxiety.  Please note this medication can make you drowsy.  Do not drink alcohol or drive while on this medication Please follow-up with your PCP in 2 days for recheck Please go to the emergency room if you develop any worsening symptoms
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
## 2022-10-09 ENCOUNTER — Ambulatory Visit
Admission: EM | Admit: 2022-10-09 | Discharge: 2022-10-09 | Disposition: A | Discharge status: Home / Self Care | Payer: No Typology Code available for payment source | Attending: Emergency Medicine | Admitting: Emergency Medicine

## 2022-10-09 ENCOUNTER — Encounter: Payer: Self-pay | Admitting: *Deleted

## 2022-10-09 DIAGNOSIS — Z0289 Encounter for other administrative examinations: Secondary | ICD-10-CM

## 2022-10-09 DIAGNOSIS — F419 Anxiety disorder, unspecified: Secondary | ICD-10-CM

## 2022-10-09 MED ORDER — HYDROXYZINE HCL 10 MG PO TABS: 10.0000 mg | ORAL_TABLET | Freq: Three times a day (TID) | ORAL | 3 refills | Status: DC | PRN
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
## 2022-10-09 NOTE — Progress Notes (Signed)
Pt attended 09/24/22 screening event where blood sugar was 273 and A1C was 11.1. At event, pt confirmed her PCP was Dr. Jonah Blue at the [facility] and did not identify any SDOH insecurities. Pt had immediate video visit after the event with Cari Mayers, PA-C on the mobile unit for f/u of an infected cyst on her back for which she received antibiotics and was instructed to f/u if not improved with her PCP at [facility] or the mobile unit. Pt f/u at ED on 10/05/22 for unimpoved back cyst where she received and I and D and was given different antibiotics with instructions to f/u with her PCP. Chart review indicates pt has been getting refills on her diabetes and HTN meds No additional health equity team support indicated at this time.

## 2022-10-09 NOTE — Discharge Instructions (Addendum)
You can take the hydroxyzine up to three times daily as needed. You can take when you are feeling anxiety.  This medication does not have to be taken every day - you can use as needed!

## 2022-10-09 NOTE — ED Triage Notes (Signed)
Pt presents for work note stating she can return to work with no restrictions.

## 2022-10-22 ENCOUNTER — Other Ambulatory Visit: Payer: Self-pay

## 2022-10-22 ENCOUNTER — Other Ambulatory Visit: Payer: Self-pay | Admitting: Physician Assistant

## 2022-10-22 DIAGNOSIS — L02222 Furuncle of back [any part, except buttock]: Secondary | ICD-10-CM

## 2022-10-26 ENCOUNTER — Ambulatory Visit: Payer: Self-pay

## 2022-10-26 NOTE — Telephone Encounter (Signed)
  Chief Complaint: abscess Symptoms: abscess on middle of back, no drainage, or pain Frequency: ongoing since April  Pertinent Negatives: NA Disposition: [] ED /[] Urgent Care (no appt availability in office) / [] Appointment(In office/virtual)/ []  [place] [facility]/ [x] Home Care/ [] Refused Recommended Disposition /[] [place] [facility]/ []  Follow-up with PCP Additional Notes: pt calling to just see if she needs FU care. Pt states husband has been changing dressing and yesterday and slight pink drainage but today no drainage and no pain or redness. Pt has completed abx from UC. Offered FU OV for tomorrow but pt unable to come d/t work, scheduled for 11/11/22 at 1030 and added to wait list for cancellation. Advised pt to monitor sx and drainage or pain/redness gets worse to CB. Pt verbalized understanding.   Summary: abcess on back   Abscess located on back for a month, patient has already done two rounds of antibiotics, would like advice on what to do next        Reason for Disposition  [1] Taking antibiotic AND [2] wound infection symptoms are BETTER AND [3] no fever  Answer Assessment - Initial Assessment Questions 2. WOUND INFECTION LOCATION: "Where is the wound infection located?" (e.g., arm, face, foot, knee, leg)     Middle of back  3. WOUND INFECTION SIZE: "What is the size of the red area?" (e.g., inches, centimeters; compare to size of a coin)       4. BETTER-SAME-WORSE: "Are you getting better, staying the same, or getting worse compared to the day you started the antibiotics?"      Better  5. PAIN: "Do you have any pain?"  If Yes, ask: "How bad is the pain?"  (e.g., Scale 1-10; mild, moderate, or severe)    - MILD (1-3): Doesn't interfere with normal activities.     - MODERATE (4-7): Interferes with normal activities or awakens from sleep.    - SEVERE (8-10): Excruciating pain, unable to do any normal activities.       0 7. OTHER SYMPTOMS: "Do you have any other symptoms?"  (e.g., pus coming from a wound, red streaks, weakness)     No  8. DIAGNOSIS DATE: "When was the wound infection diagnosed?" "By whom?"      09/24/22 Cari, PA  9. ANTIBIOTIC NAME: "What antibiotic(s) are you taking?"  "How many times a day?" Note: Be sure the patient is taking the antibiotic as directed.      Has been on Doxycycline and Keflex  Protocols used: Wound Infection on Antibiotic Follow-up Call-A-AH

## 2022-11-11 ENCOUNTER — Ambulatory Visit: Payer: Self-pay | Attending: Physician Assistant | Admitting: Physician Assistant

## 2022-11-11 ENCOUNTER — Other Ambulatory Visit: Payer: Self-pay

## 2022-11-11 ENCOUNTER — Encounter: Payer: Self-pay | Admitting: Physician Assistant

## 2022-11-11 VITALS — BP 113/73 | HR 82 | Ht 65.0 in | Wt 220.4 lb

## 2022-11-11 DIAGNOSIS — E669 Obesity, unspecified: Secondary | ICD-10-CM

## 2022-11-11 DIAGNOSIS — I10 Essential (primary) hypertension: Secondary | ICD-10-CM

## 2022-11-11 DIAGNOSIS — L723 Sebaceous cyst: Secondary | ICD-10-CM

## 2022-11-11 DIAGNOSIS — Z7984 Long term (current) use of oral hypoglycemic drugs: Secondary | ICD-10-CM

## 2022-11-11 DIAGNOSIS — Z09 Encounter for follow-up examination after completed treatment for conditions other than malignant neoplasm: Secondary | ICD-10-CM

## 2022-11-11 DIAGNOSIS — E1169 Type 2 diabetes mellitus with other specified complication: Secondary | ICD-10-CM

## 2022-11-11 DIAGNOSIS — E785 Hyperlipidemia, unspecified: Secondary | ICD-10-CM

## 2022-11-11 LAB — GLUCOSE, POCT (MANUAL RESULT ENTRY): POC Glucose: 201 mg/dl — AB (ref 70–99)

## 2022-11-11 MED ORDER — GLIPIZIDE 5 MG PO TABS
5.0000 mg | ORAL_TABLET | Freq: Two times a day (BID) | ORAL | 3 refills | Status: DC
Start: 2022-11-11 — End: 2023-03-12
  Filled 2022-11-11 – 2022-11-16 (×2): qty 60, 30d supply, fill #0
  Filled 2023-01-08: qty 60, 30d supply, fill #1
  Filled 2023-02-09: qty 60, 30d supply, fill #2

## 2022-11-11 MED ORDER — MUPIROCIN 2 % EX OINT
1.0000 | TOPICAL_OINTMENT | Freq: Two times a day (BID) | CUTANEOUS | 0 refills | Status: DC
Start: 2022-11-11 — End: 2023-03-12
  Filled 2022-11-11: qty 22, 11d supply, fill #0

## 2022-11-11 MED ORDER — METFORMIN HCL 1000 MG PO TABS
1000.0000 mg | ORAL_TABLET | Freq: Two times a day (BID) | ORAL | 1 refills | Status: DC
Start: 2022-11-11 — End: 2023-07-28
  Filled 2022-11-11 – 2022-11-16 (×2): qty 180, 90d supply, fill #0
  Filled 2023-04-02: qty 60, 30d supply, fill #1
  Filled 2023-05-24: qty 60, 30d supply, fill #2
  Filled 2023-06-29 (×2): qty 60, 30d supply, fill #3

## 2022-11-11 MED ORDER — ATORVASTATIN CALCIUM 40 MG PO TABS
40.0000 mg | ORAL_TABLET | Freq: Every day | ORAL | 6 refills | Status: DC
Start: 2022-11-11 — End: 2023-07-28
  Filled 2022-11-11 – 2022-11-16 (×2): qty 30, 30d supply, fill #0
  Filled 2023-01-08: qty 30, 30d supply, fill #1
  Filled 2023-02-09: qty 30, 30d supply, fill #2
  Filled 2023-04-02: qty 30, 30d supply, fill #3
  Filled 2023-04-27: qty 30, 30d supply, fill #4
  Filled 2023-05-24: qty 30, 30d supply, fill #5
  Filled 2023-06-29 (×2): qty 30, 30d supply, fill #6

## 2022-11-11 MED ORDER — AMLODIPINE BESYLATE 5 MG PO TABS
5.0000 mg | ORAL_TABLET | Freq: Every day | ORAL | 2 refills | Status: AC
Start: 2022-11-11 — End: ?
  Filled 2022-11-11 – 2022-11-16 (×2): qty 30, 30d supply, fill #0
  Filled 2023-01-08: qty 30, 30d supply, fill #1
  Filled 2023-02-09: qty 30, 30d supply, fill #2

## 2022-11-11 NOTE — Patient Instructions (Signed)
Quiste epidermoide Epidermoid Cyst  Un quiste epidermoide, tambin llamado quiste sebceo, es un pequeo bulto debajo de la piel. Este quiste contiene una sustancia denominada queratina. No trate de reventar o abrir el quiste usted mismo. Cules son las causas? Un folculo piloso obstruido. Un pelo que se riza y vuelve a entrar en la piel en vez de crecer hacia fuera de la piel. Un poro obstruido. Irritacin de la piel. Una lesin en la piel. Determinadas afecciones que se transmiten de padres a hijos. Virus del papiloma humano (VPH). Esto ocurre rara vez cuando se producen quistes en la parte inferior de los pies. Dao a largo plazo en la piel causado por el sol. Qu incrementa el riesgo? Acn. Ser hombre. Sufrir una [employer]. Haber pasado la pubertad. Tener ciertas afecciones causadas por genes (trastorno gentico). Cules son los signos o sntomas? En general, estos quistes no causan daos, pero pueden infectarse. Los sntomas de infeccin pueden incluir los siguientes: Enrojecimiento. Inflamacin. Dolor a Insurance claims handler. Calor. Grant Ruts. Del quiste sale una sustancia con mal olor. Del [employer] pus. Cmo se trata? En muchos casos, los quistes epidermoides desaparecen por s solos, sin tratamiento. Si el quiste se infecta, el tratamiento puede incluir: Librarian, academic y Electronics engineer. Este procedimiento lo realiza un mdico. Despus del drenaje, es posible que se le deba hacer una ciruga menor para retirar el resto del [place]. Antibiticos. Inyecciones de medicamentos (corticoesteroides) que ayudan a Social research officer, government. Ciruga para extirpar el quiste. Se puede realizar una ciruga si el quiste: Se vuelve grande. Rudean Curt. Tiene probabilidades de transformarse en cncer. No intente abrir el quiste usted mismo. Siga estas instrucciones en su casa: Medicamentos Use los medicamentos de venta libre y los recetados como se lo haya indicado el mdico. Si le recetaron  un antibitico, tmelo como se lo haya indicado el mdico. No deje de tomarlo aunque comience a sentirse mejor. Indicaciones generales Mantenga limpia y seca el rea que rodea al [place]. Use ropa holgada y [place]. Evite tocarse el quiste. Revsese el [employer] para detectar signos de infeccin. Est atento a los siguientes signos: Enrojecimiento, Optician, dispensing. Lquido o sangre. Calor. Pus o mal olor. Cumpla con todas las visitas de seguimiento. Cmo se previene? Use ropa limpia y [place]. Evite usar ropa [place]. Mantenga la piel limpia y seca. Tome duchas o baos todos [place]. Comunquese con un mdico si: El quiste tiene sntomas de infeccin. El problema empeora o no mejora. Tiene un quiste que se ve diferente a otros quistes que haya tenido. Tiene fiebre. Solicite ayuda de inmediato si: Tiene un enrojecimiento que se extiende desde el quiste hacia el rea cercana. Resumen Un quiste epidermoide es un bulto pequeo que se encuentra debajo de la piel. Si un quiste se infecta, el tratamiento puede incluir ciruga para abrirlo y Education officer, community o para Customer service manager. Use los medicamentos de venta libre y los recetados solamente como se lo haya indicado el mdico. Comunquese con un mdico si el problema empeora o no mejora. Cumpla con todas las visitas de seguimiento. Esta informacin no tiene Theme park manager el consejo del mdico. Asegrese de hacerle al mdico cualquier pregunta que tenga. Document Revised: 11/21/2019 Document Reviewed: 11/21/2019 Elsevier Patient Education  2024 [employer].

## 2022-11-11 NOTE — Progress Notes (Signed)
Patient ID: Alexis Mooney, female   DOB: Jan 12, 1966, 57 y.o.   MRN: [id number]   Alexis Mooney, is a 57 y.o. female  NWG:[id number]  [id number]  DOB - 1966-03-30  Chief Complaint  Patient presents with   Diabetes   Hypertension       Subjective:   Alexis Mooney is a 57 y.o. female here today for a follow up visit after being seen at [facility] then ED for infected sebaceous cyst.  She had I&D and was placed on a course of doxy and keflex respectively.  The cyst has improved but continues to drain at times.  She wants to have it removed but I told her we do not do that here.  No fever.  Still tender.  Located mid back where bra strap falls  Not checking blood sugars but she did recently resume metformin and glipizide.    No problems updated.  ALLERGIES: Allergies  Allergen Reactions   Ampicillin Hives   Bactrim [Sulfamethoxazole-Trimethoprim] Swelling   Jardiance [Empagliflozin]     Recurrent vaginal itching-yeast.   Latex Rash    PAST MEDICAL HISTORY: Past Medical History:  Diagnosis Date   Abscess    Diabetes (HCC)    High cholesterol    Hyperlipidemia associated with type 2 diabetes mellitus (HCC) 07/11/2019   Hypertension    Miscarriage    Pollen allergies     MEDICATIONS AT HOME: Prior to Admission medications   Medication Sig Start Date End Date Taking? Authorizing Provider  Blood Glucose Monitoring Suppl (TRUE METRIX METER) w/Device KIT Use as instructed. Check blood glucose level by fingerstick twice per day. 12/08/18  Yes Claiborne Rigg, NP  glucose blood (TRUE METRIX BLOOD GLUCOSE TEST) test strip Use as instructed. Check blood glucose level by fingerstick twice per day. 01/31/19  Yes Storm Frisk, MD  hydrOXYzine (ATARAX) 10 MG tablet Take 1 tablet (10 mg total) by mouth 3 (three) times daily as needed. 10/09/22  Yes Rising, Lurena Joiner, PA-C  meloxicam (MOBIC) 15 MG tablet Take 1 tablet (15 mg total) by mouth daily. 03/11/22  Yes Anders Simmonds, PA-C  mupirocin ointment (BACTROBAN) 2 % Apply 1 Application topically 2 (two) times daily. 11/11/22  Yes Georgian Co M, PA-C  omeprazole (PRILOSEC) 20 MG capsule Take 1 capsule (20 mg total) by mouth daily. 07/11/19  Yes Marcine Matar, MD  traMADol (ULTRAM) 50 MG tablet Take 1 tablet (50 mg total) by mouth every 12 (twelve) hours as needed. Use sparingly 03/11/22  Yes Joncarlos Atkison M, PA-C  TRUEplus Lancets 28G MISC Use as instructed. Check blood glucose level by fingerstick twice per day. 01/31/19  Yes Storm Frisk, MD  amLODipine (NORVASC) 5 MG tablet Take 1 tablet (5 mg total) by mouth daily (Patient needs to keep upcoming appointment for further refills.) 11/11/22   Anders Simmonds, PA-C  atorvastatin (LIPITOR) 40 MG tablet Take 1 tablet (40 mg total) by mouth daily. 11/11/22   Anders Simmonds, PA-C  glipiZIDE (GLUCOTROL) 5 MG tablet Take 1 tablet (5 mg total) by mouth 2 (two) times daily before a meal. 11/11/22   Tammela Bales, Marzella Schlein, PA-C  loratadine (CLARITIN) 10 MG tablet Take 1 tablet (10 mg total) by mouth daily. Patient not taking: Reported on 11/11/2022 01/04/15   Ambrose Finland, NP  metFORMIN (GLUCOPHAGE) 1000 MG tablet Take 1 tablet (1,000 mg total) by mouth 2 (two) times daily with a meal. 11/11/22   Anders Simmonds, PA-C    ROS:  Neg HEENT Neg resp Neg cardiac Neg GI Neg GU Neg MS Neg psych Neg neuro  Objective:   Vitals:   11/11/22 1044  BP: 113/73  Pulse: 82  SpO2: 97%  Weight: 220 lb 6.4 oz (100 kg)  Height: 5\' 5"  (1.651 m)   Exam General appearance : Awake, alert, not in any distress. Speech Clear. Not toxic looking HEENT: Atraumatic and Normocephalic Neck: Supple, no JVD. No cervical lymphadenopathy.  Chest: Good air entry bilaterally, CTAB.  No rales/rhonchi/wheezing CVS: S1 S2 regular, no murmurs.  Mid back there is a 3cm inclusion cyst with visible puncta that is healing.  Not draining.  Mild erythema but appears as though healing from  I&D Extremities: B/L Lower Ext shows no edema, both legs are warm to touch Neurology: Awake alert, and oriented X 3, CN II-XII intact, Non focal Skin: No Rash  Data Review Lab Results  Component Value Date   HGBA1C 11.1 09/22/2022   HGBA1C 9.9 (A) 03/11/2022   HGBA1C 10.9 (A) 06/17/2021    Assessment & Plan   1. Type 2 diabetes mellitus with obesity (HCC) Only back on meds for a few weeks.  uncontrolled - Glucose (CBG) - Microalbumin / creatinine urine ratio - glipiZIDE (GLUCOTROL) 5 MG tablet; Take 1 tablet (5 mg total) by mouth 2 (two) times daily before a meal.  Dispense: 60 tablet; Refill: 3 - metFORMIN (GLUCOPHAGE) 1000 MG tablet; Take 1 tablet (1,000 mg total) by mouth 2 (two) times daily with a meal.  Dispense: 180 tablet; Refill: 1  2. Essential hypertension controlled - amLODipine (NORVASC) 5 MG tablet; Take 1 tablet (5 mg total) by mouth daily (Patient needs to keep upcoming appointment for further refills.)  Dispense: 30 tablet; Refill: 2  3. Sebaceous cyst Needs to have the cyst removed to prevent recurrence - mupirocin ointment (BACTROBAN) 2 %; Apply 1 Application topically 2 (two) times daily.  Dispense: 22 g; Refill: 0 - Ambulatory referral to Dermatology  4. Hyperlipidemia associated with type 2 diabetes mellitus (HCC) - atorvastatin (LIPITOR) 40 MG tablet; Take 1 tablet (40 mg total) by mouth daily.  Dispense: 30 tablet; Refill: 6  5. Encounter for examination following treatment at hospital    Return in about 3 months (around 02/11/2023) for PCP for chronic conditions.  The patient was given clear instructions to go to ER or return to medical center if symptoms don't improve, worsen or new problems develop. The patient verbalized understanding. The patient was told to call to get lab results if they haven't heard anything in the next week.      Georgian Co, PA-C [facility] [place], [place] [phone]   11/11/2022,  11:17 AM

## 2022-11-12 LAB — MICROALBUMIN / CREATININE URINE RATIO
Creatinine, Urine: 41.9 mg/dL
Microalb/Creat Ratio: 36 mg/g creat — ABNORMAL HIGH (ref 0–29)
Microalbumin, Urine: 15 ug/mL

## 2022-11-16 ENCOUNTER — Other Ambulatory Visit: Payer: Self-pay

## 2023-01-08 ENCOUNTER — Other Ambulatory Visit: Payer: Self-pay

## 2023-02-09 ENCOUNTER — Other Ambulatory Visit: Payer: Self-pay | Admitting: Physician Assistant

## 2023-02-09 ENCOUNTER — Other Ambulatory Visit: Payer: Self-pay

## 2023-02-09 DIAGNOSIS — M170 Bilateral primary osteoarthritis of knee: Secondary | ICD-10-CM

## 2023-02-09 MED ORDER — MELOXICAM 15 MG PO TABS
15.0000 mg | ORAL_TABLET | Freq: Every day | ORAL | 0 refills | Status: DC
Start: 2023-02-09 — End: 2023-03-12
  Filled 2023-02-09: qty 30, 30d supply, fill #0

## 2023-02-11 ENCOUNTER — Ambulatory Visit: Payer: Self-pay | Admitting: Internal Medicine

## 2023-03-08 ENCOUNTER — Telehealth: Payer: Self-pay

## 2023-03-08 ENCOUNTER — Other Ambulatory Visit: Payer: Self-pay | Admitting: Internal Medicine

## 2023-03-08 DIAGNOSIS — I10 Essential (primary) hypertension: Secondary | ICD-10-CM

## 2023-03-08 NOTE — Telephone Encounter (Signed)
Patient will need an office visit.  Last A1c was done in April.

## 2023-03-08 NOTE — Telephone Encounter (Unsigned)
Copied from CRM [id number]. Topic: General - Other >> Mar 08, 2023 11:37 AM Everette C wrote: Reason for CRM: Medication Refill - Medication: Rx #: [id number]  amLODipine (NORVASC) 5 MG tablet [[id number]]   Has the patient contacted their pharmacy? Yes.   (Agent: If no, request that the patient contact the pharmacy for the refill. If patient does not wish to contact the pharmacy document the reason why and proceed with request.) (Agent: If yes, when and what did the pharmacy advise?)  Preferred Pharmacy (with phone number or street name): [facility] [address] [place] [address] Phone: [phone] Fax: [phone] Hours: M-F 7:30a-6:00p  Has the patient been seen for an appointment in the last year OR does the patient have an upcoming appointment? Yes.    Agent: Please be advised that RX refills may take up to 3 business days. We ask that you follow-up with your pharmacy.

## 2023-03-08 NOTE — Telephone Encounter (Signed)
Copied from CRM [id number]. Topic: General - Other >> Mar 08, 2023 11:36 AM Alexis Mooney wrote: Reason for CRM: The patient has called to request lab order to check their A1C  The patient has been told that they will need their A1C checked prior to being seen by a dentist   Please contact further when possible

## 2023-03-09 ENCOUNTER — Other Ambulatory Visit (HOSPITAL_COMMUNITY): Payer: Self-pay

## 2023-03-09 ENCOUNTER — Other Ambulatory Visit: Payer: Self-pay

## 2023-03-09 ENCOUNTER — Encounter (HOSPITAL_COMMUNITY): Payer: Self-pay

## 2023-03-09 MED ORDER — AMLODIPINE BESYLATE 5 MG PO TABS
5.0000 mg | ORAL_TABLET | Freq: Every day | ORAL | 0 refills | Status: DC
Start: 2023-03-09 — End: 2023-03-12
  Filled 2023-03-09: qty 30, 30d supply, fill #0

## 2023-03-09 NOTE — Telephone Encounter (Signed)
Called & spoke to the patient. Verified name & DOB. Scheduled follow-up appointment with Dr.Johnson for 03/12/2023. Patient confirmed appointment.

## 2023-03-09 NOTE — Telephone Encounter (Signed)
Requested Prescriptions  Pending Prescriptions Disp Refills   amLODipine (NORVASC) 5 MG tablet 30 tablet 0    Sig: Take 1 tablet (5 mg total) by mouth daily (Patient needs to keep upcoming appointment for further refills.)     Cardiovascular: Calcium Channel Blockers 2 Passed - 03/08/2023  1:12 PM      Passed - Last BP in normal range    BP Readings from Last 1 Encounters:  11/11/22 113/73         Passed - Last Heart Rate in normal range    Pulse Readings from Last 1 Encounters:  11/11/22 82         Passed - Valid encounter within last 6 months    Recent Outpatient Visits           3 months ago Type 2 diabetes mellitus with obesity [facility])   [facility] [place], [place], [place]   12 months ago Type 2 diabetes mellitus with obesity [facility])   [facility] [place], [place], [place]   1 year ago Type 2 diabetes mellitus with obesity [facility])   [facility] Jonah Blue B, MD   2 years ago Type 2 diabetes mellitus without complication, without long-term current use of insulin [facility])   [facility] Marcine Matar, MD   3 years ago Primary osteoarthritis of both knees   [facility] Marcine Matar, MD       Future Appointments             In 3 days Marcine Matar, MD [facility]

## 2023-03-12 ENCOUNTER — Other Ambulatory Visit: Payer: Self-pay

## 2023-03-12 ENCOUNTER — Other Ambulatory Visit: Payer: Self-pay | Admitting: Pharmacist

## 2023-03-12 ENCOUNTER — Ambulatory Visit: Payer: Self-pay | Attending: Internal Medicine | Admitting: Internal Medicine

## 2023-03-12 VITALS — BP 113/75 | HR 85 | Ht 65.0 in | Wt 214.0 lb

## 2023-03-12 DIAGNOSIS — Z6825 Body mass index (BMI) 25.0-25.9, adult: Secondary | ICD-10-CM

## 2023-03-12 DIAGNOSIS — E785 Hyperlipidemia, unspecified: Secondary | ICD-10-CM

## 2023-03-12 DIAGNOSIS — E1159 Type 2 diabetes mellitus with other circulatory complications: Secondary | ICD-10-CM

## 2023-03-12 DIAGNOSIS — M21962 Unspecified acquired deformity of left lower leg: Secondary | ICD-10-CM

## 2023-03-12 DIAGNOSIS — N760 Acute vaginitis: Secondary | ICD-10-CM

## 2023-03-12 DIAGNOSIS — Z1211 Encounter for screening for malignant neoplasm of colon: Secondary | ICD-10-CM

## 2023-03-12 DIAGNOSIS — M170 Bilateral primary osteoarthritis of knee: Secondary | ICD-10-CM

## 2023-03-12 DIAGNOSIS — E1169 Type 2 diabetes mellitus with other specified complication: Secondary | ICD-10-CM

## 2023-03-12 DIAGNOSIS — Z2821 Immunization not carried out because of patient refusal: Secondary | ICD-10-CM

## 2023-03-12 DIAGNOSIS — I152 Hypertension secondary to endocrine disorders: Secondary | ICD-10-CM

## 2023-03-12 DIAGNOSIS — E669 Obesity, unspecified: Secondary | ICD-10-CM

## 2023-03-12 DIAGNOSIS — E119 Type 2 diabetes mellitus without complications: Secondary | ICD-10-CM

## 2023-03-12 DIAGNOSIS — Z23 Encounter for immunization: Secondary | ICD-10-CM

## 2023-03-12 DIAGNOSIS — M21961 Unspecified acquired deformity of right lower leg: Secondary | ICD-10-CM

## 2023-03-12 DIAGNOSIS — Z7984 Long term (current) use of oral hypoglycemic drugs: Secondary | ICD-10-CM

## 2023-03-12 DIAGNOSIS — R300 Dysuria: Secondary | ICD-10-CM

## 2023-03-12 LAB — POCT URINALYSIS DIP (CLINITEK)
Bilirubin, UA: NEGATIVE
Glucose, UA: NEGATIVE mg/dL
Ketones, POC UA: NEGATIVE mg/dL
Nitrite, UA: NEGATIVE
POC PROTEIN,UA: NEGATIVE
Spec Grav, UA: 1.025 (ref 1.010–1.025)
Urobilinogen, UA: 0.2 U/dL
pH, UA: 5.5 (ref 5.0–8.0)

## 2023-03-12 LAB — POCT GLYCOSYLATED HEMOGLOBIN (HGB A1C): HbA1c, POC (controlled diabetic range): 9.6 % — AB (ref 0.0–7.0)

## 2023-03-12 LAB — GLUCOSE, POCT (MANUAL RESULT ENTRY): POC Glucose: 182 mg/dL — AB (ref 70–99)

## 2023-03-12 MED ORDER — AMLODIPINE BESYLATE 5 MG PO TABS
5.0000 mg | ORAL_TABLET | Freq: Every day | ORAL | 1 refills | Status: DC
  Filled 2023-03-12: qty 30, 30d supply, fill #0
  Filled 2023-04-02: qty 30, 30d supply, fill #1
  Filled 2023-04-27: qty 30, 30d supply, fill #2
  Filled 2023-05-24: qty 30, 30d supply, fill #3
  Filled 2023-06-29 (×2): qty 30, 30d supply, fill #4
  Filled 2023-07-28: qty 30, 30d supply, fill #5

## 2023-03-12 MED ORDER — FLUCONAZOLE 150 MG PO TABS
150.0000 mg | ORAL_TABLET | Freq: Every day | ORAL | 2 refills | Status: DC
Start: 2023-03-12 — End: 2023-03-23
  Filled 2023-03-12 (×2): qty 1, 1d supply, fill #0

## 2023-03-12 MED ORDER — TRUE METRIX METER W/DEVICE KIT
PACK | 0 refills | Status: DC
  Filled 2023-03-12: qty 1, 30d supply, fill #0

## 2023-03-12 MED ORDER — GLIPIZIDE 5 MG PO TABS
5.0000 mg | ORAL_TABLET | Freq: Two times a day (BID) | ORAL | 1 refills | Status: DC
  Filled 2023-03-12 (×2): qty 60, 30d supply, fill #0
  Filled 2023-04-02: qty 60, 30d supply, fill #1
  Filled 2023-04-27: qty 60, 30d supply, fill #2
  Filled 2023-05-24: qty 60, 30d supply, fill #3
  Filled 2023-06-29 (×2): qty 60, 30d supply, fill #4
  Filled 2023-07-28: qty 60, 30d supply, fill #5

## 2023-03-12 MED ORDER — TRUEPLUS LANCETS 28G MISC
6 refills | Status: DC
  Filled 2023-03-12: qty 100, 33d supply, fill #0
  Filled 2023-09-21 (×2): qty 100, 33d supply, fill #1
  Filled 2023-11-30: qty 100, 33d supply, fill #2

## 2023-03-12 MED ORDER — TRUE METRIX BLOOD GLUCOSE TEST VI STRP
ORAL_STRIP | 12 refills | Status: DC
  Filled 2023-03-12: qty 100, 30d supply, fill #0
  Filled 2023-03-12: qty 100, 25d supply, fill #0
  Filled 2023-09-21 (×2): qty 100, 25d supply, fill #1
  Filled 2023-11-30: qty 100, 25d supply, fill #2

## 2023-03-12 MED ORDER — MELOXICAM 15 MG PO TABS
15.0000 mg | ORAL_TABLET | Freq: Every day | ORAL | 1 refills | Status: DC
  Filled 2023-03-12 (×2): qty 30, 30d supply, fill #0
  Filled 2023-04-02: qty 30, 30d supply, fill #1
  Filled 2023-05-24: qty 30, 30d supply, fill #2
  Filled 2023-06-29 (×2): qty 30, 30d supply, fill #3
  Filled 2023-07-28: qty 30, 30d supply, fill #4
  Filled 2023-09-21 (×2): qty 30, 30d supply, fill #5

## 2023-03-12 MED ORDER — TRAMADOL HCL 50 MG PO TABS
50.0000 mg | ORAL_TABLET | Freq: Two times a day (BID) | ORAL | 0 refills | Status: DC | PRN
  Filled 2023-03-12 (×2): qty 30, 15d supply, fill #0

## 2023-03-12 MED ORDER — ZOSTER VAC RECOMB ADJUVANTED 50 MCG/0.5ML IM SUSR
0.5000 mL | Freq: Once | INTRAMUSCULAR | 0 refills | Status: AC
Start: 2023-03-12 — End: 2023-03-12

## 2023-03-12 NOTE — Progress Notes (Signed)
Patient ID: Alexis Mooney, female    DOB: 20-Feb-1966  MRN: [id number]  CC: Diabetes (DM f/u. Med refill. Charlton Haws itching X2 weeks/No to flu vax. Yes to shingles rx)   Subjective: Alexis Mooney is a 57 y.o. female who presents for chronic ds management. Her concerns today include:  History of DM type 2, HTN, HL, morbid obesity, osteoarthritis of the knees,    Discussed the use of AI scribe software for clinical note transcription with the patient, who gave verbal consent to proceed.  History of Present Illness   Pt c/o vaginal itching without dischg for the past two weeks. She also reports occasional burning at the end of urination, which has occurred three times prior to today's visit.  DM: Results for orders placed or performed in visit on 03/12/23  POCT glycosylated hemoglobin (Hb A1C)  Result Value Ref Range   Hemoglobin A1C     HbA1c POC (<> result, manual entry)     HbA1c, POC (prediabetic range)     HbA1c, POC (controlled diabetic range) 9.6 (A) 0.0 - 7.0 %  POCT glucose (manual entry)  Result Value Ref Range   POC Glucose 182 (A) 70 - 99 mg/dl  POCT URINALYSIS DIP (CLINITEK)  Result Value Ref Range   Color, UA yellow yellow   Clarity, UA clear clear   Glucose, UA negative negative mg/dL   Bilirubin, UA negative negative   Ketones, POC UA negative negative mg/dL   Spec Grav, UA 4.098 1.191 - 1.025   Blood, UA trace-lysed (A) negative   pH, UA 5.5 5.0 - 8.0   POC PROTEIN,UA negative negative, trace   Urobilinogen, UA 0.2 0.2 or 1.0 E.U./dL   Nitrite, UA Negative Negative   Leukocytes, UA Small (1+) (A) Negative   The patient's diabetes is not well controlled, with an A1c of 9.6, although this is an improvement from 11 in April.  She should be on glipizide 5 mg twice a day and metformin 1 g twice a day.  She admits to forgetting to take her evening doses of metformin and glipizide several times a wk. has medication box which she sets up every week.   She plans to get a second medication box for evening doses of her medicines.  Doing better with her eating habits.  Less fried foods and drinking more water. -Due for eye exam.  She is uninsured.  HTN: Reports compliance with amlodipine 5 mg daily and low-salt diet.  No chest pains or shortness of breath.  In regards to her cholesterol, she is taking and tolerating atorvastatin 40 mg daily.  OA knees:  arthritis has been causing increased pain in her knees and legs over the past three weeks. She has been taking meloxicam daily, and sometimes twice daily, for the pain.  Request a refill on tramadol to use on days when pain is severe.  She has not been requiring this on a regular basis  The patient also reports a hammer toe on her right foot and bunions on both feet, which cause discomfort when wearing certain shoes.     HM: She declines flu shot.  She would like to get the shingles vaccine.  Turn did not fit test earlier this year but the specimen was no good.  She is agreeable to repeating it. Patient Active Problem List   Diagnosis Date Noted   Influenza vaccine refused 05/23/2020   Bunion of great toe 05/23/2020   23-polyvalent pneumococcal polysaccharide vaccine declined 05/23/2020  Hyperlipidemia associated with type 2 diabetes mellitus (HCC) 07/11/2019   Type 2 diabetes mellitus without complication, without long-term current use of insulin (HCC) 07/11/2019   Screening breast examination 04/25/2019   Axillary lump, left 04/25/2019   Frequency of urination 01/31/2019   Dysuria 01/31/2019   Epigastric pain 01/31/2019   Primary osteoarthritis of both knees 07/06/2018   Memory disorder 02/04/2018   Anxiety and depression 03/19/2017   Hair loss 03/19/2017   High triglycerides 12/29/2016   Perimenopause 01/22/2014   Essential hypertension 01/22/2014     Current Outpatient Medications on File Prior to Visit  Medication Sig Dispense Refill   atorvastatin (LIPITOR) 40 MG tablet Take 1  tablet (40 mg total) by mouth daily. 30 tablet 6   metFORMIN (GLUCOPHAGE) 1000 MG tablet Take 1 tablet (1,000 mg total) by mouth 2 (two) times daily with a meal. 180 tablet 1   hydrOXYzine (ATARAX) 10 MG tablet Take 1 tablet (10 mg total) by mouth 3 (three) times daily as needed. (Patient not taking: Reported on 03/12/2023) 30 tablet 3   loratadine (CLARITIN) 10 MG tablet Take 1 tablet (10 mg total) by mouth daily. (Patient not taking: Reported on 11/11/2022) 30 tablet 11   No current facility-administered medications on file prior to visit.    Allergies  Allergen Reactions   Ampicillin Hives   Bactrim [Sulfamethoxazole-Trimethoprim] Swelling   Jardiance [Empagliflozin]     Recurrent vaginal itching-yeast.   Latex Rash    Social History   Socioeconomic History   Marital status: Single    Spouse name: Not on file   Number of children: 0   Years of education: Not on file   Highest education level: Bachelor's degree (e.g., BA, AB, BS)  Occupational History   Not on file  Tobacco Use   Smoking status: Former    Current packs/day: 0.00    Types: Cigarettes    Quit date: 09/24/1994    Years since quitting: 28.4   Smokeless tobacco: Never  Vaping Use   Vaping status: Never Used  Substance and Sexual Activity   Alcohol use: Yes    Comment: rarely   Drug use: No   Sexual activity: Yes    Birth control/protection: None, Post-menopausal  Other Topics Concern   Not on file  Social History Narrative   Not on file   Social Determinants of Health   Financial Resource Strain: Medium Risk (11/07/2022)   Overall Financial Resource Strain (CARDIA)    Difficulty of Paying Living Expenses: Somewhat hard  Food Insecurity: Food Insecurity Present (11/07/2022)   Hunger Vital Sign    Worried About Running Out of Food in the Last Year: Sometimes true    Ran Out of Food in the Last Year: Never true  Transportation Needs: No Transportation Needs (11/07/2022)   PRAPARE - Scientist, research (physical sciences) (Medical): No    Lack of Transportation (Non-Medical): No  Physical Activity: Unknown (11/07/2022)   Exercise Vital Sign    Days of Exercise per Week: 0 days    Minutes of Exercise per Session: Not on file  Stress: Stress Concern Present (11/07/2022)   [employer] of Occupational Health - Occupational Stress Questionnaire    Feeling of Stress : To some extent  Social Connections: Moderately Isolated (11/07/2022)   Social Connection and Isolation Panel [NHANES]    Frequency of Communication with Friends and Family: Twice a week    Frequency of Social Gatherings with Friends and Family: Twice a week  Attends Religious Services: Never    Active Member of Clubs or Organizations: No    Attends Banker Meetings: Not on file    Marital Status: Married  Intimate Partner Violence: Not At Risk (09/24/2022)   Humiliation, Afraid, Rape, and Kick questionnaire    Fear of Current or Ex-Partner: No    Emotionally Abused: No    Physically Abused: No    Sexually Abused: No    Family History  Problem Relation Age of Onset   Cancer Sister 66       stomach   Hypertension Mother    Hypertension Father    Breast cancer Cousin    Hypertension Maternal Grandmother    Hypertension Maternal Grandfather    Breast cancer Paternal Aunt     Past Surgical History:  Procedure Laterality Date   ABSCESS DRAINAGE     BREAST BIOPSY Left    [place] Core bx, 2010?, pt not sure when    ROS: Review of Systems Negative except as stated above  PHYSICAL EXAM: BP 113/75 (BP Location: Left Arm, Patient Position: Sitting, Cuff Size: Normal)   Pulse 85   Ht 5\' 5"  (1.651 m)   Wt 214 lb (97.1 kg)   LMP 02/12/2018 (Approximate)   SpO2 97%   BMI 35.61 kg/m   Physical Exam  General appearance - alert, well appearing, and in no distress Mental status - normal mood, behavior, speech, dress, motor activity, and thought processes Neck - supple, no significant adenopathy Chest - clear to  auscultation, no wheezes, rales or rhonchi, symmetric air entry Heart - normal rate, regular rhythm, normal S1, S2, no murmurs, rubs, clicks or gallops Extremities - peripheral pulses normal, no pedal edema, no clubbing or cyanosis Diabetic Foot Exam - Simple   Simple Foot Form Diabetic Foot exam was performed with the following findings: Yes 03/12/2023  2:01 PM  Visual Inspection See comments: Yes Sensation Testing Intact to touch and monofilament testing bilaterally: Yes Pulse Check Posterior Tibialis and Dorsalis pulse intact bilaterally: Yes Comments She has noninflamed bunions of both big toes.  She has hammertoe of the right second toe that crosses over onto the first toe.         Latest Ref Rng & Units 03/11/2022   11:45 AM 06/17/2021   11:32 AM 05/23/2020    9:50 AM  CMP  Glucose 70 - 99 mg/dL 409  811  914   BUN 6 - 24 mg/dL 11  11  8    Creatinine 0.57 - 1.00 mg/dL 7.82  9.56  2.13   Sodium 134 - 144 mmol/L 137  132  139   Potassium 3.5 - 5.2 mmol/L 4.2  4.1  4.1   Chloride 96 - 106 mmol/L 96  95  101   CO2 20 - 29 mmol/L 22  25  24    Calcium 8.7 - 10.2 mg/dL 9.3  9.5  9.2   Total Protein 6.0 - 8.5 g/dL 6.7  6.9  6.8   Total Bilirubin 0.0 - 1.2 mg/dL 0.5  0.5  0.3   Alkaline Phos 44 - 121 IU/L 100  117  87   AST 0 - 40 IU/L 21  20  13    ALT 0 - 32 IU/L 25  28  19     Lipid Panel     Component Value Date/Time   CHOL 233 (H) 06/17/2021 1132   TRIG 460 (H) 06/17/2021 1132   TRIG 162 (H) 12/11/2015 0906   HDL 46 06/17/2021  1132   CHOLHDL 5.1 (H) 06/17/2021 1132   CHOLHDL 4.8 09/25/2014 0912   VLDL 56 (H) 09/25/2014 0912   LDLCALC 108 (H) 06/17/2021 1132    CBC    Component Value Date/Time   WBC 6.9 06/17/2021 1132   WBC 6.6 03/18/2010 2119   RBC 5.55 (H) 06/17/2021 1132   RBC 4.67 03/18/2010 2119   HGB 16.0 (H) 06/17/2021 1132   HCT 48.3 (H) 06/17/2021 1132   PLT 309 06/17/2021 1132   MCV 87 06/17/2021 1132   MCH 28.8 06/17/2021 1132   MCH 30.1  12/08/2009 1329   MCHC 33.1 06/17/2021 1132   MCHC 31.7 03/18/2010 2119   RDW 12.7 06/17/2021 1132   LYMPHSABS 2.6 03/18/2010 2119   MONOABS 0.5 03/18/2010 2119   EOSABS 0.2 03/18/2010 2119   BASOSABS 0.0 03/18/2010 2119    ASSESSMENT AND PLAN: 1. Type 2 diabetes mellitus with obesity (HCC) A1c improved but not at goal. -Continue Glipizide 5mg  BID and Metformin BID. -Encourage consistent medication adherence with use of pill boxes for AM and PM medications. -Order blood glucose monitoring device and test strips. -Advise patient to check blood sugars once daily before breakfast. - POCT glycosylated hemoglobin (Hb A1C) - POCT glucose (manual entry) - glipiZIDE (GLUCOTROL) 5 MG tablet; Take 1 tablet (5 mg total) by mouth 2 (two) times daily before a meal.  Dispense: 180 tablet; Refill: 1 - Blood Glucose Monitoring Suppl (TRUE METRIX METER) w/Device KIT; Use as directed to check for blood sugar before meals and at bedtime.  Dispense: 1 kit; Refill: 0 - glucose blood (TRUE METRIX BLOOD GLUCOSE TEST) test strip; Use as instructed  Dispense: 100 each; Refill: 12 - CBC - Comprehensive metabolic panel - Lipid panel  2. Diabetes mellitus treated with oral medication (HCC) See #1 above  3. Hypertension associated with type 2 diabetes mellitus (HCC) Controlled.  Continue Norvasc. - amLODipine (NORVASC) 5 MG tablet; Take 1 tablet (5 mg total) by mouth daily (Patient needs to keep upcoming appointment for further refills.)  Dispense: 90 tablet; Refill: 1  4. Hyperlipidemia associated with type 2 diabetes mellitus (HCC) Continue atorvastatin 40 mg daily.  Check lipid profile today.  5. Primary osteoarthritis of both knees Discussed the importance of weight loss to help take some of the mechanical strain of the knees.  Advised to take Mobic 15 mg once a day as prescribed not twice a day.  I have given a limited supply of tramadol to use as needed.  Advised patient that we can do a controlled  substance prescribing agreement so that she gets it on a regular basis but this means she would have to keep regular appointments.  She does not want to commit to coming in every 3 to 4 months due to limited finances.  [place] controlled substance reporting system reviewed. - traMADol (ULTRAM) 50 MG tablet; Take 1 tablet (50 mg total) by mouth every 12 (twelve) hours as needed. Use sparingly  Dispense: 30 tablet; Refill: 0 - meloxicam (MOBIC) 15 MG tablet; Take 1 tablet (15 mg total) by mouth daily.  Dispense: 90 tablet; Refill: 1  6. Acute vaginitis Will treat empirically for yeast. - fluconazole (DIFLUCAN) 150 MG tablet; Take 1 tablet (150 mg total) by mouth daily.  Dispense: 1 tablet; Refill: 2  7. Dysuria UA does not look like UTI.  However based on her symptoms we will check urine culture - POCT URINALYSIS DIP (CLINITEK) - Urine Culture  8. Acquired deformity of both feet  Advised to apply for Medicaid.  If approved we can refer her to podiatry  9. Influenza vaccination declined   10. Need for shingles vaccine Prescription given for her to get first Shingrix vaccine at pharmacy - Zoster Vaccine Adjuvanted [facility]) injection; Inject 0.5 mLs into the muscle once for 1 dose.  Dispense: 0.5 mL; Refill: 0  11. Screening for colon cancer - Fecal occult blood, imunochemical([facility])   Patient was given the opportunity to ask questions.  Patient verbalized understanding of the plan and was able to repeat key elements of the plan.   This documentation was completed using Paediatric nurse.  Any transcriptional errors are unintentional.  Orders Placed This Encounter  Procedures   Fecal occult blood, imunochemical([facility])   Urine Culture   CBC   Comprehensive metabolic panel   Lipid panel   POCT glycosylated hemoglobin (Hb A1C)   POCT glucose (manual entry)   POCT URINALYSIS DIP (CLINITEK)     Requested Prescriptions   Signed  Prescriptions Disp Refills   amLODipine (NORVASC) 5 MG tablet 90 tablet 1    Sig: Take 1 tablet (5 mg total) by mouth daily (Patient needs to keep upcoming appointment for further refills.)   traMADol (ULTRAM) 50 MG tablet 30 tablet 0    Sig: Take 1 tablet (50 mg total) by mouth every 12 (twelve) hours as needed. Use sparingly   glipiZIDE (GLUCOTROL) 5 MG tablet 180 tablet 1    Sig: Take 1 tablet (5 mg total) by mouth 2 (two) times daily before a meal.   meloxicam (MOBIC) 15 MG tablet 90 tablet 1    Sig: Take 1 tablet (15 mg total) by mouth daily.   Blood Glucose Monitoring Suppl (TRUE METRIX METER) w/Device KIT 1 kit 0    Sig: Use as directed to check for blood sugar before meals and at bedtime.   glucose blood (TRUE METRIX BLOOD GLUCOSE TEST) test strip 100 each 12    Sig: Use as instructed   fluconazole (DIFLUCAN) 150 MG tablet 1 tablet 2    Sig: Take 1 tablet (150 mg total) by mouth daily.   Zoster Vaccine Adjuvanted [facility]) injection 0.5 mL 0    Sig: Inject 0.5 mLs into the muscle once for 1 dose.    Return in about 4 months (around 07/13/2023).  Jonah Blue, MD, FACP

## 2023-03-13 LAB — LIPID PANEL
Chol/HDL Ratio: 3.6 {ratio} (ref 0.0–4.4)
Cholesterol, Total: 146 mg/dL (ref 100–199)
HDL: 41 mg/dL (ref >39)
LDL Chol Calc (NIH): 75 mg/dL (ref 0–99)
Triglycerides: 178 mg/dL — ABNORMAL HIGH (ref 0–149)
VLDL Cholesterol Cal: 30 mg/dL (ref 5–40)

## 2023-03-13 LAB — CBC
Hematocrit: 44.4 % (ref 34.0–46.6)
Hemoglobin: 14.5 g/dL (ref 11.1–15.9)
MCH: 29.1 pg (ref 26.6–33.0)
MCHC: 32.7 g/dL (ref 31.5–35.7)
MCV: 89 fL (ref 79–97)
Platelets: 284 10*3/uL (ref 150–450)
RBC: 4.99 x10E6/uL (ref 3.77–5.28)
RDW: 12.3 % (ref 11.7–15.4)
WBC: 5.5 10*3/uL (ref 3.4–10.8)

## 2023-03-13 LAB — COMPREHENSIVE METABOLIC PANEL
ALT: 21 [IU]/L (ref 0–32)
AST: 18 [IU]/L (ref 0–40)
Albumin: 4.2 g/dL (ref 3.8–4.9)
Alkaline Phosphatase: 88 [IU]/L (ref 44–121)
BUN/Creatinine Ratio: 34 — ABNORMAL HIGH (ref 9–23)
BUN: 17 mg/dL (ref 6–24)
Bilirubin Total: 0.5 mg/dL (ref 0.0–1.2)
CO2: 23 mmol/L (ref 20–29)
Calcium: 9.1 mg/dL (ref 8.7–10.2)
Chloride: 98 mmol/L (ref 96–106)
Creatinine, Ser: 0.5 mg/dL — ABNORMAL LOW (ref 0.57–1.00)
Globulin, Total: 2.4 g/dL (ref 1.5–4.5)
Glucose: 173 mg/dL — ABNORMAL HIGH (ref 70–99)
Potassium: 4.4 mmol/L (ref 3.5–5.2)
Sodium: 135 mmol/L (ref 134–144)
Total Protein: 6.6 g/dL (ref 6.0–8.5)
eGFR: 110 mL/min/{1.73_m2} (ref >59)

## 2023-03-14 LAB — URINE CULTURE

## 2023-03-15 ENCOUNTER — Other Ambulatory Visit: Payer: Self-pay

## 2023-03-16 ENCOUNTER — Other Ambulatory Visit: Payer: Self-pay

## 2023-03-17 ENCOUNTER — Other Ambulatory Visit (HOSPITAL_COMMUNITY)
Admission: RE | Admit: 2023-03-17 | Discharge: 2023-03-17 | Disposition: A | Discharge status: Home / Self Care | Payer: Self-pay | Source: Ambulatory Visit | Attending: Internal Medicine | Admitting: Internal Medicine

## 2023-03-17 ENCOUNTER — Other Ambulatory Visit: Payer: Self-pay | Admitting: Internal Medicine

## 2023-03-17 DIAGNOSIS — N760 Acute vaginitis: Secondary | ICD-10-CM

## 2023-03-19 ENCOUNTER — Other Ambulatory Visit: Payer: Self-pay

## 2023-03-19 LAB — FECAL OCCULT BLOOD, IMMUNOCHEMICAL: Fecal Occult Bld: NEGATIVE

## 2023-03-22 ENCOUNTER — Other Ambulatory Visit: Payer: Self-pay

## 2023-03-22 ENCOUNTER — Ambulatory Visit: Payer: Self-pay | Attending: Internal Medicine

## 2023-03-23 ENCOUNTER — Other Ambulatory Visit: Payer: Self-pay | Admitting: Internal Medicine

## 2023-03-23 DIAGNOSIS — N760 Acute vaginitis: Secondary | ICD-10-CM

## 2023-03-23 LAB — CERVICOVAGINAL ANCILLARY ONLY
Bacterial Vaginitis (gardnerella): NEGATIVE
Candida Glabrata: NEGATIVE
Candida Vaginitis: POSITIVE — AB
Chlamydia: NEGATIVE
Comment: NEGATIVE
Comment: NEGATIVE
Comment: NEGATIVE
Comment: NEGATIVE
Comment: NEGATIVE
Comment: NORMAL
Neisseria Gonorrhea: NEGATIVE
Trichomonas: NEGATIVE

## 2023-03-23 MED ORDER — FLUCONAZOLE 150 MG PO TABS
150.0000 mg | ORAL_TABLET | Freq: Every day | ORAL | 0 refills | Status: DC
  Filled 2023-03-23: qty 3, 3d supply, fill #0

## 2023-03-24 ENCOUNTER — Other Ambulatory Visit: Payer: Self-pay

## 2023-03-25 ENCOUNTER — Telehealth: Payer: Self-pay | Admitting: Internal Medicine

## 2023-03-25 NOTE — Telephone Encounter (Signed)
Pt. Given lab results and instructions.Verbalizes understanding.

## 2023-04-02 ENCOUNTER — Other Ambulatory Visit: Payer: Self-pay

## 2023-04-05 ENCOUNTER — Other Ambulatory Visit: Payer: Self-pay

## 2023-04-07 ENCOUNTER — Other Ambulatory Visit: Payer: Self-pay

## 2023-04-09 ENCOUNTER — Other Ambulatory Visit: Payer: Self-pay

## 2023-04-15 ENCOUNTER — Other Ambulatory Visit: Payer: Self-pay

## 2023-04-27 ENCOUNTER — Other Ambulatory Visit: Payer: Self-pay

## 2023-05-03 ENCOUNTER — Other Ambulatory Visit: Payer: Self-pay | Admitting: Obstetrics and Gynecology

## 2023-05-03 DIAGNOSIS — Z1231 Encounter for screening mammogram for malignant neoplasm of breast: Secondary | ICD-10-CM

## 2023-05-24 ENCOUNTER — Other Ambulatory Visit: Payer: Self-pay | Admitting: Pharmacist

## 2023-05-24 ENCOUNTER — Other Ambulatory Visit: Payer: Self-pay

## 2023-05-24 MED ORDER — ZOSTER VAC RECOMB ADJUVANTED 50 MCG/0.5ML IM SUSR
0.5000 mL | Freq: Once | INTRAMUSCULAR | 1 refills | Status: AC
  Filled 2023-05-24: qty 0.5, 1d supply, fill #0

## 2023-05-31 ENCOUNTER — Other Ambulatory Visit: Payer: Self-pay

## 2023-06-14 ENCOUNTER — Other Ambulatory Visit: Payer: Self-pay

## 2023-06-29 ENCOUNTER — Other Ambulatory Visit: Payer: Self-pay

## 2023-07-22 ENCOUNTER — Ambulatory Visit
Admission: RE | Admit: 2023-07-22 | Discharge: 2023-07-22 | Disposition: A | Discharge status: Home / Self Care | Payer: No Typology Code available for payment source | Source: Ambulatory Visit | Attending: Obstetrics and Gynecology | Admitting: Obstetrics and Gynecology

## 2023-07-22 ENCOUNTER — Ambulatory Visit: Payer: Self-pay | Admitting: *Deleted

## 2023-07-22 VITALS — BP 145/102 | Wt 230.0 lb

## 2023-07-22 DIAGNOSIS — Z1231 Encounter for screening mammogram for malignant neoplasm of breast: Secondary | ICD-10-CM

## 2023-07-22 DIAGNOSIS — Z01419 Encounter for gynecological examination (general) (routine) without abnormal findings: Secondary | ICD-10-CM

## 2023-07-22 NOTE — Progress Notes (Addendum)
[name] is a 58 y.o. G1P0010 female who presents to [facility] clinic today with no complaints.    Pap Smear: Pap smear completed today. Last Pap smear was 03/08/2018 at [facility] and normal with negative HPV. Per patient has no history of an abnormal Pap smear. Last two Pap smear results are in Epic.    Physical exam: Breasts Breasts symmetrical. No skin abnormalities bilateral breasts. A small blackhead bump observed center of chest that per patient has decreased in size from last year. No nipple retraction bilateral breasts. No nipple discharge bilateral breasts. No lymphadenopathy. No lumps palpated bilateral breasts. No complaints of pain or tenderness on exam.      MS DIGITAL SCREENING TOMO BILATERAL Result Date: 07/20/2022 CLINICAL DATA:  Screening. EXAM: DIGITAL SCREENING BILATERAL MAMMOGRAM WITH TOMOSYNTHESIS AND CAD TECHNIQUE: Bilateral screening digital craniocaudal and mediolateral oblique mammograms were obtained. Bilateral screening digital breast tomosynthesis was performed. The images were evaluated with computer-aided detection. COMPARISON:  Previous exam(s). ACR Breast Density Category b: There are scattered areas of fibroglandular density. FINDINGS: There are no findings suspicious for malignancy. IMPRESSION: No mammographic evidence of malignancy. A result letter of this screening mammogram will be mailed directly to the patient. RECOMMENDATION: Screening mammogram in one year. (Code:SM-B-01Y) BI-RADS CATEGORY  1: Negative. Electronically Signed   By: Sherron Ales M.D.   On: 07/20/2022 17:15   MS DIGITAL DIAG TOMO UNI LEFT Result Date: 07/07/2021 CLINICAL DATA:  58 year old female presenting as a recall from screening for possible left breast asymmetry. EXAM: DIGITAL DIAGNOSTIC UNILATERAL LEFT MAMMOGRAM WITH TOMOSYNTHESIS AND CAD TECHNIQUE: Left digital diagnostic mammography and breast tomosynthesis was performed. The images were evaluated with computer-aided  detection. COMPARISON:  Previous exam(s). ACR Breast Density Category c: The breast tissue is heterogeneously dense, which may obscure small masses. FINDINGS: Spot compression tomosynthesis MLO and full field mL tomosynthesis views of the left breast were performed for a questioned asymmetry seen only on MLO view in the retroareolar left breast. On the additional imaging the tissue in this area disperses without persistent asymmetry, mass, or distortion. There are no new findings elsewhere in the left breast. IMPRESSION: No mammographic evidence of malignancy in the left breast. RECOMMENDATION: Screening mammogram in one year.(Code:SM-B-01Y) I have discussed the findings and recommendations with the patient. If applicable, a reminder letter will be sent to the patient regarding the next appointment. BI-RADS CATEGORY  1: Negative. Electronically Signed   By: Emmaline Kluver M.D.   On: 07/07/2021 09:42   MM 3D SCREEN BREAST BILATERAL Result Date: 06/25/2021 CLINICAL DATA:  Screening. EXAM: DIGITAL SCREENING BILATERAL MAMMOGRAM WITH TOMOSYNTHESIS AND CAD TECHNIQUE: Bilateral screening digital craniocaudal and mediolateral oblique mammograms were obtained. Bilateral screening digital breast tomosynthesis was performed. The images were evaluated with computer-aided detection. COMPARISON:  Previous exam(s). ACR Breast Density Category c: The breast tissue is heterogeneously dense, which may obscure small masses. FINDINGS: In the left breast, a possible asymmetry warrants further evaluation. In the right breast, no findings suspicious for malignancy. IMPRESSION: Further evaluation is suggested for possible asymmetry in the left breast. RECOMMENDATION: Diagnostic mammogram and possibly ultrasound of the left breast. (Code:[id number]-45M) The patient will be contacted regarding the findings, and additional imaging will be scheduled. BI-RADS CATEGORY  0: Incomplete. Need additional imaging evaluation and/or prior mammograms  for comparison. Electronically Signed   By: Sherian Rein M.D.   On: 06/25/2021 13:55   MS DIGITAL DIAG TOMO UNI LEFT Result Date: 07/05/2020 CLINICAL DATA:  Possible asymmetry in the outer  left breast in the craniocaudal projection of the recent screening mammogram. EXAM: DIGITAL DIAGNOSTIC UNILATERAL LEFT MAMMOGRAM WITH TOMO AND CAD TECHNIQUE: Left digital diagnostic mammography and breast tomosynthesis was performed. Digital images of the breasts were evaluated with computer-aided detection. COMPARISON:  Previous exam(s). ACR Breast Density Category c: The breast tissue is heterogeneously dense, which may obscure small masses. FINDINGS: 3D tomographic and 2D generated true lateral and spot compression craniocaudal images of the left breast demonstrate normal appearing fibroglandular tissue at the location of the recently suspected asymmetry, unchanged compared previous examinations. IMPRESSION: No evidence of malignancy. The recently suspected left breast asymmetry was close apposition of normal breast tissue. RECOMMENDATION: Bilateral screening mammogram in 1 year. I have discussed the findings and recommendations with the patient. If applicable, a reminder letter will be sent to the patient regarding the next appointment. BI-RADS CATEGORY  1: Negative. Electronically Signed   By: Beckie Salts M.D.   On: 07/05/2020 09:44   MS DIGITAL SCREENING TOMO BILATERAL Result Date: 06/14/2020 CLINICAL DATA:  Screening. EXAM: DIGITAL SCREENING BILATERAL MAMMOGRAM WITH TOMO AND CAD COMPARISON:  Previous exam(s). ACR Breast Density Category c: The breast tissue is heterogeneously dense, which may obscure small masses. FINDINGS: In the left breast, a possible asymmetry warrants further evaluation. In the right breast, no findings suspicious for malignancy. Images were processed with CAD. IMPRESSION: Further evaluation is suggested for possible asymmetry in the left breast. RECOMMENDATION: Diagnostic mammogram and possibly  ultrasound of the left breast. (Code:FI-L-1M) The patient will be contacted regarding the findings, and additional imaging will be scheduled. BI-RADS CATEGORY  0: Incomplete. Need additional imaging evaluation and/or prior mammograms for comparison. Electronically Signed   By: Elberta Fortis M.D.   On: 06/14/2020 07:53   MS DIGITAL DIAG TOMO BILAT Result Date: 05/02/2019 CLINICAL DATA:  58 year old female with history of infected/inflamed sebaceous cysts presents with palpable area of concern in the left axilla which she no longer feels. EXAM: DIGITAL DIAGNOSTIC BILATERAL MAMMOGRAM WITH CAD AND TOMO BILATERAL BREAST ULTRASOUND COMPARISON:  Previous exam(s). ACR Breast Density Category c: The breast tissue is heterogeneously dense, which may obscure small masses. FINDINGS: There is an asymmetry within the upper slightly inner right breast measuring approximately 0.8 cm. No suspicious masses or calcifications are seen in the left breast. Mammographic images were processed with CAD. Targeted ultrasound of the upper and on upper inner right breast was performed demonstrating several small cysts and clusters of cysts. A cluster of cysts at 1 o'clock 6 cm from nipple measures 0.4 x 0.2 x 0.8 cm. This is felt to correspond well with the asymmetry within the upper slightly inner right breast at mammography. Targeted ultrasound of the left axilla was performed. No suspicious masses or abnormality seen, only scarring related to patient's prior excision in this location. IMPRESSION: No findings of malignancy in either breast. RECOMMENDATION: Screening mammogram in one year.(Code:SM-B-01Y) I have discussed the findings and recommendations with the patient. If applicable, a reminder letter will be sent to the patient regarding the next appointment. BI-RADS CATEGORY  2: Benign. Electronically Signed   By: Edwin Cap M.D.   On: 05/02/2019 10:41    Pelvic/Bimanual Ext Genitalia No lesions, no swelling and no discharge  observed on external genitalia.        Vagina Vagina pink and normal texture. No lesions or discharge observed in vagina.        Cervix Cervix is present. Cervix pink and of normal texture. No discharge observed.    Uterus  Uterus is present and palpable. Uterus in normal position and normal size.        Adnexae Bilateral ovaries present and palpable. No tenderness on palpation.         Rectovaginal No rectal exam completed today since patient had no rectal complaints. No skin abnormalities observed on exam.     Smoking History: Patient is former smoker that quit 09/24/1994.    Patient Navigation: Patient education provided. Access to services provided for patient through [facility] program. Taxi transportation provided home from appointment.  Colorectal Cancer Screening: Per patient had a colonoscopy completed 29 years ago. FIT Test completed 03/17/2023, 06/17/2021, 08/29/2019, and 03/14/2018 and negative. No complaints today.    Breast and Cervical Cancer Risk Assessment: Patient has a family history of her mother and a paternal aunt having breast cancer. Patient has no known genetic mutations or history of radiation treatment to the chest before age 24. Patient does not have history of cervical dysplasia, immunocompromised, or DES exposure in-utero.   Risk Scores as of Encounter on 07/22/2023     Alexis Mooney           5-year 1.02%   Lifetime 6.88%            Last calculated by Alexis Mooney, CMA on 07/22/2023 at  9:41 AM        A: [facility] exam with pap smear No complaints.  P: Referred patient to the [facility] for a screening mammogram on mobile. Appointment scheduled Thursday, July 22, 2023 at 1030.  Priscille Heidelberg, RN 07/22/2023 9:54 AM

## 2023-07-22 NOTE — Patient Instructions (Signed)
Explained breast self awareness with [name]. Pap smear completed today.  Let her know [facility] will cover Pap smears and HPV typing every 5 years unless has a history of abnormal Pap smears. Referred patient to the [facility] for a screening mammogram on mobile. Appointment scheduled Thursday, July 22, 2023 at 1030. Patient aware of appointment and will be there. Let patient know will follow up with her within the next couple weeks with results of Pap smear by letter or phone. Informed patient that the Breast Center will follow up with her within the next couple of weeks with results of her mammogram by letter or phone. Alexis Mooney verbalized understanding.  Alexis Mooney, Alexis Maser, RN 9:54 AM

## 2023-07-25 LAB — CYTOLOGY - PAP
Comment: NEGATIVE
Diagnosis: NEGATIVE
High risk HPV: NEGATIVE

## 2023-07-28 ENCOUNTER — Other Ambulatory Visit: Payer: Self-pay | Admitting: Physician Assistant

## 2023-07-28 ENCOUNTER — Other Ambulatory Visit: Payer: Self-pay

## 2023-07-28 DIAGNOSIS — E1169 Type 2 diabetes mellitus with other specified complication: Secondary | ICD-10-CM

## 2023-07-28 DIAGNOSIS — E669 Obesity, unspecified: Secondary | ICD-10-CM

## 2023-07-28 MED ORDER — METFORMIN HCL 1000 MG PO TABS
1000.0000 mg | ORAL_TABLET | Freq: Two times a day (BID) | ORAL | 1 refills | Status: DC
  Filled 2023-07-28 – 2023-11-30 (×2): qty 180, 90d supply, fill #0

## 2023-07-28 MED ORDER — ATORVASTATIN CALCIUM 40 MG PO TABS
40.0000 mg | ORAL_TABLET | Freq: Every day | ORAL | 6 refills | Status: DC
  Filled 2023-07-28 – 2023-09-21 (×3): qty 30, 30d supply, fill #0
  Filled 2023-10-26 (×2): qty 30, 30d supply, fill #1
  Filled 2023-11-30 – 2024-01-06 (×2): qty 30, 30d supply, fill #2
  Filled 2024-02-18: qty 30, 30d supply, fill #3
  Filled 2024-03-20: qty 30, 30d supply, fill #4
  Filled 2024-04-13: qty 30, 30d supply, fill #0
  Filled 2024-04-13: qty 30, 30d supply, fill #5
  Filled 2024-04-26: qty 30, 30d supply, fill #0

## 2023-07-29 ENCOUNTER — Other Ambulatory Visit: Payer: Self-pay

## 2023-07-29 ENCOUNTER — Other Ambulatory Visit (HOSPITAL_COMMUNITY): Payer: Self-pay

## 2023-07-30 ENCOUNTER — Other Ambulatory Visit: Payer: Self-pay

## 2023-07-30 ENCOUNTER — Other Ambulatory Visit (HOSPITAL_COMMUNITY): Payer: Self-pay

## 2023-08-03 ENCOUNTER — Other Ambulatory Visit: Payer: Self-pay

## 2023-08-06 ENCOUNTER — Other Ambulatory Visit: Payer: Self-pay

## 2023-08-06 ENCOUNTER — Other Ambulatory Visit (HOSPITAL_COMMUNITY): Payer: Self-pay

## 2023-08-10 ENCOUNTER — Other Ambulatory Visit: Payer: Self-pay

## 2023-08-13 ENCOUNTER — Other Ambulatory Visit (HOSPITAL_COMMUNITY): Payer: Self-pay

## 2023-08-13 ENCOUNTER — Other Ambulatory Visit: Payer: Self-pay

## 2023-08-13 ENCOUNTER — Other Ambulatory Visit (HOSPITAL_BASED_OUTPATIENT_CLINIC_OR_DEPARTMENT_OTHER): Payer: Self-pay

## 2023-08-27 ENCOUNTER — Other Ambulatory Visit: Payer: Self-pay

## 2023-09-21 ENCOUNTER — Other Ambulatory Visit: Payer: Self-pay

## 2023-09-21 ENCOUNTER — Other Ambulatory Visit: Payer: Self-pay | Admitting: Internal Medicine

## 2023-09-21 DIAGNOSIS — I152 Hypertension secondary to endocrine disorders: Secondary | ICD-10-CM

## 2023-09-21 DIAGNOSIS — E1169 Type 2 diabetes mellitus with other specified complication: Secondary | ICD-10-CM

## 2023-09-22 ENCOUNTER — Other Ambulatory Visit: Payer: Self-pay

## 2023-09-22 MED ORDER — GLIPIZIDE 5 MG PO TABS
5.0000 mg | ORAL_TABLET | Freq: Two times a day (BID) | ORAL | 0 refills | Status: DC
  Filled 2023-09-22: qty 60, 30d supply, fill #0

## 2023-09-22 MED ORDER — AMLODIPINE BESYLATE 5 MG PO TABS
5.0000 mg | ORAL_TABLET | Freq: Every day | ORAL | 0 refills | Status: DC
  Filled 2023-09-22: qty 30, 30d supply, fill #0

## 2023-10-26 ENCOUNTER — Other Ambulatory Visit: Payer: Self-pay | Admitting: Internal Medicine

## 2023-10-26 ENCOUNTER — Other Ambulatory Visit (HOSPITAL_BASED_OUTPATIENT_CLINIC_OR_DEPARTMENT_OTHER): Payer: Self-pay

## 2023-10-26 ENCOUNTER — Other Ambulatory Visit: Payer: Self-pay

## 2023-10-26 DIAGNOSIS — E1159 Type 2 diabetes mellitus with other circulatory complications: Secondary | ICD-10-CM

## 2023-10-26 DIAGNOSIS — E1169 Type 2 diabetes mellitus with other specified complication: Secondary | ICD-10-CM

## 2023-10-26 DIAGNOSIS — M170 Bilateral primary osteoarthritis of knee: Secondary | ICD-10-CM

## 2023-10-29 ENCOUNTER — Encounter: Payer: Self-pay | Admitting: Pharmacist

## 2023-10-29 ENCOUNTER — Other Ambulatory Visit: Payer: Self-pay

## 2023-11-23 ENCOUNTER — Ambulatory Visit: Payer: Self-pay

## 2023-11-23 ENCOUNTER — Other Ambulatory Visit: Payer: Self-pay

## 2023-11-23 DIAGNOSIS — I152 Hypertension secondary to endocrine disorders: Secondary | ICD-10-CM

## 2023-11-23 MED ORDER — AMLODIPINE BESYLATE 5 MG PO TABS: 5.0000 mg | ORAL_TABLET | Freq: Every day | ORAL | 0 refills | Status: DC

## 2023-11-23 MED ORDER — AMLODIPINE BESYLATE 5 MG PO TABS
5.0000 mg | ORAL_TABLET | Freq: Every day | ORAL | 1 refills | Status: DC
  Filled 2023-11-30: qty 30, 30d supply, fill #0

## 2023-11-23 NOTE — Addendum Note (Signed)
 Addended by: Concetta Dee B on: 11/23/2023 06:27 PM   Modules accepted: Orders

## 2023-11-23 NOTE — Telephone Encounter (Signed)
 FYI Only or Action Required?: FYI only for provider  Patient was last seen in primary care on 03/12/2023 by Lawrance Presume, MD. Called Nurse Triage reporting Abdominal Pain. Symptoms began yesterday. Interventions attempted: Nothing. Symptoms are: unchanged.  Triage Disposition: Home Care  Patient/caregiver understands and will follow disposition?: Yes              Copied from CRM [id number]. Topic: Clinical - Red Word Triage >> Nov 23, 2023 10:21 AM Star [place] wrote: Red Word that prompted transfer to Nurse Triage: pain in between breasts, level 6 pain started last night Reason for Disposition  Abdominal pain  Answer Assessment - Initial Assessment Questions 1. LOCATION: Where does it hurt?      Upper abd 2. RADIATION: Does the pain shoot anywhere else? (e.g., chest, back)     no 3. ONSET: When did the pain begin? (e.g., minutes, hours or days ago)      yesterday 4. SUDDEN: Gradual or sudden onset?     slow 5. PATTERN Does the pain come and go, or is it constant?    - If it comes and goes: How long does it last? Do you have pain now?     (Note: Comes and goes means the pain is intermittent. It goes away completely between bouts.)    - If constant: Is it getting better, staying the same, or getting worse?      (Note: Constant means the pain never goes away completely; most serious pain is constant and gets worse.)      constant 6. SEVERITY: How bad is the pain?  (e.g., Scale 1-10; mild, moderate, or severe)    - MILD (1-3): Doesn't interfere with normal activities, abdomen soft and not tender to touch..     - MODERATE (4-7): Interferes with normal activities or awakens from sleep, abdomen tender to touch.     - SEVERE (8-10): Excruciating pain, doubled over, unable to do any normal activities.       6/10 7. RECURRENT SYMPTOM: Have you ever had this type of stomach pain before? If Yes, ask: When was the last time? and What happened that time?       Yes-a while ago 8. AGGRAVATING FACTORS: Does anything seem to cause this pain? (e.g., foods, stress, alcohol )     Eat something spicy yesterday before pain started.  9. CARDIAC SYMPTOMS: Do you have any of the following symptoms: chest pain, difficulty breathing, sweating, nausea?     no 10. OTHER SYMPTOMS: Do you have any other symptoms? (e.g., back pain, diarrhea, fever, urination pain, vomiting)       no  Protocols used: Abdominal Pain - Upper-A-AH

## 2023-11-23 NOTE — Telephone Encounter (Signed)
 Called & spoke to the patient. Verified name & DOB. Offered patient appointment today for an acute visit. Patient declined due to conflicting work schedule. Patient stated that the pain is on the upper abdominal area and believes it may be acid reflux. Patient is aware of [facility] hours & locations and will go to the bus on Monday 12/29/2023. Patient is requesting a refill for amlodipine  until her next visit on 01/06/2024.

## 2023-11-23 NOTE — Telephone Encounter (Signed)
 For some reason, the prescription for amlodipine  will not go through electronically; keeps printing.  Please call it into her pharmacy.

## 2023-11-24 ENCOUNTER — Other Ambulatory Visit: Payer: Self-pay

## 2023-11-24 ENCOUNTER — Other Ambulatory Visit: Payer: Self-pay | Admitting: Internal Medicine

## 2023-11-24 DIAGNOSIS — I152 Hypertension secondary to endocrine disorders: Secondary | ICD-10-CM

## 2023-11-24 NOTE — Telephone Encounter (Signed)
 Called & spoke to the [facility] pharmacist. Prescription ordered.  Called & spoke to the patient. Verified name & DOB. Informed that prescription was ordered and may pick up prescription from pharmacy. Patient expressed verbal understanding.

## 2023-11-25 ENCOUNTER — Other Ambulatory Visit: Payer: Self-pay

## 2023-11-30 ENCOUNTER — Other Ambulatory Visit: Payer: Self-pay

## 2023-12-01 ENCOUNTER — Other Ambulatory Visit: Payer: Self-pay

## 2023-12-01 ENCOUNTER — Other Ambulatory Visit: Payer: Self-pay | Admitting: Internal Medicine

## 2023-12-01 ENCOUNTER — Other Ambulatory Visit (HOSPITAL_COMMUNITY): Payer: Self-pay

## 2023-12-01 DIAGNOSIS — M170 Bilateral primary osteoarthritis of knee: Secondary | ICD-10-CM

## 2023-12-01 MED ORDER — MELOXICAM 15 MG PO TABS
15.0000 mg | ORAL_TABLET | Freq: Every day | ORAL | 0 refills | Status: DC
  Filled 2023-12-01 (×2): qty 30, 30d supply, fill #0

## 2023-12-03 ENCOUNTER — Other Ambulatory Visit (HOSPITAL_COMMUNITY): Payer: Self-pay

## 2023-12-03 ENCOUNTER — Other Ambulatory Visit: Payer: Self-pay

## 2023-12-03 ENCOUNTER — Other Ambulatory Visit (HOSPITAL_BASED_OUTPATIENT_CLINIC_OR_DEPARTMENT_OTHER): Payer: Self-pay

## 2023-12-06 ENCOUNTER — Other Ambulatory Visit: Payer: Self-pay

## 2023-12-08 ENCOUNTER — Other Ambulatory Visit: Payer: Self-pay

## 2023-12-09 ENCOUNTER — Other Ambulatory Visit: Payer: Self-pay

## 2023-12-21 LAB — HM DIABETES EYE EXAM

## 2024-01-05 ENCOUNTER — Telehealth: Payer: Self-pay | Admitting: Internal Medicine

## 2024-01-05 NOTE — Telephone Encounter (Signed)
 Confirmed appt for 7*/31

## 2024-01-06 ENCOUNTER — Encounter: Payer: Self-pay | Admitting: Internal Medicine

## 2024-01-06 ENCOUNTER — Other Ambulatory Visit (HOSPITAL_COMMUNITY): Payer: Self-pay

## 2024-01-06 ENCOUNTER — Ambulatory Visit: Payer: Self-pay | Attending: Internal Medicine | Admitting: Internal Medicine

## 2024-01-06 ENCOUNTER — Other Ambulatory Visit: Payer: Self-pay

## 2024-01-06 VITALS — BP 115/77 | HR 80 | Temp 98.0°F | Ht 65.0 in | Wt 213.0 lb

## 2024-01-06 DIAGNOSIS — E1169 Type 2 diabetes mellitus with other specified complication: Secondary | ICD-10-CM

## 2024-01-06 DIAGNOSIS — E1159 Type 2 diabetes mellitus with other circulatory complications: Secondary | ICD-10-CM

## 2024-01-06 DIAGNOSIS — M170 Bilateral primary osteoarthritis of knee: Secondary | ICD-10-CM

## 2024-01-06 DIAGNOSIS — E119 Type 2 diabetes mellitus without complications: Secondary | ICD-10-CM

## 2024-01-06 DIAGNOSIS — Z23 Encounter for immunization: Secondary | ICD-10-CM

## 2024-01-06 DIAGNOSIS — E785 Hyperlipidemia, unspecified: Secondary | ICD-10-CM

## 2024-01-06 DIAGNOSIS — Z7985 Long-term (current) use of injectable non-insulin antidiabetic drugs: Secondary | ICD-10-CM

## 2024-01-06 DIAGNOSIS — Z6835 Body mass index (BMI) 35.0-35.9, adult: Secondary | ICD-10-CM

## 2024-01-06 DIAGNOSIS — R1013 Epigastric pain: Secondary | ICD-10-CM

## 2024-01-06 DIAGNOSIS — E669 Obesity, unspecified: Secondary | ICD-10-CM

## 2024-01-06 DIAGNOSIS — M130 Polyarthritis, unspecified: Secondary | ICD-10-CM

## 2024-01-06 DIAGNOSIS — Z7984 Long term (current) use of oral hypoglycemic drugs: Secondary | ICD-10-CM

## 2024-01-06 DIAGNOSIS — I152 Hypertension secondary to endocrine disorders: Secondary | ICD-10-CM

## 2024-01-06 DIAGNOSIS — L290 Pruritus ani: Secondary | ICD-10-CM

## 2024-01-06 LAB — POCT GLYCOSYLATED HEMOGLOBIN (HGB A1C): HbA1c, POC (controlled diabetic range): 9.8 % — AB (ref 0.0–7.0)

## 2024-01-06 LAB — GLUCOSE, POCT (MANUAL RESULT ENTRY): POC Glucose: 196 mg/dL — AB (ref 70–99)

## 2024-01-06 MED ORDER — GLIPIZIDE 5 MG PO TABS
5.0000 mg | ORAL_TABLET | Freq: Two times a day (BID) | ORAL | 1 refills | Status: AC
Start: 2024-01-06 — End: ?

## 2024-01-06 MED ORDER — AMLODIPINE BESYLATE 5 MG PO TABS
5.0000 mg | ORAL_TABLET | Freq: Every day | ORAL | 1 refills | Status: AC
Start: 2024-01-06 — End: ?
  Filled 2024-01-06 – 2024-04-13 (×2): qty 90, 90d supply, fill #0
  Filled 2024-04-13: qty 90, 90d supply, fill #1
  Filled 2024-04-26: qty 90, 90d supply, fill #0

## 2024-01-06 MED ORDER — TRULICITY 0.75 MG/0.5ML ~~LOC~~ SOAJ
0.7500 mg | SUBCUTANEOUS | 2 refills | Status: DC
  Filled 2024-01-06: qty 2, 28d supply, fill #0
  Filled 2024-01-24 (×2): qty 2, 28d supply, fill #1
  Filled 2024-02-18: qty 2, 28d supply, fill #2

## 2024-01-06 MED ORDER — TRAMADOL HCL 50 MG PO TABS: 50.0000 mg | ORAL_TABLET | Freq: Two times a day (BID) | ORAL | 0 refills | Status: DC | PRN

## 2024-01-06 MED ORDER — ZOSTER VAC RECOMB ADJUVANTED 50 MCG/0.5ML IM SUSR: 0.5000 mL | Freq: Once | INTRAMUSCULAR | 0 refills | Status: AC

## 2024-01-06 MED ORDER — METFORMIN HCL 1000 MG PO TABS: 1000.0000 mg | ORAL_TABLET | Freq: Two times a day (BID) | ORAL | 1 refills | Status: AC

## 2024-01-06 MED ORDER — AMLODIPINE BESYLATE 5 MG PO TABS: 5.0000 mg | ORAL_TABLET | Freq: Every day | ORAL | 1 refills | Status: DC

## 2024-01-06 MED ORDER — MELOXICAM 15 MG PO TABS
15.0000 mg | ORAL_TABLET | Freq: Every day | ORAL | 0 refills | Status: DC
Start: 2024-01-06 — End: 2024-02-18
  Filled 2024-01-06: qty 30, 30d supply, fill #0

## 2024-01-06 NOTE — Patient Instructions (Signed)
 VISIT SUMMARY:  Today, you came in for a follow-up appointment to discuss your diabetes, hypertension, hyperlipidemia, arthritis, weight, and some other concerns. We reviewed your current medications, diet, and lifestyle, and made some adjustments to help manage your conditions better.  YOUR PLAN:  -TYPE 2 DIABETES MELLITUS, POORLY CONTROLLED: Your blood sugar levels are not well controlled, as indicated by your A1c level of 9.8%. We discussed starting Trulicity , which can help with weight loss and blood sugar control. Continue taking metformin  1000 mg twice daily and glipizide  5 mg twice daily until Trulicity  is tolerated for one month. Please check your blood sugar once daily, either before breakfast or dinner.  -ESSENTIAL HYPERTENSION: Your blood pressure is being managed with your current medication, and you are following the prescribed regimen. No changes were made today.  -HYPERLIPIDEMIA: You are managing your cholesterol levels with atorvastatin , and you are adhering to the treatment plan. No changes were made today.  -OSTEOARTHRITIS OF KNEES AND HANDS: You have increased pain in your fingers and shoulders, likely due to arthritis. We discussed switching from meloxicam  to Celebrex for pain management. A prescription for Celebrex will be sent to the pharmacy for a price check, and a refill for tramadol  has been provided.  -OBESITY: You are interested in losing weight, and Trulicity  may help with this as well as improve your diabetes control. We will monitor your progress with this new medication.  -PRURITUS ANI, EVALUATION FOR POSSIBLE PARASITIC INFECTION: You have intermittent itching around the anus, which may be due to a parasitic infection. We discussed providing a stool sample for testing to check for parasites.  -GASTROESOPHAGEAL REFLUX SYMPTOMS, RESOLVED: You had a single episode of stomach pain likely due to eating spicy food. To prevent this, avoid spicy and acidic foods. If symptoms  recur, you can take Prilosec as needed.  INSTRUCTIONS:  Please follow up with a stool sample for ova and parasites testing if possible. Continue with your current medications and the new prescriptions provided. Check your blood sugar once daily, and monitor your symptoms. Schedule a follow-up appointment in one month to review your progress with Trulicity  and other treatments.

## 2024-01-06 NOTE — Progress Notes (Signed)
 Patient ID: Alexis Mooney, female    DOB: [birth year]  MRN: [id number]  CC: Diabetes (DM f/u. Med refill. Mikie of upper abdominal pain - nurse advised pt of possible acid reflux/Discuss GLP-1 meds /Discuss pneumonia & shingles vax - cost concern )   Subjective: Alexis Mooney is a 58 y.o. female who presents for chronic ds management. Her concerns today include:  History of DM type 2, HTN, HL, morbid obesity, osteoarthritis of the knees,    Discussed the use of AI scribe software for clinical note transcription with the patient, who gave verbal consent to proceed.  History of Present Illness Alexis Mooney is a 58 year old female with diabetes, hypertension, and hyperlipidemia who presents for follow-up.  DM: Results for orders placed or performed in visit on 01/06/24  POCT glycosylated hemoglobin (Hb A1C)   Collection Time: 01/06/24  9:07 AM  Result Value Ref Range   Hemoglobin A1C     HbA1c POC (<> result, manual entry)     HbA1c, POC (prediabetic range)     HbA1c, POC (controlled diabetic range) 9.8 (A) 0.0 - 7.0 %  POCT glucose (manual entry)   Collection Time: 01/06/24  9:07 AM  Result Value Ref Range   POC Glucose 196 (A) 70 - 99 mg/dl  She is experiencing difficulty managing her diabetes, with her hemoglobin A1c increasing from 9.6% in October to 9.8% currently. She frequently forgets to take her medications, glipizide  5 mg and metformin  1000 mg, as prescribed, often taking them only once a day instead of twice due to her busy work schedule and forgetting to take the evening doses. Would like to be tried with Trulicity  or Ozempic to assist with wgh loss. She does not check her blood sugars regularly due to concerns about finger pain affecting her work. 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 She does not follow a special diet and tends to prepare quick meals due to time constraints. She avoids eating out, sodas, and juices.  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 She reports feeling tired after work. Her weight has slightly decreased from 214 pounds in October to 213 pounds currently. She does not recall being 230 pounds during a Pap test in February and thinks that was an incorrect entry.  She experiences occasional abdominal pain, which occurred after eating spicy food. The pain was described as sharp and aching, located in the epigastric area, and has not recurred since the initial episode last mth. Had spoke to triage nurse and told it was likely GERD.  She reports increased pain in her fingers and shoulders, suspecting arthritis. Her fingers are becoming bent, and she experiences more pain than before. She takes meloxicam  daily and occasionally tramadol  for arthritis pain in knee.  Request RF on both.  She is concerned about possible parasites due to intermittent pruritus around the anus, but has not observed any worms in her stool. She has not traveled outside the [place]  recently and lives with her husband. No known or hx of hemorrhoids.  HM: She had eye exam done through [facility] on 12/21/2023.  She brought a copy of the exam written report and prescription that she was given for  glasses.  She defers on getting the pneumonia vaccine stating that she would need to find out the price.  She is agreeable to getting shingles vaccine through our pharmacy downstairs.  Printed prescription given.    Patient Active Problem List   Diagnosis Date Noted   Influenza vaccine refused 05/23/2020   Bunion of great toe 05/23/2020   23-polyvalent pneumococcal polysaccharide vaccine declined 05/23/2020   Hyperlipidemia associated with type 2  diabetes mellitus (HCC) 07/11/2019   Type 2 diabetes mellitus without complication, without long-term current use of insulin (HCC) 07/11/2019   Screening breast examination 04/25/2019   Axillary lump, left 04/25/2019   Frequency of urination 01/31/2019   Dysuria 01/31/2019   Epigastric pain 01/31/2019   Primary osteoarthritis of both knees 07/06/2018   Memory disorder 02/04/2018   Anxiety and depression 03/19/2017   Hair loss 03/19/2017   High triglycerides 12/29/2016   Perimenopause 01/22/2014   Essential hypertension 01/22/2014     Current Outpatient Medications on File Prior to Visit  Medication Sig Dispense Refill   atorvastatin  (LIPITOR) 40 MG tablet Take 1 tablet (40 mg total) by mouth daily. 30 tablet 6   Blood Glucose Monitoring Suppl (TRUE METRIX METER) w/Device KIT Use as directed to check for blood sugar before meals and at bedtime. (Patient not taking: Reported on 07/22/2023) 1 kit 0   glucose blood (TRUE METRIX BLOOD GLUCOSE TEST) test strip Use as instructed 100 each 12   TRUEplus Lancets 28G MISC Use to check blood sugar 3 times daily. 100 each 6   No current facility-administered medications on file prior to visit.    Allergies  Allergen Reactions   Ampicillin Hives   Bactrim  [Sulfamethoxazole -Trimethoprim ] Swelling   Jardiance  [Empagliflozin ]     Recurrent vaginal itching-yeast.   Latex Rash    Social History   Socioeconomic History   Marital status: Single    Spouse name: Not on file   Number of children: 0   Years of education: Not on file   Highest education level: Bachelor's degree (e.g., BA, AB, BS)  Occupational History   Not on file  Tobacco Use   Smoking status: Former    Current packs/day: 0.00    Types: Cigarettes    Quit date: 09/24/1994    Years since quitting: 29.3   Smokeless tobacco: Never  Vaping Use   Vaping status: Never Used  Substance and Sexual Activity   Alcohol  use: Yes    Comment: rarely   Drug use: No   Sexual activity:  Yes    Birth control/protection: None, Post-menopausal  Other Topics Concern   Not on file  Social History Narrative   Pt was born in [place]   Social Drivers of Health   Financial Resource Strain: Medium Risk (11/07/2022)   Overall Financial Resource Strain (CARDIA)    Difficulty of Paying Living Expenses: Somewhat hard  Food Insecurity: No Food Insecurity (07/22/2023)   Hunger Vital Sign    Worried About Running Out of Food in the Last Year: Never true    Ran Out of Food in the Last Year: Never true  Transportation Needs: Unmet Transportation Needs (07/22/2023)   PRAPARE - Transportation    Lack of Transportation (Medical): Yes    Lack of Transportation (Non-Medical): Yes  Physical Activity: Unknown (11/07/2022)   Exercise Vital Sign    Days of Exercise per Week: 0 days    Minutes of Exercise per Session: Not on file  Stress: Stress Concern Present (11/07/2022)   [employer]  of Occupational Health - Occupational Stress Questionnaire    Feeling of Stress : To some extent  Social Connections: Moderately Isolated (11/07/2022)   Social Connection and Isolation Panel    Frequency of Communication with Friends and Family: Twice a week    Frequency of Social Gatherings with Friends and Family: Twice a week    Attends Religious Services: Never    Database administrator or Organizations: No    Attends Engineer, structural: Not on file    Marital Status: Married  Catering manager Violence: Not At Risk (09/24/2022)   Humiliation, Afraid, Rape, and Kick questionnaire    Fear of Current or Ex-Partner: No    Emotionally Abused: No    Physically Abused: No    Sexually Abused: No    Family History  Problem Relation Age of Onset   Breast cancer Mother 70   Hypertension Mother    Hypertension Father    Cancer Sister 63       stomach   Breast cancer Paternal Aunt        Pt unsure but was heard this in the family   Hypertension Maternal Grandmother    Hypertension Maternal  Grandfather    Breast cancer Cousin        Pt unsure but was heard this in the family    Past Surgical History:  Procedure Laterality Date   ABSCESS DRAINAGE     BREAST BIOPSY Left    US  Core bx, 2010?, pt not sure when    ROS: Review of Systems Negative except as stated above  PHYSICAL EXAM: BP 115/77 (BP Location: Left Arm, Patient Position: Sitting, Cuff Size: Large)   Pulse 80   Temp 98 F (36.7 C) (Oral)   Ht 5' 5 (1.651 m)   Wt 213 lb (96.6 kg)   LMP 02/12/2018 (Approximate)   SpO2 95%   BMI 35.45 kg/m   Wt Readings from Last 3 Encounters:  01/06/24 213 lb (96.6 kg)  07/22/23 230 lb (104.3 kg)  03/12/23 214 lb (97.1 kg)    Physical Exam  General appearance - alert, well appearing, older Hispanic female and in no distress Mental status - normal mood, behavior, speech, dress, motor activity, and thought processes Neck - supple, no significant adenopathy Chest - clear to auscultation, no wheezes, rales or rhonchi, symmetric air entry Heart - normal rate, regular rhythm, normal S1, S2, no murmurs, rubs, clicks or gallops Rectal - no rectal lesions. Skin not dry Musculoskeletal - mild discomfort with PRM LT shoulder. Mild enlargement of DIP jts RT>LT and PIP jts of 5th fingers Extremities - no LE edema      Latest Ref Rng & Units 03/12/2023   11:44 AM 03/11/2022   11:45 AM 06/17/2021   11:32 AM  CMP  Glucose 70 - 99 mg/dL 826  804  813   BUN 6 - 24 mg/dL 17  11  11    Creatinine 0.57 - 1.00 mg/dL 9.49  9.48  9.49   Sodium 134 - 144 mmol/L 135  137  132   Potassium 3.5 - 5.2 mmol/L 4.4  4.2  4.1   Chloride 96 - 106 mmol/L 98  96  95   CO2 20 - 29 mmol/L 23  22  25    Calcium  8.7 - 10.2 mg/dL 9.1  9.3  9.5   Total Protein 6.0 - 8.5 g/dL 6.6  6.7  6.9   Total Bilirubin 0.0 - 1.2 mg/dL 0.5  0.5  0.5   Alkaline Phos 44 - 121 IU/L 88  100  117   AST 0 - 40 IU/L 18  21  20    ALT 0 - 32 IU/L 21  25  28     Lipid Panel     Component Value Date/Time   CHOL 146  03/12/2023 1144   TRIG 178 (H) 03/12/2023 1144   TRIG 162 (H) 12/11/2015 0906   HDL 41 03/12/2023 1144   CHOLHDL 3.6 03/12/2023 1144   CHOLHDL 4.8 09/25/2014 0912   VLDL 56 (H) 09/25/2014 0912   LDLCALC 75 03/12/2023 1144    CBC    Component Value Date/Time   WBC 5.5 03/12/2023 1144   WBC 6.6 03/18/2010 2119   RBC 4.99 03/12/2023 1144   RBC 4.67 03/18/2010 2119   HGB 14.5 03/12/2023 1144   HCT 44.4 03/12/2023 1144   PLT 284 03/12/2023 1144   MCV 89 03/12/2023 1144   MCH 29.1 03/12/2023 1144   MCH 30.1 12/08/2009 1329   MCHC 32.7 03/12/2023 1144   MCHC 31.7 03/18/2010 2119   RDW 12.3 03/12/2023 1144   LYMPHSABS 2.6 03/18/2010 2119   MONOABS 0.5 03/18/2010 2119   EOSABS 0.2 03/18/2010 2119   BASOSABS 0.0 03/18/2010 2119    ASSESSMENT AND PLAN: 1. Type 2 diabetes mellitus with obesity (HCC) (Primary) Not at goal due to inconsistency with taking metformin  and glipizide  twice a day as prescribed. She will try to do better. We discussed trying her with Trulicity  which would be good given that it is only a once weekly injection.  I went over with pt how the medication works and potential side effects including nausea, vomiting, diarrhea/constipation, bowel blockage, palpitations and pancreatitis.  Advised to stop the medicine and be seen if pt develops any abdominal pain, vomiting, severe diarrhea/constipation or palpitations. We will start on the lowest dose of 0.75 mg once a week.  Once she starts the Trulicity , advised to stop Glipizide .  .  Encouraged her to continue healthy eating habits.  Continue to move as much as she can. -copy of eye exam report given to my CMA to update in HM. - POCT glycosylated hemoglobin (Hb A1C) - POCT glucose (manual entry) - glipiZIDE  (GLUCOTROL ) 5 MG tablet; Take 1 tablet (5 mg total) by mouth 2 (two) times daily before a meal.  Dispense: 180 tablet; Refill: 1 - Dulaglutide  (TRULICITY ) 0.75 MG/0.5ML SOAJ; Inject 0.75 mg into the skin once a week.   Dispense: 2 mL; Refill: 2 - metFORMIN  (GLUCOPHAGE ) 1000 MG tablet; Take 1 tablet (1,000 mg total) by mouth 2 (two) times daily.  Dispense: 180 tablet; Refill: 1 - Microalbumin / creatinine urine ratio  2. Diabetes mellitus treated with oral medication (HCC) See #1 above  3. Hypertension associated with type 2 diabetes mellitus (HCC) At goal.  Continue amlodipine  5 mg daily - amLODipine  (NORVASC ) 5 MG tablet; Take 1 tablet (5 mg total) by mouth daily.  Dispense: 90 tablet; Refill: 1  4. Hyperlipidemia associated with type 2 diabetes mellitus (HCC) Continue atorvastatin  40 mg daily.  5. Polyarthritis Including of both knees and now in the hands and likely shoulders.  Continue meloxicam .  Refill given on tramadol  which she uses quite sparingly.  [place]  controlled substance reporting system reviewed. - meloxicam  (MOBIC ) 15 MG tablet; Take 1 tablet (15 mg total) by mouth daily.Please keep upcoming appointment for additional refills.  Dispense: 30 tablet; Refill: 0 - traMADol  (ULTRAM ) 50 MG tablet; Take 1 tablet (50 mg total) by  mouth every 12 (twelve) hours as needed. Use sparingly  Dispense: 30 tablet; Refill: 0  6. Epigastric pain This occurred just 1 time last month after eating spicy food.  Suspect GERD.GERD precautions discussed.  Advised to avoid certain foods like spicy foods, tomato-based foods, juices and excessive caffeine.  Advised to eat his last meal at least 2 to 3 hours before laying down at nights and to sleep with his head slightly elevated.  Advised to purchase Prilosec over-the-counter to use as needed.  8. Need for shingles vaccine Prescription given for her to take to our pharmacy to get first Shingrix  vaccine - Zoster Vaccine Adjuvanted (SHINGRIX ) injection; Inject 0.5 mLs into the muscle once for 1 dose.  Dispense: 0.5 mL; Refill: 0  9. Rectal itching Wanted to get stool to check for O&P the patient was unable to give sample today.  Patient was given the  opportunity to ask questions.  Patient verbalized understanding of the plan and was able to repeat key elements of the plan.   This documentation was completed using Paediatric nurse.  Any transcriptional errors are unintentional.  Orders Placed This Encounter  Procedures   Microalbumin / creatinine urine ratio   POCT glycosylated hemoglobin (Hb A1C)   POCT glucose (manual entry)     Requested Prescriptions   Signed Prescriptions Disp Refills   glipiZIDE  (GLUCOTROL ) 5 MG tablet 180 tablet 1    Sig: Take 1 tablet (5 mg total) by mouth 2 (two) times daily before a meal.   meloxicam  (MOBIC ) 15 MG tablet 30 tablet 0    Sig: Take 1 tablet (15 mg total) by mouth daily.Please keep upcoming appointment for additional refills.   traMADol  (ULTRAM ) 50 MG tablet 30 tablet 0    Sig: Take 1 tablet (50 mg total) by mouth every 12 (twelve) hours as needed. Use sparingly   Dulaglutide  (TRULICITY ) 0.75 MG/0.5ML SOAJ 2 mL 2    Sig: Inject 0.75 mg into the skin once a week.   metFORMIN  (GLUCOPHAGE ) 1000 MG tablet 180 tablet 1    Sig: Take 1 tablet (1,000 mg total) by mouth 2 (two) times daily.   Zoster Vaccine Adjuvanted (SHINGRIX ) injection 0.5 mL 0    Sig: Inject 0.5 mLs into the muscle once for 1 dose.   amLODipine  (NORVASC ) 5 MG tablet 90 tablet 1    Sig: Take 1 tablet (5 mg total) by mouth daily.    Return in about 4 months (around 05/07/2024).  Barnie Louder, MD, FACP

## 2024-01-10 ENCOUNTER — Other Ambulatory Visit: Payer: Self-pay

## 2024-01-10 ENCOUNTER — Ambulatory Visit: Payer: Self-pay | Admitting: Family Medicine

## 2024-01-10 LAB — MICROALBUMIN / CREATININE URINE RATIO
Creatinine, Urine: 69.8 mg/dL
Microalb/Creat Ratio: 29 mg/g{creat} (ref 0–29)
Microalbumin, Urine: 19.9 ug/mL

## 2024-01-24 ENCOUNTER — Other Ambulatory Visit: Payer: Self-pay

## 2024-02-18 ENCOUNTER — Other Ambulatory Visit: Payer: Self-pay | Admitting: Internal Medicine

## 2024-02-18 ENCOUNTER — Other Ambulatory Visit: Payer: Self-pay

## 2024-02-18 DIAGNOSIS — M130 Polyarthritis, unspecified: Secondary | ICD-10-CM

## 2024-02-18 MED ORDER — MELOXICAM 15 MG PO TABS
15.0000 mg | ORAL_TABLET | Freq: Every day | ORAL | 0 refills | Status: DC
Start: 2024-02-18 — End: 2024-03-20
  Filled 2024-02-18: qty 30, 30d supply, fill #0

## 2024-02-21 ENCOUNTER — Other Ambulatory Visit: Payer: Self-pay

## 2024-03-20 ENCOUNTER — Other Ambulatory Visit: Payer: Self-pay

## 2024-03-20 ENCOUNTER — Other Ambulatory Visit: Payer: Self-pay | Admitting: Internal Medicine

## 2024-03-20 ENCOUNTER — Other Ambulatory Visit (HOSPITAL_COMMUNITY): Payer: Self-pay

## 2024-03-20 DIAGNOSIS — M130 Polyarthritis, unspecified: Secondary | ICD-10-CM

## 2024-03-20 DIAGNOSIS — E669 Obesity, unspecified: Secondary | ICD-10-CM

## 2024-03-20 MED ORDER — TRULICITY 0.75 MG/0.5ML ~~LOC~~ SOAJ
0.7500 mg | SUBCUTANEOUS | 0 refills | Status: DC
  Filled 2024-03-20: qty 2, 28d supply, fill #0

## 2024-03-20 MED ORDER — MELOXICAM 15 MG PO TABS
15.0000 mg | ORAL_TABLET | Freq: Every day | ORAL | 0 refills | Status: AC
Start: 2024-03-20 — End: ?
  Filled 2024-03-20: qty 30, 30d supply, fill #0

## 2024-03-29 ENCOUNTER — Telehealth: Payer: Self-pay

## 2024-03-29 ENCOUNTER — Other Ambulatory Visit: Payer: Self-pay

## 2024-03-29 DIAGNOSIS — N6310 Unspecified lump in the right breast, unspecified quadrant: Secondary | ICD-10-CM

## 2024-03-29 NOTE — Telephone Encounter (Signed)
 Patient called and stated she had an accident at work on 03/21/2024 while carrying 5 hard plastic totes, resulting in a purplish bruise on right breast. Patient stated she went to urgent care on 03/22/2024, was told to follow-up on 03/30/2024. Patient stated she is scheduled to come to [facility] on 04/13/2024, needs to know if she keep follow-up appointment at urgent care/pcp as scheduled. Patient informed we can not tell her not to go to follow-up appointment, should follow-up as directed by urgent care/pcp. Patient verbalized understanding.

## 2024-04-12 NOTE — Progress Notes (Unsigned)
 [name] is a 58 y.o. female who presents to [facility] clinic today with {Blank single:19197::no complaints,complaint of} ***.    Pap Smear: Pap smear not completed today. Last Pap smear was 07/22/2023 at [facility] clinic and was normal with negative HPV. Per patient has no history of an abnormal Pap smear. Last Pap smear result is available in Epic.   Physical exam: Breasts Breasts symmetrical. No skin abnormalities bilateral breasts. A small blackhead bump observed center of chest that per patient has decreased in size from last year. No nipple retraction bilateral breasts. No nipple discharge bilateral breasts. No lymphadenopathy. No lumps palpated bilateral breasts. No complaints of pain or tenderness on exam.    MS 3D SCR MAMMO BILAT BR (aka MM) Result Date: 07/27/2023 CLINICAL DATA:  Screening. EXAM: DIGITAL SCREENING BILATERAL MAMMOGRAM WITH TOMOSYNTHESIS AND CAD TECHNIQUE: Bilateral screening digital craniocaudal and mediolateral oblique mammograms were obtained. Bilateral screening digital breast tomosynthesis was performed. The images were evaluated with computer-aided detection. COMPARISON:  Previous exam(s). ACR Breast Density Category b: There are scattered areas of fibroglandular density. FINDINGS: There are no findings suspicious for malignancy. IMPRESSION: No mammographic evidence of malignancy. A result letter of this screening mammogram will be mailed directly to the patient. RECOMMENDATION: Screening mammogram in one year. (Code:SM-B-01Y) BI-RADS CATEGORY  1: Negative. Electronically Signed   By: Delon Music M.D.   On: 07/27/2023 15:14   MS DIGITAL SCREENING TOMO BILATERAL Result Date: 07/20/2022 CLINICAL DATA:  Screening. EXAM: DIGITAL SCREENING BILATERAL MAMMOGRAM WITH TOMOSYNTHESIS AND CAD TECHNIQUE: Bilateral screening digital craniocaudal and mediolateral oblique mammograms were obtained. Bilateral screening digital breast tomosynthesis was performed. The images  were evaluated with computer-aided detection. COMPARISON:  Previous exam(s). ACR Breast Density Category b: There are scattered areas of fibroglandular density. FINDINGS: There are no findings suspicious for malignancy. IMPRESSION: No mammographic evidence of malignancy. A result letter of this screening mammogram will be mailed directly to the patient. RECOMMENDATION: Screening mammogram in one year. (Code:SM-B-01Y) BI-RADS CATEGORY  1: Negative. Electronically Signed   By: Leita Mattocks M.D.   On: 07/20/2022 17:15   MS DIGITAL DIAG TOMO UNI LEFT Result Date: 07/07/2021 CLINICAL DATA:  58 year old female presenting as a recall from screening for possible left breast asymmetry. EXAM: DIGITAL DIAGNOSTIC UNILATERAL LEFT MAMMOGRAM WITH TOMOSYNTHESIS AND CAD TECHNIQUE: Left digital diagnostic mammography and breast tomosynthesis was performed. The images were evaluated with computer-aided detection. COMPARISON:  Previous exam(s). ACR Breast Density Category c: The breast tissue is heterogeneously dense, which may obscure small masses. FINDINGS: Spot compression tomosynthesis MLO and full field mL tomosynthesis views of the left breast were performed for a questioned asymmetry seen only on MLO view in the retroareolar left breast. On the additional imaging the tissue in this area disperses without persistent asymmetry, mass, or distortion. There are no new findings elsewhere in the left breast. IMPRESSION: No mammographic evidence of malignancy in the left breast. RECOMMENDATION: Screening mammogram in one year.(Code:SM-B-01Y) I have discussed the findings and recommendations with the patient. If applicable, a reminder letter will be sent to the patient regarding the next appointment. BI-RADS CATEGORY  1: Negative. Electronically Signed   By: Inocente Ast M.D.   On: 07/07/2021 09:42   MM 3D SCREEN BREAST BILATERAL Result Date: 06/25/2021 CLINICAL DATA:  Screening. EXAM: DIGITAL SCREENING BILATERAL MAMMOGRAM  WITH TOMOSYNTHESIS AND CAD TECHNIQUE: Bilateral screening digital craniocaudal and mediolateral oblique mammograms were obtained. Bilateral screening digital breast tomosynthesis was performed. The images were evaluated with computer-aided detection. COMPARISON:  Previous exam(s). ACR Breast Density Category c: The breast tissue is heterogeneously dense, which may obscure small masses. FINDINGS: In the left breast, a possible asymmetry warrants further evaluation. In the right breast, no findings suspicious for malignancy. IMPRESSION: Further evaluation is suggested for possible asymmetry in the left breast. RECOMMENDATION: Diagnostic mammogram and possibly ultrasound of the left breast. (Code:[id number]) The patient will be contacted regarding the findings, and additional imaging will be scheduled. BI-RADS CATEGORY  0: Incomplete. Need additional imaging evaluation and/or prior mammograms for comparison. Electronically Signed   By: Craig Farr M.D.   On: 06/25/2021 13:55   MS DIGITAL DIAG TOMO UNI LEFT Result Date: 07/05/2020 CLINICAL DATA:  Possible asymmetry in the outer left breast in the craniocaudal projection of the recent screening mammogram. EXAM: DIGITAL DIAGNOSTIC UNILATERAL LEFT MAMMOGRAM WITH TOMO AND CAD TECHNIQUE: Left digital diagnostic mammography and breast tomosynthesis was performed. Digital images of the breasts were evaluated with computer-aided detection. COMPARISON:  Previous exam(s). ACR Breast Density Category c: The breast tissue is heterogeneously dense, which may obscure small masses. FINDINGS: 3D tomographic and 2D generated true lateral and spot compression craniocaudal images of the left breast demonstrate normal appearing fibroglandular tissue at the location of the recently suspected asymmetry, unchanged compared previous examinations. IMPRESSION: No evidence of malignancy. The recently suspected left breast asymmetry was close apposition of normal breast tissue. RECOMMENDATION:  Bilateral screening mammogram in 1 year. I have discussed the findings and recommendations with the patient. If applicable, a reminder letter will be sent to the patient regarding the next appointment. BI-RADS CATEGORY  1: Negative. Electronically Signed   By: Elspeth Bathe M.D.   On: 07/05/2020 09:44   MS DIGITAL SCREENING TOMO BILATERAL Result Date: 06/14/2020 CLINICAL DATA:  Screening. EXAM: DIGITAL SCREENING BILATERAL MAMMOGRAM WITH TOMO AND CAD COMPARISON:  Previous exam(s). ACR Breast Density Category c: The breast tissue is heterogeneously dense, which may obscure small masses. FINDINGS: In the left breast, a possible asymmetry warrants further evaluation. In the right breast, no findings suspicious for malignancy. Images were processed with CAD. IMPRESSION: Further evaluation is suggested for possible asymmetry in the left breast. RECOMMENDATION: Diagnostic mammogram and possibly ultrasound of the left breast. (Code:[id number]) The patient will be contacted regarding the findings, and additional imaging will be scheduled. BI-RADS CATEGORY  0: Incomplete. Need additional imaging evaluation and/or prior mammograms for comparison. Electronically Signed   By: Toribio Agreste M.D.   On: 06/14/2020 07:53   MS DIGITAL DIAG TOMO BILAT Result Date: 05/02/2019 CLINICAL DATA:  58 year old female with history of infected/inflamed sebaceous cysts presents with palpable area of concern in the left axilla which she no longer feels. EXAM: DIGITAL DIAGNOSTIC BILATERAL MAMMOGRAM WITH CAD AND TOMO BILATERAL BREAST ULTRASOUND COMPARISON:  Previous exam(s). ACR Breast Density Category c: The breast tissue is heterogeneously dense, which may obscure small masses. FINDINGS: There is an asymmetry within the upper slightly inner right breast measuring approximately 0.8 cm. No suspicious masses or calcifications are seen in the left breast. Mammographic images were processed with CAD. Targeted ultrasound of the upper and on upper  inner right breast was performed demonstrating several small cysts and clusters of cysts. A cluster of cysts at 1 o'clock 6 cm from nipple measures 0.4 x 0.2 x 0.8 cm. This is felt to correspond well with the asymmetry within the upper slightly inner right breast at mammography. Targeted ultrasound of the left axilla was performed. No suspicious masses or abnormality seen, only scarring related to patient's prior excision  in this location. IMPRESSION: No findings of malignancy in either breast. RECOMMENDATION: Screening mammogram in one year.(Code:[id number]-B-01Y) I have discussed the findings and recommendations with the patient. If applicable, a reminder letter will be sent to the patient regarding the next appointment. BI-RADS CATEGORY  2: Benign. Electronically Signed   By: Delon Music M.D.   On: 05/02/2019 10:41       Pelvic/Bimanual Pap is not indicated today per BCCCP guidelines.   Smoking History: Patient is former smoker that quit 09/24/1994.    Patient Navigation: Patient education provided. Access to services provided for patient through [facility] program. *** interpreter provided. *** transportation provided   Colorectal Cancer Screening: Per patient had a colonoscopy completed 29 years ago. FIT Test completed 03/17/2023, 06/17/2021, 08/29/2019, and 03/14/2018 and negative. No complaints today.    Breast and Cervical Cancer Risk Assessment: Patient has a family history of her mother and a paternal aunt having breast cancer. Patient has no known genetic mutations or history of radiation treatment to the chest before age 27. Patient does not have history of cervical dysplasia, immunocompromised, or DES exposure in-utero.   Risk Scores as of Encounter on 04/13/2024     Alisa as of 07/22/2023           5-year 1.02%   Lifetime 6.88%            Last calculated by Silas, Ansyi K, CMA on 07/22/2023 at  9:41 AM        A: BCCCP exam without pap smear Complaint of ***  P: Referred patient  to the [facility] for a diagnostic mammogram. Appointment scheduled Thursday, April 13, 2024 at 0950.  Driscilla Wanda SQUIBB, RN 04/12/2024 11:48 AM

## 2024-04-13 ENCOUNTER — Other Ambulatory Visit (HOSPITAL_COMMUNITY): Payer: Self-pay

## 2024-04-13 ENCOUNTER — Ambulatory Visit: Payer: Self-pay | Admitting: *Deleted

## 2024-04-13 ENCOUNTER — Other Ambulatory Visit: Payer: Self-pay | Admitting: Internal Medicine

## 2024-04-13 ENCOUNTER — Other Ambulatory Visit: Payer: Self-pay

## 2024-04-13 ENCOUNTER — Ambulatory Visit
Admission: RE | Admit: 2024-04-13 | Discharge: 2024-04-13 | Disposition: A | Discharge status: Home / Self Care | Payer: Self-pay | Source: Ambulatory Visit | Attending: Obstetrics and Gynecology | Admitting: Obstetrics and Gynecology

## 2024-04-13 ENCOUNTER — Ambulatory Visit
Admission: RE | Admit: 2024-04-13 | Discharge: 2024-04-13 | Disposition: A | Payer: Self-pay | Source: Ambulatory Visit | Attending: Obstetrics and Gynecology | Admitting: Obstetrics and Gynecology

## 2024-04-13 VITALS — Wt 213.0 lb

## 2024-04-13 DIAGNOSIS — E669 Obesity, unspecified: Secondary | ICD-10-CM

## 2024-04-13 DIAGNOSIS — Z1239 Encounter for other screening for malignant neoplasm of breast: Secondary | ICD-10-CM

## 2024-04-13 DIAGNOSIS — N6310 Unspecified lump in the right breast, unspecified quadrant: Secondary | ICD-10-CM

## 2024-04-13 DIAGNOSIS — N6315 Unspecified lump in the right breast, overlapping quadrants: Secondary | ICD-10-CM

## 2024-04-13 DIAGNOSIS — N644 Mastodynia: Secondary | ICD-10-CM

## 2024-04-13 DIAGNOSIS — Z1211 Encounter for screening for malignant neoplasm of colon: Secondary | ICD-10-CM

## 2024-04-13 NOTE — Patient Instructions (Signed)
 Explained breast self awareness with Bartholome Ledger. Patient did not need a Pap smear today due to last Pap smear and HPV typing was 07/22/2023. Let her know BCCCP will cover Pap smears and HPV typing every 5 years unless has a history of abnormal Pap smears. Referred patient to the [facility] for a diagnostic mammogram. Appointment scheduled Thursday, April 13, 2024 at 0950. Patient aware of appointment and will be there. Alexis Mooney verbalized understanding.  [name], [name], RN 8:49 AM

## 2024-04-14 ENCOUNTER — Other Ambulatory Visit: Payer: Self-pay

## 2024-04-19 ENCOUNTER — Other Ambulatory Visit: Payer: Self-pay

## 2024-04-24 ENCOUNTER — Other Ambulatory Visit: Payer: Self-pay

## 2024-04-26 ENCOUNTER — Other Ambulatory Visit: Payer: Self-pay

## 2024-04-26 ENCOUNTER — Telehealth: Payer: Self-pay | Admitting: Internal Medicine

## 2024-04-26 NOTE — Telephone Encounter (Unsigned)
 Copied from CRM [id number]. Topic: Appointments - Scheduling Inquiry for Clinic >> Apr 26, 2024  1:02 PM Tiffini S wrote: Reason for CRM: Patient called about medication refill for Dulaglutide  (TRULICITY ) 0.75 MG/0.5ML SOAJ- she have taken her last pen and must take the next injection on 04/29/24  patient received a message stating that a appointment is needed for a medication refill/ she was offered a appointment with pcp and other providers in person and video/ patienr refused stating that she works and is not available for the appointments but still need the prescription    She is asking for a call back at [phone] to discuss

## 2024-04-27 ENCOUNTER — Other Ambulatory Visit: Payer: Self-pay | Admitting: Physician Assistant

## 2024-04-27 ENCOUNTER — Other Ambulatory Visit: Payer: Self-pay

## 2024-04-27 ENCOUNTER — Telehealth: Payer: Self-pay | Admitting: Internal Medicine

## 2024-04-27 DIAGNOSIS — E669 Obesity, unspecified: Secondary | ICD-10-CM

## 2024-04-27 MED ORDER — TRULICITY 0.75 MG/0.5ML ~~LOC~~ SOAJ
0.7500 mg | SUBCUTANEOUS | 0 refills | Status: DC
  Filled 2024-04-27: qty 2, 28d supply, fill #0

## 2024-04-28 ENCOUNTER — Other Ambulatory Visit: Payer: Self-pay

## 2024-05-01 ENCOUNTER — Ambulatory Visit: Admitting: Physician Assistant

## 2024-05-01 NOTE — Telephone Encounter (Unsigned)
 Copied from CRM #[id number]. Topic: Appointments - Appointment Cancel/Reschedule >> May 01, 2024  8:55 AM Cleave MATSU wrote: Patient/patient representative is calling to cancel or reschedule an appointment. Refer to attachments for appointment information. Pt wants to reschedule appt for today please callback and assist

## 2024-05-18 ENCOUNTER — Other Ambulatory Visit: Payer: Self-pay

## 2024-05-18 ENCOUNTER — Other Ambulatory Visit (HOSPITAL_COMMUNITY)
Admission: RE | Admit: 2024-05-18 | Discharge: 2024-05-18 | Disposition: A | Discharge status: Home / Self Care | Payer: Self-pay | Source: Ambulatory Visit | Attending: Internal Medicine | Admitting: Internal Medicine

## 2024-05-18 ENCOUNTER — Ambulatory Visit: Payer: Self-pay | Attending: Internal Medicine | Admitting: Internal Medicine

## 2024-05-18 ENCOUNTER — Encounter: Payer: Self-pay | Admitting: Internal Medicine

## 2024-05-18 ENCOUNTER — Other Ambulatory Visit: Payer: Self-pay | Admitting: Pharmacist

## 2024-05-18 DIAGNOSIS — M130 Polyarthritis, unspecified: Secondary | ICD-10-CM

## 2024-05-18 DIAGNOSIS — Z7984 Long term (current) use of oral hypoglycemic drugs: Secondary | ICD-10-CM

## 2024-05-18 DIAGNOSIS — M2041 Other hammer toe(s) (acquired), right foot: Secondary | ICD-10-CM

## 2024-05-18 DIAGNOSIS — Z1211 Encounter for screening for malignant neoplasm of colon: Secondary | ICD-10-CM

## 2024-05-18 DIAGNOSIS — I152 Hypertension secondary to endocrine disorders: Secondary | ICD-10-CM

## 2024-05-18 DIAGNOSIS — E1169 Type 2 diabetes mellitus with other specified complication: Secondary | ICD-10-CM

## 2024-05-18 DIAGNOSIS — Z2821 Immunization not carried out because of patient refusal: Secondary | ICD-10-CM

## 2024-05-18 DIAGNOSIS — N898 Other specified noninflammatory disorders of vagina: Secondary | ICD-10-CM

## 2024-05-18 DIAGNOSIS — E119 Type 2 diabetes mellitus without complications: Secondary | ICD-10-CM

## 2024-05-18 DIAGNOSIS — Z23 Encounter for immunization: Secondary | ICD-10-CM

## 2024-05-18 DIAGNOSIS — E785 Hyperlipidemia, unspecified: Secondary | ICD-10-CM

## 2024-05-18 DIAGNOSIS — Z7985 Long-term (current) use of injectable non-insulin antidiabetic drugs: Secondary | ICD-10-CM

## 2024-05-18 DIAGNOSIS — N760 Acute vaginitis: Secondary | ICD-10-CM

## 2024-05-18 DIAGNOSIS — M2042 Other hammer toe(s) (acquired), left foot: Secondary | ICD-10-CM

## 2024-05-18 DIAGNOSIS — I10 Essential (primary) hypertension: Secondary | ICD-10-CM

## 2024-05-18 LAB — POCT GLYCOSYLATED HEMOGLOBIN (HGB A1C): HbA1c, POC (controlled diabetic range): 8.4 % — AB (ref 0.0–7.0)

## 2024-05-18 LAB — GLUCOSE, POCT (MANUAL RESULT ENTRY): POC Glucose: 126 mg/dL — AB (ref 70–99)

## 2024-05-18 MED ORDER — FLUCONAZOLE 150 MG PO TABS
150.0000 mg | ORAL_TABLET | Freq: Every day | ORAL | 0 refills | Status: DC
  Filled 2024-05-18: qty 2, 2d supply, fill #0

## 2024-05-18 MED ORDER — DOXYCYCLINE HYCLATE 100 MG PO TABS
100.0000 mg | ORAL_TABLET | Freq: Two times a day (BID) | ORAL | 0 refills | Status: AC
  Filled 2024-05-18: qty 14, 7d supply, fill #0

## 2024-05-18 MED ORDER — ATORVASTATIN CALCIUM 40 MG PO TABS
40.0000 mg | ORAL_TABLET | Freq: Every day | ORAL | 6 refills | Status: AC
  Filled 2024-05-18 – 2024-06-15 (×2): qty 30, 30d supply, fill #0

## 2024-05-18 MED ORDER — GLIPIZIDE 5 MG PO TABS
5.0000 mg | ORAL_TABLET | Freq: Two times a day (BID) | ORAL | 1 refills | Status: AC
  Filled 2024-05-18: qty 180, 90d supply, fill #0

## 2024-05-18 MED ORDER — AMLODIPINE BESYLATE 5 MG PO TABS
5.0000 mg | ORAL_TABLET | Freq: Every day | ORAL | 1 refills | Status: AC
  Filled 2024-05-18 – 2024-06-15 (×2): qty 90, 90d supply, fill #0

## 2024-05-18 MED ORDER — MELOXICAM 15 MG PO TABS
15.0000 mg | ORAL_TABLET | Freq: Every day | ORAL | 2 refills | Status: AC
  Filled 2024-05-18 (×2): qty 90, 90d supply, fill #0

## 2024-05-18 MED ORDER — MELOXICAM 15 MG PO TABS: 15.0000 mg | ORAL_TABLET | Freq: Every day | ORAL | 2 refills | Status: DC

## 2024-05-18 MED ORDER — TRULICITY 1.5 MG/0.5ML ~~LOC~~ SOAJ
1.5000 mg | SUBCUTANEOUS | 5 refills | Status: AC
  Filled 2024-05-18: qty 2, 28d supply, fill #0
  Filled 2024-06-15: qty 2, 28d supply, fill #1
  Filled 2024-07-07: qty 2, 28d supply, fill #2
  Filled ????-??-??: fill #2

## 2024-05-18 NOTE — Progress Notes (Signed)
 Patient ID: Alexis Mooney, female    DOB: 1966/03/01  MRN: [id number]  CC: Diabetes (DM f/u. Med refills. [place] / discomfort in knuckles - gabapentin no longer alleviating /Socks are leaving indentations in skin /No to flu vax)   Subjective: Alexis Mooney is a 58 y.o. female who presents for chronic ds management. Her concerns today include:  History of DM type 2, HTN, HL, morbid obesity, osteoarthritis of the knees,      Discussed the use of AI scribe software for clinical note transcription with the patient, who gave verbal consent to proceed.  History of Present Illness Alexis Mooney is a 58 year old female with diabetes, hypertension, hyperlipidemia, and arthritis who presents for follow-up of her chronic medical conditions.  DM: Results for orders placed or performed in visit on 05/18/24  POCT glucose (manual entry)   Collection Time: 05/18/24  9:16 AM  Result Value Ref Range   POC Glucose 126 (A) 70 - 99 mg/dl  POCT glycosylated hemoglobin (Hb A1C)   Collection Time: 05/18/24  9:17 AM  Result Value Ref Range   Hemoglobin A1C     HbA1c POC (<> result, manual entry)     HbA1c, POC (prediabetic range)     HbA1c, POC (controlled diabetic range) 8.4 (A) 0.0 - 7.0 %   She has seen an improvement in her diabetes management, with her A1c decreasing from 9.8 to 8.4, although it remains above the target of less than 7. Her blood sugar was 126. She is inconsistently using her oral medications, glipizide  and metformin , but is trying to improve adherence by using a pill box. She has been taking Trulicity  once a week since July, which has decreased her appetite but has not resulted in weight loss. She experiences mild nausea occasionally, similar to previous medications. She is not regularly checking her blood sugars but has the device to do so. Her diet excludes sugary snacks and fast food, and she prefers water and blended fruits over juices.  HTN: She is  taking amlodipine  5 mg daily for blood pressure, and her most recent reading was 115/77. She does not add salt to her food and rarely consumes salty snacks. She stands for long hours at work and notices deep sock marks around the top of her ankle socks, raising concerns about fluid retention or circulation issues.  HL: She continues atorvastatin  40 mg daily. Her last cholesterol check was in October of the previous year, with an LDL of 75. She is prepared to have her blood tests done today.  OA multiple jts: She takes meloxicam  15 mg daily for arthritis but reports increased stiffness and pain in her fingers over the past two weeks. No swelling. She performs finger exercises in the morning to alleviate stiffness  She reports the development of pimples around her vaginal area over the past three weeks, which are sometimes painful and discharge pus. She experiences occasional vaginal itching and uses an over-the-counter vaginal cream. Some yellow/white vaginal discharge lately. Active with her husband only.    Patient Active Problem List   Diagnosis Date Noted   Influenza vaccine refused 05/23/2020   Bunion of great toe 05/23/2020   23-polyvalent pneumococcal polysaccharide vaccine declined 05/23/2020   Hyperlipidemia associated with type 2 diabetes mellitus (HCC) 07/11/2019   Type 2 diabetes mellitus without complication, without long-term current use of insulin (HCC) 07/11/2019   Screening breast examination 04/25/2019   Axillary lump, left 04/25/2019   Frequency of urination 01/31/2019   Dysuria  01/31/2019   Epigastric pain 01/31/2019   Primary osteoarthritis of both knees 07/06/2018   Memory disorder 02/04/2018   Anxiety and depression 03/19/2017   Hair loss 03/19/2017   High triglycerides 12/29/2016   Perimenopause 01/22/2014   Essential hypertension 01/22/2014     Medications Ordered Prior to Encounter[1]  Allergies[2]  Social History   Socioeconomic History   Marital  status: Single    Spouse name: Not on file   Number of children: 0   Years of education: Not on file   Highest education level: Bachelor's degree (e.g., BA, AB, BS)  Occupational History   Not on file  Tobacco Use   Smoking status: Former    Current packs/day: 0.00    Types: Cigarettes    Quit date: 09/24/1994    Years since quitting: 29.6   Smokeless tobacco: Never  Vaping Use   Vaping status: Never Used  Substance and Sexual Activity   Alcohol  use: Yes    Comment: rarely   Drug use: No   Sexual activity: Yes    Birth control/protection: None, Post-menopausal  Other Topics Concern   Not on file  Social History Narrative   Pt was born in [place]   Social Drivers of Health   Tobacco Use: Medium Risk (04/13/2024)   Patient History    Smoking Tobacco Use: Former    Smokeless Tobacco Use: Never    Passive Exposure: Not on file  Financial Resource Strain: Medium Risk (11/07/2022)   Overall Financial Resource Strain (CARDIA)    Difficulty of Paying Living Expenses: Somewhat hard  Food Insecurity: No Food Insecurity (04/13/2024)   Epic    Worried About Programme Researcher, Broadcasting/film/video in the Last Year: Never true    Ran Out of Food in the Last Year: Never true  Transportation Needs: No Transportation Needs (04/13/2024)   Epic    Lack of Transportation (Medical): No    Lack of Transportation (Non-Medical): No  Physical Activity: Unknown (11/07/2022)   Exercise Vital Sign    Days of Exercise per Week: 0 days    Minutes of Exercise per Session: Not on file  Stress: Stress Concern Present (11/07/2022)   [employer] of Occupational Health - Occupational Stress Questionnaire    Feeling of Stress : To some extent  Social Connections: Moderately Isolated (11/07/2022)   Social Connection and Isolation Panel    Frequency of Communication with Friends and Family: Twice a week    Frequency of Social Gatherings with Friends and Family: Twice a week    Attends Religious Services: Never    Loss Adjuster, Chartered or Organizations: No    Attends Engineer, Structural: Not on file    Marital Status: Married  Catering Manager Violence: Not At Risk (09/24/2022)   Humiliation, Afraid, Rape, and Kick questionnaire    Fear of Current or Ex-Partner: No    Emotionally Abused: No    Physically Abused: No    Sexually Abused: No  Depression (PHQ2-9): Low Risk (01/06/2024)   Depression (PHQ2-9)    PHQ-2 Score: 3  Alcohol  Screen: Low Risk (11/07/2022)   Alcohol  Screen    Last Alcohol  Screening Score (AUDIT): 0  Housing: Low Risk (11/07/2022)   Housing    Last Housing Risk Score: 0  Utilities: Not At Risk (09/24/2022)   AHC Utilities    Threatened with loss of utilities: No  Health Literacy: Not on file    Family History  Problem Relation Age of Onset   Breast  cancer Mother 44   Hypertension Mother    Hypertension Father    Cancer Sister 72       stomach   Breast cancer Paternal Aunt        Pt unsure but was heard this in the family   Hypertension Maternal Grandmother    Hypertension Maternal Grandfather    Breast cancer Cousin        Pt unsure but was heard this in the family    Past Surgical History:  Procedure Laterality Date   ABSCESS DRAINAGE     BREAST BIOPSY Left    US  Core bx, 2010?, pt not sure when    ROS: Review of Systems Negative except as stated above  PHYSICAL EXAM: BP 115/77 (BP Location: Left Arm, Patient Position: Sitting, Cuff Size: Normal)   Pulse 86   Temp 98 F (36.7 C) (Oral)   Ht 5' 5 (1.651 m)   Wt 216 lb (98 kg)   LMP 02/12/2018   SpO2 98%   BMI 35.94 kg/m   Wt Readings from Last 3 Encounters:  05/18/24 216 lb (98 kg)  04/13/24 213 lb (96.6 kg)  01/06/24 213 lb (96.6 kg)    Physical Exam  General appearance - alert, well appearing, older Hispanic female and in no distress Mental status - normal mood, behavior, speech, dress, motor activity, and thought processes Neck - supple, no significant adenopathy Chest - clear to  auscultation, no wheezes, rales or rhonchi, symmetric air entry Heart - normal rate, regular rhythm, normal S1, S2, no murmurs, rubs, clicks or gallops Extremities - peripheral pulses normal, no pedal edema, no clubbing or cyanosis Diabetic Foot Exam - Simple   Simple Foot Form Diabetic Foot exam was performed with the following findings: Yes 05/18/2024 10:03 AM  Visual Inspection See comments: Yes Sensation Testing Intact to touch and monofilament testing bilaterally: Yes Pulse Check Posterior Tibialis and Dorsalis pulse intact bilaterally: Yes Comments Non-inflam bunion both big toe. Hammer toes of 2nd toes.    Addendum to physical exam 05/20/24: Pelvic - CMA Clarisa present. Pt noted to have small resolving abscess about 1 cm in size with surrounding induration but no current drainage on the mid RT labia. Noted to have a small one developing lateral and inferior to the labia on the left side. No erythema; slightly tender to touch.     Latest Ref Rng & Units 03/12/2023   11:44 AM 03/11/2022   11:45 AM 06/17/2021   11:32 AM  CMP  Glucose 70 - 99 mg/dL 826  804  813   BUN 6 - 24 mg/dL 17  11  11    Creatinine 0.57 - 1.00 mg/dL 9.49  9.48  9.49   Sodium 134 - 144 mmol/L 135  137  132   Potassium 3.5 - 5.2 mmol/L 4.4  4.2  4.1   Chloride 96 - 106 mmol/L 98  96  95   CO2 20 - 29 mmol/L 23  22  25    Calcium  8.7 - 10.2 mg/dL 9.1  9.3  9.5   Total Protein 6.0 - 8.5 g/dL 6.6  6.7  6.9   Total Bilirubin 0.0 - 1.2 mg/dL 0.5  0.5  0.5   Alkaline Phos 44 - 121 IU/L 88  100  117   AST 0 - 40 IU/L 18  21  20    ALT 0 - 32 IU/L 21  25  28     Lipid Panel     Component Value Date/Time  CHOL 146 03/12/2023 1144   TRIG 178 (H) 03/12/2023 1144   TRIG 162 (H) 12/11/2015 0906   HDL 41 03/12/2023 1144   CHOLHDL 3.6 03/12/2023 1144   CHOLHDL 4.8 09/25/2014 0912   VLDL 56 (H) 09/25/2014 0912   LDLCALC 75 03/12/2023 1144    CBC    Component Value Date/Time   WBC 5.5 03/12/2023 1144   WBC  6.6 03/18/2010 2119   RBC 4.99 03/12/2023 1144   RBC 4.67 03/18/2010 2119   HGB 14.5 03/12/2023 1144   HCT 44.4 03/12/2023 1144   PLT 284 03/12/2023 1144   MCV 89 03/12/2023 1144   MCH 29.1 03/12/2023 1144   MCH 30.1 12/08/2009 1329   MCHC 32.7 03/12/2023 1144   MCHC 31.7 03/18/2010 2119   RDW 12.3 03/12/2023 1144   LYMPHSABS 2.6 03/18/2010 2119   MONOABS 0.5 03/18/2010 2119   EOSABS 0.2 03/18/2010 2119   BASOSABS 0.0 03/18/2010 2119    ASSESSMENT AND PLAN: 1. Type 2 diabetes mellitus associated with morbid obesity (HCC) (Primary) Not at goal but improved. Trulicity  tolerated with mild nausea, no significant weight loss. - Increased Trulicity  to 1.5 mg weekly.  Advised to watch out for any vomiting, abdominal pain or severe diarrhea/constipation. - Encouraged consistent use of glipizide  and metformin .  As prescribed. - Encouraged daily blood sugar monitoring. - Continue dietary modifications to avoid sugary snacks and fast food. - POCT glucose (manual entry) - POCT glycosylated hemoglobin (Hb A1C) - glipiZIDE  (GLUCOTROL ) 5 MG tablet; Take 1 tablet (5 mg total) by mouth 2 (two) times daily before a meal.  Dispense: 180 tablet; Refill: 1 - Dulaglutide  (TRULICITY ) 1.5 MG/0.5ML SOAJ; Inject 1.5 mg into the skin once a week.  Dispense: 2 mL; Refill: 5 - CBC - Comprehensive metabolic panel with GFR - Lipid panel  2. Diabetes mellitus treated with oral medication (HCC) 3. Long-term (current) use of injectable non-insulin antidiabetic drugs See #1 above.  4. Hypertension associated with type 2 diabetes mellitus (HCC) At goal.  Continue amlodipine  5 mg daily - amLODipine  (NORVASC ) 5 MG tablet; Take 1 tablet (5 mg total) by mouth daily.  Dispense: 90 tablet; Refill: 1  5. Hyperlipidemia associated with type 2 diabetes mellitus (HCC) - atorvastatin  (LIPITOR) 40 MG tablet; Take 1 tablet (40 mg total) by mouth daily.  Dispense: 30 tablet; Refill: 6  6. Polyarthritis Still seems  consistent with OA Looks like she has been out of the meloxicam .  Refill sent. - meloxicam  (MOBIC ) 15 MG tablet; Take 1 tablet (15 mg total) by mouth daily.Please keep upcoming appointment for additional refills.  Dispense: 90 tablet; Refill: 2  7. Vaginal itching Will treat empirically for yeast. - fluconazole  (DIFLUCAN ) 150 MG tablet; Take 1 tablet (150 mg total) by mouth daily.  Dispense: 2 tablet; Refill: 0 - Cervicovaginal ancillary only  8. Abscess of vagina Patient with resolving abscess in the vaginal area on the right side and a small 1 developing lateral and inferior to the labia on the left side.  Will give a course of doxycycline .  Advised that if this increases in size and becomes painful, she should be seen in the emergency room to have it drained. - doxycycline  (VIBRA -TABS) 100 MG tablet; Take 1 tablet (100 mg total) by mouth 2 (two) times daily.  Dispense: 14 tablet; Refill: 0  9. Hammer toes of both feet Patient inquiring about how to have the hammertoes fixed.  Advised to apply for the [facility] financial and once approved let me know  so that we can refer to podiatry  10. Screening for colon cancer Encouraged her to use the fit test that she has at home and turn in as soon as possible to be screened for colon cancer.  She is overdue.  11. Influenza vaccination declined Recommended.  Patient declined.  12. Need for shingles vaccine 13. Need for vaccination against Streptococcus pneumoniae Advised to get these 2 vaccines through the health department for cost effectiveness.   Patient was given the opportunity to ask questions.  Patient verbalized understanding of the plan and was able to repeat key elements of the plan.    This documentation was completed using Paediatric nurse.  Any transcriptional errors are unintentional.  Orders Placed This Encounter  Procedures   POCT glucose (manual entry)   POCT glycosylated hemoglobin (Hb A1C)     Requested  Prescriptions   Pending Prescriptions Disp Refills   amLODipine  (NORVASC ) 5 MG tablet 90 tablet 1    Sig: Take 1 tablet (5 mg total) by mouth daily.   atorvastatin  (LIPITOR) 40 MG tablet 30 tablet 6    Sig: Take 1 tablet (40 mg total) by mouth daily.   Dulaglutide  (TRULICITY ) 0.75 MG/0.5ML SOAJ 2 mL 0    Sig: Inject 0.75 mg into the skin once a week.   glipiZIDE  (GLUCOTROL ) 5 MG tablet 180 tablet 1    Sig: Take 1 tablet (5 mg total) by mouth 2 (two) times daily before a meal.    No follow-ups on file.  Barnie Louder, MD, FACP     [1]  Current Outpatient Medications on File Prior to Visit  Medication Sig Dispense Refill   amLODipine  (NORVASC ) 5 MG tablet Take 1 tablet (5 mg total) by mouth daily. 90 tablet 1   atorvastatin  (LIPITOR) 40 MG tablet Take 1 tablet (40 mg total) by mouth daily. 30 tablet 6   cyclobenzaprine (FLEXERIL) 10 MG tablet Take 10 mg by mouth at bedtime.     Dulaglutide  (TRULICITY ) 0.75 MG/0.5ML SOAJ Inject 0.75 mg into the skin once a week. 2 mL 0   glipiZIDE  (GLUCOTROL ) 5 MG tablet Take 1 tablet (5 mg total) by mouth 2 (two) times daily before a meal. 180 tablet 1   meloxicam  (MOBIC ) 15 MG tablet Take 1 tablet (15 mg total) by mouth daily.Please keep upcoming appointment for additional refills. 30 tablet 0   metFORMIN  (GLUCOPHAGE ) 1000 MG tablet Take 1 tablet (1,000 mg total) by mouth 2 (two) times daily. 180 tablet 1   Multiple Vitamin (MULTIVITAMIN PO) Take by mouth.     traMADol  (ULTRAM ) 50 MG tablet Take 1 tablet (50 mg total) by mouth every 12 (twelve) hours as needed. Use sparingly (Patient not taking: Reported on 05/18/2024) 30 tablet 0   No current facility-administered medications on file prior to visit.  [2]  Allergies Allergen Reactions   Ampicillin Hives   Bactrim  [Sulfamethoxazole -Trimethoprim ] Swelling   Jardiance  [Empagliflozin ]     Recurrent vaginal itching-yeast.   Latex Rash

## 2024-05-18 NOTE — Patient Instructions (Signed)
°  VISIT SUMMARY: Today, we reviewed your chronic conditions, including diabetes, hypertension, hyperlipidemia, and arthritis. We discussed your current medications, recent symptoms, and made adjustments to your treatment plan to better manage your health.  YOUR PLAN: -TYPE 2 DIABETES MELLITUS: Type 2 diabetes is a condition where your body does not use insulin properly, leading to high blood sugar levels. Your A1c has improved but is still above the target. We increased your Trulicity  dose to 1.5 mg weekly and encouraged you to consistently use glipizide  and metformin . Please monitor your blood sugar daily and continue avoiding sugary snacks and fast food.  -POLYARTHRITIS: Polyarthritis is a condition involving pain and stiffness in multiple joints. You reported increased stiffness and pain in your hands. Continue taking meloxicam  15 mg daily, and you may use extra strength Tylenol  for additional pain relief. We have refilled your meloxicam  with a 90-day supply.  -VAGINAL ABSCESSES AND VAGINITIS: Vaginal abscesses are painful, pus-filled bumps that can be caused by ingrown hairs or infections, and vaginitis is inflammation of the vagina. We prescribed Diflucan  for a possible yeast infection and Bactrim  for the abscesses. A vaginal swab was sent for culture. If the abscess on the left side worsens or does not improve with antibiotics, please visit the emergency room to have this drained.  -GENERAL HEALTH MAINTENANCE: You are due for colon cancer screening and shingles and pneumonia vaccines. Please complete the colon cancer screening with the stool kit and obtain the shingles and pneumonia vaccines at the health department.  INSTRUCTIONS: Please follow up with us  after completing your blood tests and if you experience any worsening symptoms or new concerns. Make sure to complete your colon cancer screening and get your shingles and pneumonia vaccines.                      Contains  text generated by [employer]bridge.                                 Contains text generated by Abridge.

## 2024-05-19 LAB — CBC
Hematocrit: 45.4 % (ref 34.0–46.6)
Hemoglobin: 15.1 g/dL (ref 11.1–15.9)
MCH: 29.5 pg (ref 26.6–33.0)
MCHC: 33.3 g/dL (ref 31.5–35.7)
MCV: 89 fL (ref 79–97)
Platelets: 286 x10E3/uL (ref 150–450)
RBC: 5.12 x10E6/uL (ref 3.77–5.28)
RDW: 12.9 % (ref 11.7–15.4)
WBC: 6.2 x10E3/uL (ref 3.4–10.8)

## 2024-05-19 LAB — COMPREHENSIVE METABOLIC PANEL WITH GFR
ALT: 21 IU/L (ref 0–32)
AST: 18 IU/L (ref 0–40)
Albumin: 4.4 g/dL (ref 3.8–4.9)
Alkaline Phosphatase: 90 IU/L (ref 49–135)
BUN/Creatinine Ratio: 36 — ABNORMAL HIGH (ref 9–23)
BUN: 16 mg/dL (ref 6–24)
Bilirubin Total: 0.5 mg/dL (ref 0.0–1.2)
CO2: 25 mmol/L (ref 20–29)
Calcium: 9.3 mg/dL (ref 8.7–10.2)
Chloride: 100 mmol/L (ref 96–106)
Creatinine, Ser: 0.44 mg/dL — ABNORMAL LOW (ref 0.57–1.00)
Globulin, Total: 2.5 g/dL (ref 1.5–4.5)
Glucose: 122 mg/dL — ABNORMAL HIGH (ref 70–99)
Potassium: 4.2 mmol/L (ref 3.5–5.2)
Sodium: 138 mmol/L (ref 134–144)
Total Protein: 6.9 g/dL (ref 6.0–8.5)
eGFR: 112 mL/min/1.73 (ref >59)

## 2024-05-19 LAB — LIPID PANEL
Chol/HDL Ratio: 3.5 ratio (ref 0.0–4.4)
Cholesterol, Total: 165 mg/dL (ref 100–199)
HDL: 47 mg/dL (ref >39)
LDL Chol Calc (NIH): 82 mg/dL (ref 0–99)
Triglycerides: 213 mg/dL — ABNORMAL HIGH (ref 0–149)
VLDL Cholesterol Cal: 36 mg/dL (ref 5–40)

## 2024-05-19 LAB — CERVICOVAGINAL ANCILLARY ONLY
Bacterial Vaginitis (gardnerella): NEGATIVE
Candida Glabrata: NEGATIVE
Candida Vaginitis: NEGATIVE
Chlamydia: NEGATIVE
Comment: NEGATIVE
Comment: NEGATIVE
Comment: NEGATIVE
Comment: NEGATIVE
Comment: NEGATIVE
Comment: NORMAL
Neisseria Gonorrhea: NEGATIVE
Trichomonas: NEGATIVE

## 2024-05-20 ENCOUNTER — Ambulatory Visit: Payer: Self-pay | Admitting: Internal Medicine

## 2024-05-30 ENCOUNTER — Telehealth: Payer: Self-pay

## 2024-05-30 ENCOUNTER — Other Ambulatory Visit: Payer: Self-pay | Admitting: Family Medicine

## 2024-05-30 DIAGNOSIS — L290 Pruritus ani: Secondary | ICD-10-CM

## 2024-05-30 NOTE — Telephone Encounter (Signed)
 Order has been placed.

## 2024-05-30 NOTE — Telephone Encounter (Signed)
 Patient dropped off a stool sample. No order in the system.   Called & spoke to the patient. Verified name & DOB. Inquired if the stool sample was meant to be a colon cancer screening or parasitic testing due to the tubes she dropped off. Patient stated that the test is meant for parasitic testing due to the following symptoms she discussed about with Dr.Johnson - Itchy nose / skin, itchy anus, hair falling, insomnia & sweet cravings for sweets at night X2 mo.   Informed patient that the tubes that was dropped off is only for parasitic testing and not eligible for a FIT test. Patient expressed verbal understanding and stated that she is on her way to another provider where she will be able to do a FIT test and does not need another order to be placed.

## 2024-05-31 NOTE — Telephone Encounter (Signed)
 Lab tech made aware and sample sent off for testing. No further assistance needed at this time.

## 2024-06-01 LAB — SPECIMEN STATUS REPORT

## 2024-06-01 LAB — FECAL OCCULT BLOOD, IMMUNOCHEMICAL: Fecal Occult Bld: NEGATIVE

## 2024-06-02 LAB — OVA AND PARASITE EXAMINATION

## 2024-06-03 ENCOUNTER — Ambulatory Visit: Payer: Self-pay | Admitting: Internal Medicine

## 2024-06-05 ENCOUNTER — Ambulatory Visit: Payer: Self-pay

## 2024-06-15 ENCOUNTER — Other Ambulatory Visit: Payer: Self-pay

## 2024-06-29 ENCOUNTER — Other Ambulatory Visit: Payer: Self-pay | Admitting: Obstetrics and Gynecology

## 2024-06-29 DIAGNOSIS — Z1231 Encounter for screening mammogram for malignant neoplasm of breast: Secondary | ICD-10-CM

## 2024-07-04 ENCOUNTER — Other Ambulatory Visit: Payer: Self-pay

## 2024-07-06 ENCOUNTER — Other Ambulatory Visit: Payer: Self-pay

## 2024-07-07 ENCOUNTER — Other Ambulatory Visit: Payer: Self-pay

## 2024-07-07 ENCOUNTER — Ambulatory Visit: Payer: Self-pay | Attending: Internal Medicine | Admitting: Internal Medicine

## 2024-07-07 ENCOUNTER — Encounter: Payer: Self-pay | Admitting: Internal Medicine

## 2024-07-07 VITALS — BP 114/77 | HR 93 | Temp 98.2°F | Ht 65.0 in | Wt 219.6 lb

## 2024-07-07 DIAGNOSIS — M170 Bilateral primary osteoarthritis of knee: Secondary | ICD-10-CM

## 2024-07-07 DIAGNOSIS — M7051 Other bursitis of knee, right knee: Secondary | ICD-10-CM

## 2024-07-07 DIAGNOSIS — M25511 Pain in right shoulder: Secondary | ICD-10-CM

## 2024-07-07 MED ORDER — TRAMADOL HCL 50 MG PO TABS
50.0000 mg | ORAL_TABLET | Freq: Three times a day (TID) | ORAL | 0 refills | Status: AC
  Filled 2024-07-07 (×2): qty 15, 5d supply, fill #0

## 2024-07-07 NOTE — Patient Instructions (Signed)
" °  VISIT SUMMARY: Today, we discussed your recent acute knee pain and swelling, as well as your ongoing shoulder pain. We reviewed your history of osteoarthritis and the recent exacerbation of symptoms in your knees. We also addressed the persistent pain in your right shoulder following a work-related injury.  YOUR PLAN: -BILATERAL KNEE OSTEOARTHRITIS WITH RIGHT PES ANSERINUS BURSITIS: You have osteoarthritis in both knees, which means the cartilage in your knee joints is wearing down, causing pain and stiffness. Recently, you experienced a flare-up with significant pain and swelling in your right knee, likely due to bursitis, which is inflammation of the bursa near the knee joint. Continue taking meloxicam  15 mg daily for arthritis and use tramadol  50 mg every 12 hours as needed for pain. Apply heat to your knee twice daily. If symptoms persist, we may consider referring you to an orthopedic specialist, depending on the availability of a discount card.  -RIGHT SHOULDER MUSCLE CONTUSION: You have a muscle contusion in your right shoulder, which is a bruise caused by a direct blow or injury. This is likely the result of your work-related injury in November. Apply heat and alternate with cold packs to your shoulder. If the pain continues, we may consider referring you to an orthopedic specialist, depending on the availability of a discount card.  INSTRUCTIONS: Please follow the treatment plan for your knee and shoulder pain. If your symptoms do not improve or worsen, contact our office. Consider an orthopedic referral if symptoms persist and a discount card is available.    Contains text generated by [employer]bridge.   "

## 2024-07-07 NOTE — Progress Notes (Signed)
 "   Patient ID: Alexis Mooney, female    DOB: 25-Jan-1966  MRN: [id number]  CC: Knee Pain (Bilateral knee pain X 3 days/Back pain x 2 weeks/)   Subjective: Alexis Mooney is a 59 y.o. female who presents for chronic ds management. Her chronic medical issues include:  History of DM type 2, HTN, HL, morbid obesity, osteoarthritis of the knees,    Discussed the use of AI scribe software for clinical note transcription with the patient, who gave verbal consent to proceed.  History of Present Illness Alexis Mooney is a 59 year old female with know osteoarthritis of knees who presents with acute knee pain and swelling.  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 The pain was accompanied by significant swelling, and she was unable to bend her knee, sit, or drive. She stayed home from work on yesterday and today due to persistent pain and swelling, although the symptoms have slightly improved with rest. Her job requires her to stand for long periods, which she has been doing for nine years without previous issues.  She has a history of osteoarthritis in both knees. She reports that her knees had not bothered her much lately until this recent episode. She takes meloxicam  daily for arthritis, which had been effective in managing her symptoms until this incident. On Thursday, she began experiencing similar, though less severe, pain in her left knee. She has been mostly resting in bed, only getting up for necessities. Request note for work.  She reports pain in her RT shoulder in the deltoid area due to a work related injury  described as feeling like 'somebody punched me here on the back in the left side.' This pain began after a work-related injury in November 5th of last year. She was carrying 5 plastic totes and almost fell. Trunk was jolted backward then forward with totes  hit the RT breast and shoulder. RT breast was bruised. Report this injury and seen by employer's provider. She had x-rays done at the time, which were negative, and a mammogram that showed a cyst but was otherwise normal. The shoulder pain persists. She reports that she has to be careful when turning or standing up from bed because sometimes it is hard.    Patient Active Problem List   Diagnosis Date Noted   Influenza vaccine refused 05/23/2020   Bunion of great toe 05/23/2020   23-polyvalent pneumococcal polysaccharide vaccine declined 05/23/2020   Hyperlipidemia associated with type 2 diabetes mellitus (HCC) 07/11/2019   Type 2 diabetes mellitus without complication, without long-term current use of insulin (HCC) 07/11/2019   Screening breast examination 04/25/2019   Axillary lump, left 04/25/2019   Frequency of urination 01/31/2019   Dysuria 01/31/2019   Epigastric pain 01/31/2019   Primary osteoarthritis of both knees 07/06/2018   Memory disorder 02/04/2018   Anxiety and depression 03/19/2017   Hair loss 03/19/2017   High triglycerides 12/29/2016   Perimenopause 01/22/2014   Essential hypertension 01/22/2014     Medications Ordered Prior to Encounter[1]  Allergies[2]  Social History   Socioeconomic History   Marital status: Single    Spouse name: Not on file   Number of children: 0   Years of education: Not on file   Highest education level: Bachelor's degree (e.g., BA, AB, BS)  Occupational History   Not on file  Tobacco Use   Smoking  status: Former    Current packs/day: 0.00    Types: Cigarettes    Quit date: 09/24/1994    Years since quitting: 29.8   Smokeless tobacco: Never  Vaping Use   Vaping status: Never Used  Substance and Sexual Activity   Alcohol  use: Yes    Comment: rarely   Drug use: No   Sexual activity: Yes    Birth control/protection: None, Post-menopausal  Other Topics Concern   Not on file  Social History Narrative   Pt was born in [place]    Social Drivers of Health   Tobacco Use: Medium Risk (07/07/2024)   Patient History    Smoking Tobacco Use: Former    Smokeless Tobacco Use: Never    Passive Exposure: Not on file  Financial Resource Strain: Medium Risk (11/07/2022)   Overall Financial Resource Strain (CARDIA)    Difficulty of Paying Living Expenses: Somewhat hard  Food Insecurity: No Food Insecurity (04/13/2024)   Epic    Worried About Programme Researcher, Broadcasting/film/video in the Last Year: Never true    Ran Out of Food in the Last Year: Never true  Transportation Needs: No Transportation Needs (04/13/2024)   Epic    Lack of Transportation (Medical): No    Lack of Transportation (Non-Medical): No  Physical Activity: Unknown (11/07/2022)   Exercise Vital Sign    Days of Exercise per Week: 0 days    Minutes of Exercise per Session: Not on file  Stress: Stress Concern Present (11/07/2022)   [employer] of Occupational Health - Occupational Stress Questionnaire    Feeling of Stress : To some extent  Social Connections: Moderately Isolated (11/07/2022)   Social Connection and Isolation Panel    Frequency of Communication with Friends and Family: Twice a week    Frequency of Social Gatherings with Friends and Family: Twice a week    Attends Religious Services: Never    Database Administrator or Organizations: No    Attends Engineer, Structural: Not on file    Marital Status: Married  Catering Manager Violence: Not At Risk (09/24/2022)   Humiliation, Afraid, Rape, and Kick questionnaire    Fear of Current or Ex-Partner: No    Emotionally Abused: No    Physically Abused: No    Sexually Abused: No  Depression (PHQ2-9): Low Risk (01/06/2024)   Depression (PHQ2-9)    PHQ-2 Score: 3  Alcohol  Screen: Low Risk (11/07/2022)   Alcohol  Screen    Last Alcohol  Screening Score (AUDIT): 0  Housing: Low Risk (11/07/2022)   Housing    Last Housing Risk Score: 0  Utilities: Not At Risk (09/24/2022)   AHC Utilities    Threatened with loss of  utilities: No  Health Literacy: Not on file    Family History  Problem Relation Age of Onset   Breast cancer Mother 44   Hypertension Mother    Hypertension Father    Cancer Sister 3       stomach   Breast cancer Paternal Aunt        Pt unsure but was heard this in the family   Hypertension Maternal Grandmother    Hypertension Maternal Grandfather    Breast cancer Cousin        Pt unsure but was heard this in the family    Past Surgical History:  Procedure Laterality Date   ABSCESS DRAINAGE     BREAST BIOPSY Left    US  Core bx, 2010?, pt not sure when  ROS: Review of Systems Negative except as stated above  PHYSICAL EXAM: BP 114/77   Pulse 93   Temp 98.2 F (36.8 C) (Oral)   Ht 5' 5 (1.651 m)   Wt 219 lb 9.6 oz (99.6 kg)   LMP 02/12/2018   SpO2 98%   BMI 36.54 kg/m   Physical Exam  General appearance - alert, well appearing, and in no distress Mental status - normal mood, behavior, speech, dress, motor activity, and thought processes Musculoskeletal - RT knee: Joint is enlarged with mild tenderness medially.  She has mild to moderate tenderness below the kneecap.  She has discomfort with passive range of motion especially flexion. Left knee: Joint is enlarged.  Mild point tenderness below the kneecap.  Slight discomfort with passive range of motion. Right shoulder.  No edema or erythema noted.  Mild tenderness in the muscle just below the deltoid area.  She has good range of motion.       Latest Ref Rng & Units 05/18/2024   10:35 AM 03/12/2023   11:44 AM 03/11/2022   11:45 AM  CMP  Glucose 70 - 99 mg/dL 877  826  804   BUN 6 - 24 mg/dL 16  17  11    Creatinine 0.57 - 1.00 mg/dL 9.55  9.49  9.48   Sodium 134 - 144 mmol/L 138  135  137   Potassium 3.5 - 5.2 mmol/L 4.2  4.4  4.2   Chloride 96 - 106 mmol/L 100  98  96   CO2 20 - 29 mmol/L 25  23  22    Calcium  8.7 - 10.2 mg/dL 9.3  9.1  9.3   Total Protein 6.0 - 8.5 g/dL 6.9  6.6  6.7   Total  Bilirubin 0.0 - 1.2 mg/dL 0.5  0.5  0.5   Alkaline Phos 49 - 135 IU/L 90  88  100   AST 0 - 40 IU/L 18  18  21    ALT 0 - 32 IU/L 21  21  25     Lipid Panel     Component Value Date/Time   CHOL 165 05/18/2024 1035   TRIG 213 (H) 05/18/2024 1035   TRIG 162 (H) 12/11/2015 0906   HDL 47 05/18/2024 1035   CHOLHDL 3.5 05/18/2024 1035   CHOLHDL 4.8 09/25/2014 0912   VLDL 56 (H) 09/25/2014 0912   LDLCALC 82 05/18/2024 1035    CBC    Component Value Date/Time   WBC 6.2 05/18/2024 1035   WBC 6.6 03/18/2010 2119   RBC 5.12 05/18/2024 1035   RBC 4.67 03/18/2010 2119   HGB 15.1 05/18/2024 1035   HCT 45.4 05/18/2024 1035   PLT 286 05/18/2024 1035   MCV 89 05/18/2024 1035   MCH 29.5 05/18/2024 1035   MCH 30.1 12/08/2009 1329   MCHC 33.3 05/18/2024 1035   MCHC 31.7 03/18/2010 2119   RDW 12.9 05/18/2024 1035   LYMPHSABS 2.6 03/18/2010 2119   MONOABS 0.5 03/18/2010 2119   EOSABS 0.2 03/18/2010 2119   BASOSABS 0.0 03/18/2010 2119    ASSESSMENT AND PLAN: 1. Primary osteoarthritis of both knees (Primary) 2. Pes anserinus bursitis of right knee -Continue meloxicam . Given short course of tramadol  to use as needed.  She has used tramadol  in the past without significant side effects. NCCSRS reviewed. -Advised to use of a heating pad to the right knee. Patient has purchased an over-the-counter pain patch that has menthol in it.  She wanted to know if it is okay  for her to use it on the knee joint.  I told her that that should be fine. -Advised to apply for the orange card/cone discount so that we can refer to orthopedics if things do not improve back to her baseline. -Note given keeping her out of work from yesterday until February 1.  She can return to work on February 2. - traMADol  (ULTRAM ) 50 MG tablet; Take 1 tablet (50 mg total) by mouth every 8 (eight) hours. Use sparingly  Dispense: 15 tablet; Refill: 0  3. Acute pain of right shoulder Persistent pain likely due to muscle contusion.  X-rays negative for fractures Advised to alternate heat and ice.    Patient was given the opportunity to ask questions.  Patient verbalized understanding of the plan and was able to repeat key elements of the plan.   This documentation was completed using Paediatric nurse.  Any transcriptional errors are unintentional.  No orders of the defined types were placed in this encounter.    Requested Prescriptions   Signed Prescriptions Disp Refills   traMADol  (ULTRAM ) 50 MG tablet 15 tablet 0    Sig: Take 1 tablet (50 mg total) by mouth every 8 (eight) hours. Use sparingly    No follow-ups on file.  Barnie Louder, MD, FACP    [1]  Current Outpatient Medications on File Prior to Visit  Medication Sig Dispense Refill   amLODipine  (NORVASC ) 5 MG tablet Take 1 tablet (5 mg total) by mouth daily. 90 tablet 1   atorvastatin  (LIPITOR) 40 MG tablet Take 1 tablet (40 mg total) by mouth daily. 30 tablet 6   Dulaglutide  (TRULICITY ) 1.5 MG/0.5ML SOAJ Inject 1.5 mg into the skin once a week. 2 mL 5   glipiZIDE  (GLUCOTROL ) 5 MG tablet Take 1 tablet (5 mg total) by mouth 2 (two) times daily before a meal. 180 tablet 1   meloxicam  (MOBIC ) 15 MG tablet Take 1 tablet (15 mg total) by mouth daily.Please keep upcoming appointment for additional refills. 90 tablet 2   metFORMIN  (GLUCOPHAGE ) 1000 MG tablet Take 1 tablet (1,000 mg total) by mouth 2 (two) times daily. 180 tablet 1   Multiple Vitamin (MULTIVITAMIN PO) Take by mouth.     cyclobenzaprine (FLEXERIL) 10 MG tablet Take 10 mg by mouth at bedtime. (Patient not taking: Reported on 07/07/2024)     doxycycline  (VIBRA -TABS) 100 MG tablet Take 1 tablet (100 mg total) by mouth 2 (two) times daily. (Patient not taking: Reported on 07/07/2024) 14 tablet 0   No current facility-administered medications on file prior to visit.  [2]  Allergies Allergen Reactions   Ampicillin Hives   Bactrim  [Sulfamethoxazole -Trimethoprim ] Swelling    Jardiance  [Empagliflozin ]     Recurrent vaginal itching-yeast.   Latex Rash   "

## 2024-08-01 ENCOUNTER — Ambulatory Visit

## 2024-08-01 ENCOUNTER — Encounter

## 2024-09-18 ENCOUNTER — Ambulatory Visit: Payer: Self-pay | Admitting: Internal Medicine
